# Patient Record
Sex: Male | Born: 1950 | Race: White | Hispanic: No | Smoking: Former smoker
Health system: Southern US, Community
[De-identification: ages and names within clinical notes are randomized; demographics above are authoritative.]

## PROBLEM LIST (undated history)

## (undated) DIAGNOSIS — K589 Irritable bowel syndrome without diarrhea: Secondary | ICD-10-CM

## (undated) DIAGNOSIS — E785 Hyperlipidemia, unspecified: Secondary | ICD-10-CM

## (undated) DIAGNOSIS — C4491 Basal cell carcinoma of skin, unspecified: Secondary | ICD-10-CM

## (undated) DIAGNOSIS — K219 Gastro-esophageal reflux disease without esophagitis: Secondary | ICD-10-CM

## (undated) DIAGNOSIS — K2970 Gastritis, unspecified, without bleeding: Secondary | ICD-10-CM

## (undated) DIAGNOSIS — K449 Diaphragmatic hernia without obstruction or gangrene: Secondary | ICD-10-CM

## (undated) DIAGNOSIS — K861 Other chronic pancreatitis: Secondary | ICD-10-CM

## (undated) DIAGNOSIS — I10 Essential (primary) hypertension: Secondary | ICD-10-CM

## (undated) DIAGNOSIS — K8681 Exocrine pancreatic insufficiency: Secondary | ICD-10-CM

## (undated) DIAGNOSIS — A6920 Lyme disease, unspecified: Secondary | ICD-10-CM

## (undated) DIAGNOSIS — B356 Tinea cruris: Secondary | ICD-10-CM

## (undated) HISTORY — DX: Exocrine pancreatic insufficiency: K86.81

## (undated) HISTORY — DX: Essential (primary) hypertension: I10

## (undated) HISTORY — DX: Gastritis, unspecified, without bleeding: K29.70

## (undated) HISTORY — DX: Diaphragmatic hernia without obstruction or gangrene: K44.9

## (undated) HISTORY — PX: UPPER GASTROINTESTINAL ENDOSCOPY: SHX188

## (undated) HISTORY — DX: Basal cell carcinoma of skin, unspecified: C44.91

## (undated) HISTORY — PX: EXCISIONAL HEMORRHOIDECTOMY: SHX1541

## (undated) HISTORY — PX: TONSILLECTOMY AND ADENOIDECTOMY: SHX28

## (undated) HISTORY — DX: Other chronic pancreatitis: K86.1

## (undated) HISTORY — DX: Lyme disease, unspecified: A69.20

## (undated) HISTORY — PX: COLONOSCOPY: SHX174

## (undated) HISTORY — DX: Tinea cruris: B35.6

## (undated) HISTORY — DX: Hyperlipidemia, unspecified: E78.5

## (undated) HISTORY — DX: Gastro-esophageal reflux disease without esophagitis: K21.9

---

## 2015-05-18 DIAGNOSIS — A6920 Lyme disease, unspecified: Secondary | ICD-10-CM

## 2015-05-18 HISTORY — DX: Lyme disease, unspecified: A69.20

## 2015-06-06 ENCOUNTER — Telehealth: Payer: Self-pay | Admitting: Gastroenterology

## 2015-06-06 NOTE — Telephone Encounter (Signed)
Pt called asking to speak with GF. I told him that GF was on the other line and was there something that I could help him with. Patient said that he wanted GF to call him back and gave me his contact number [phone].  Patient has an OV on 7/13 at 3 with [facility] as a new patient with [facility] for rectal discharge, but I didn't see referral from PCP or notes in epic. So, I didn't know if I needed to be requesting any before his OV or not. Please advise and call patient back.

## 2015-06-07 NOTE — Telephone Encounter (Signed)
Talked with him and he is aware of his appointment with [facility]

## 2015-06-14 ENCOUNTER — Ambulatory Visit: Payer: Self-pay | Admitting: Nurse Practitioner

## 2015-06-29 ENCOUNTER — Encounter: Payer: Self-pay | Admitting: Gastroenterology

## 2015-06-29 ENCOUNTER — Ambulatory Visit (INDEPENDENT_AMBULATORY_CARE_PROVIDER_SITE_OTHER): Payer: Medicare HMO | Admitting: Gastroenterology

## 2015-06-29 VITALS — BP 146/88 | HR 105 | Temp 97.4°F | Ht 66.0 in | Wt 172.4 lb

## 2015-06-29 DIAGNOSIS — R151 Fecal smearing: Secondary | ICD-10-CM | POA: Insufficient documentation

## 2015-06-29 DIAGNOSIS — F981 Encopresis not due to a substance or known physiological condition: Secondary | ICD-10-CM

## 2015-06-29 DIAGNOSIS — K644 Residual hemorrhoidal skin tags: Secondary | ICD-10-CM | POA: Insufficient documentation

## 2015-06-29 NOTE — Assessment & Plan Note (Signed)
DUE TO GRADE II INTERNAL HEMORRHOIDS.  DISCUSSED BENEFITS, RISKS, AND MANAGEMENT OF HEMORRHOIDS([facility] BANDING, FLEX SIG BANDING, OR SURGERY). PT INTERESTED IN [place] BANDING AND WOULD LIKE TO DISCUSS WITH PCP. PREFERS A NON-INVASIVE APPROACH. CONTINUE FIBER DRINK WATER FOLLOW UP IN 4 MOS.

## 2015-06-29 NOTE — Patient Instructions (Signed)
CALL ME AND LET ME KNOW WHAT YOU DECIDE AFTER SPEAKING WITH DR. Consuello Bossier.  AGAIN OPTIONS FOR TREATMENT INCLUDES [facility] BANDING, FLEX SIGMOIDOSCOPY WITH BANDING, OR SURGERY(HEMORHROIDECTOMY).  DRINK WATER TO KEEP YOUR URINE LIGHT YELLOW.  FOLLOW A HIGH FIBER DIET. AVOID ITEMS THAT CAUSE BLOATING & GAS.  FOLLOW UP IN 4 MOS OR SOONER ONCE YOU MAKE A DECISION ABOUT YOUR HEMORRHOIDS.

## 2015-06-29 NOTE — Progress Notes (Signed)
Subjective:    Patient ID: Ronnie Patrick, male    DOB: 07/08/1951, 64 y.o.   MRN: [id number]  No primary care provider on file.  HPI HAS FISHY ODOR IN CROTCH IF SKIPS  A SHOWER. FISHY ODOR FROM ANUS CAN BE EMBARRASSING: 2-3 TIMES A WEEK SINCE FEB 2016. ONLY THING THAT GOT RID OF IT WAS METRONIDAZOLE. HAD 2 COURSE AND AFTER 2ND COURSE: ITCHING AFTER 3 DAYS. HAS BEEN TESTED FOR STDs. ANO-RECEPTIVE MALE. NO RECTAL BLEEDING, PRESSURE, PAIN, ITCHING, BURNING. RARE SOILING. NO PAIN WITH DEFECATION. NO TRAVEL & NO ADDITIONAL ABX OTHER THAN DOXYCYCLINE OR FLAGYL. APPETITE OK. LOST 20 LBS ON PURPOSE. FIBER THERAPY DUE TO INCOMPLETE EVACUATION. GOES EVERY DAY. HAD SYPHILIS AND GOT TREATED AND TITERS GOING DOWN. WAITING ON PT ASSISTANCE WITH TRUVADA. VIRAL LOAD OK. NOT ON TRUVADA CURRENTLY-DR. DESAI. Was taking it for HIV PREVENTION.  NEVER HAD N ANAL RASH. NL FORMED STOOL & SOFTER WITH FIBER. NO NEW MEDS OR OTC SUPPLEMENTS. LUBRICANT: WET PLATINUM(SILICONE BASED-AUG 8242 & NEVER HAD PROBLEMS BEFORE). DOUCHES WITH PLAIN WATER WITH RECTAL SYRINGE. HEARTBURN OUT OF CONTROL WITH HUMMUS BUT NOW BETTER IF HE DOESN'T   PT DENIES FEVER, CHILLS, HEMATEMESIS, nausea, vomiting, melena, diarrhea, CHEST PAIN, SHORTNESS OF BREATH, CHANGE IN BOWEL IN HABITS, constipation, abdominal pain, problems swallowing, OR problems with sedation.   Past Medical History  Diagnosis Date  . Lyme disease JUN 2016  . GERD (gastroesophageal reflux disease)   . HTN (hypertension)   . Hyperlipidemia   . Jock itch     LAMISIL/TERBINAFINE REQUIRED   Past Surgical History  Procedure Laterality Date  . Colonoscopy  DEC 2015 [facility]    IH  . Upper gastrointestinal endoscopy  DEC 2015 [facility]    GERD, HH  . Tonsillectomy and adenoidectomy      AS A CHILD   Allergies  Allergen Reactions  . Flagyl [Metronidazole] Itching  . Penicillins     don't know     Current Outpatient Prescriptions  Medication Sig Dispense Refill  .  acetaminophen (TYLENOL) 500 MG tablet Take 500 mg by mouth every 6 (six) hours as needed.    . docusate sodium (COLACE) 100 MG capsule Take 100 mg by mouth 2 (two) times daily. 4 tablets at bed time    . doxycycline (MONODOX) 100 MG capsule Take 100 mg by mouth 2 (two) times daily.     . hydrochlorothiazide (HYDRODIURIL) 25 MG tablet Take 25 mg by mouth daily.     Marland Kitchen lovastatin (MEVACOR) 10 MG tablet Take 20 mg by mouth at bedtime.    . meloxicam (MOBIC) 15 MG tablet Take 15 mg by mouth daily.     Marland Kitchen omeprazole (PRILOSEC) 20 MG capsule Take 20 mg by mouth daily.     . potassium chloride SA (K-DUR,KLOR-CON) 20 MEQ tablet Take 10 mEq by mouth once.     . sildenafil (VIAGRA) 25 MG tablet Take 25 mg by mouth daily as needed for erectile dysfunction.    . terbinafine (LAMISIL) 250 MG tablet Take 125 mg by mouth 2 (two) times daily.            Family History  Problem Relation Age of Onset  . Colon cancer Neg Hx   . Colon polyps Neg Hx     History   Social History  . Marital Status: Unknown    Spouse Name: N/A  . Number of Children: N/A  . Years of Education: N/A   Social History Main Topics  .  Smoking status: Never Smoker   . Smokeless tobacco: Not on file  . Alcohol Use: 0.0 oz/week    0 Standard drinks or equivalent per week     Comment: glass of wine months ago  . Drug Use: No  . Sexual Activity: Not on file   Other Topics Concern  . None   Social History Narrative   ORIGINALLY FROM [place]. USED TO LOVE TO VACATION IN [place]: [place], [place].    JOB: RESEARCH TECHNICIAN, DEGREE IN COMPUTER SCIENCE, SOLD INSURANCE.      2 SONS: AGER 43, 37 AND THEY LIVE IN [place] WITH EX-WIFE.      RARE ETOH OR SWEETS. HAS A TWIN BROTHER WITH DIABETES.   Review of Systems PER HPI OTHERWISE ALL SYSTEMS ARE NEGATIVE.     Objective:   Physical Exam  Constitutional: He is oriented to person, place, and time. He appears well-developed and well-nourished. No distress.  HENT:  Head:  Normocephalic and atraumatic.  Mouth/Throat: Oropharynx is clear and moist. No oropharyngeal exudate.  Eyes: Pupils are equal, round, and reactive to light. No scleral icterus.  Neck: Normal range of motion. Neck supple.  Cardiovascular: Normal rate, regular rhythm and normal heart sounds.   Pulmonary/Chest: Effort normal and breath sounds normal. No respiratory distress.  Abdominal: Soft. Bowel sounds are normal. He exhibits no distension. There is no tenderness.  Genitourinary: Rectal exam shows external hemorrhoid (MODERATE) and internal hemorrhoid (GRADE III). Rectal exam shows no fissure, no mass and no tenderness.     Musculoskeletal: He exhibits no edema.  Lymphadenopathy:    He has no cervical adenopathy.  Neurological: He is alert and oriented to person, place, and time.  NO FOCAL DEFICITS   Psychiatric: He has a normal mood and affect.  Vitals reviewed.    PROCEDURE: ANOSCOPY SCOPE PLACED. REDUNDANT TISSUE IN R ANTERIOR ANDR POSTERIOR BUNDLE. NORMAL L LATERAL HEMORRHOID BUNDLE. NO MASS. FORMED STOOL IN VAULT.      Assessment & Plan:

## 2015-07-04 ENCOUNTER — Telehealth: Payer: Self-pay

## 2015-07-04 NOTE — Progress Notes (Signed)
No pcp per patient 

## 2015-07-04 NOTE — Telephone Encounter (Signed)
Pt is calling to talk with [facility] about the hemorrhoid banding. He would like for you to call him at [phone].

## 2015-07-06 ENCOUNTER — Other Ambulatory Visit: Payer: Self-pay

## 2015-07-06 DIAGNOSIS — R198 Other specified symptoms and signs involving the digestive system and abdomen: Secondary | ICD-10-CM

## 2015-07-06 NOTE — Telephone Encounter (Signed)
Pt is set up for Flex on 07/15/15 @ 1:00. Instructions are in the mail and he is aware

## 2015-07-06 NOTE — Telephone Encounter (Signed)
Called patient TO DISCUSS CONCERNS. Has lots of questions. 1. Had TCS DEC 2015-WHY FLEX SIG? 2. HOW MANY BANDS AT A TIME? 3. CAN HE DRIVE HIMSELF HOME? 4. GRADE OF HEMORRHOIDS? 5. PREP FOR CR[facility] V. FSIG? 6. STOPPING ACETA OR MELOXICAM? 7. LEAKAGE CAUSING JOCK ITCH?  WANTS TO SCHEDULE FLEX /SIG WITH HEMORRHOID BANDING JUL 29 AT 4643 PM. PT WANTS NO SEDATION. WILL ADMINISTER ENEMA IN PREOP.

## 2015-07-15 ENCOUNTER — Encounter (HOSPITAL_COMMUNITY)
Admission: RE | Disposition: A | Discharge status: Home Health | Payer: Self-pay | Source: Ambulatory Visit | Attending: Gastroenterology

## 2015-07-15 ENCOUNTER — Encounter (HOSPITAL_COMMUNITY): Payer: Self-pay | Admitting: *Deleted

## 2015-07-15 ENCOUNTER — Ambulatory Visit (HOSPITAL_COMMUNITY)
Admission: RE | Admit: 2015-07-15 | Discharge: 2015-07-15 | Disposition: A | Discharge status: Home Health | Payer: Medicare HMO | Source: Ambulatory Visit | Attending: Gastroenterology | Admitting: Gastroenterology

## 2015-07-15 DIAGNOSIS — E785 Hyperlipidemia, unspecified: Secondary | ICD-10-CM | POA: Insufficient documentation

## 2015-07-15 DIAGNOSIS — K648 Other hemorrhoids: Secondary | ICD-10-CM | POA: Diagnosis not present

## 2015-07-15 DIAGNOSIS — R151 Fecal smearing: Secondary | ICD-10-CM | POA: Diagnosis present

## 2015-07-15 DIAGNOSIS — K644 Residual hemorrhoidal skin tags: Secondary | ICD-10-CM | POA: Diagnosis not present

## 2015-07-15 DIAGNOSIS — D126 Benign neoplasm of colon, unspecified: Secondary | ICD-10-CM | POA: Insufficient documentation

## 2015-07-15 DIAGNOSIS — I10 Essential (primary) hypertension: Secondary | ICD-10-CM | POA: Diagnosis not present

## 2015-07-15 DIAGNOSIS — R198 Other specified symptoms and signs involving the digestive system and abdomen: Secondary | ICD-10-CM | POA: Insufficient documentation

## 2015-07-15 DIAGNOSIS — Z79899 Other long term (current) drug therapy: Secondary | ICD-10-CM | POA: Insufficient documentation

## 2015-07-15 DIAGNOSIS — K219 Gastro-esophageal reflux disease without esophagitis: Secondary | ICD-10-CM | POA: Insufficient documentation

## 2015-07-15 HISTORY — PX: FLEXIBLE SIGMOIDOSCOPY: SHX5431

## 2015-07-15 HISTORY — PX: HEMORRHOID BANDING: SHX5850

## 2015-07-15 SURGERY — SIGMOIDOSCOPY, FLEXIBLE
Anesthesia: Moderate Sedation

## 2015-07-15 MED ORDER — STERILE WATER FOR IRRIGATION IR SOLN
Status: DC | PRN
  Administered 2015-07-15: 13:00:00

## 2015-07-15 MED ORDER — SODIUM CHLORIDE 0.9 % IV SOLN
INTRAVENOUS | Status: DC
  Administered 2015-07-15: 12:00:00 via INTRAVENOUS

## 2015-07-15 NOTE — Op Note (Addendum)
[facility] [address] [place], [address]   FLEX SIGMOIDOSCOPY PROCEDURE REPORT  PATIENT: Ronnie Patrick, Ronnie Patrick  MR#: [id number] BIRTHDATE: 05-Mar-1951 , 55  yrs. old GENDER: male ENDOSCOPIST: Danie Binder, MD REFERRED BY: PROCEDURE DATE:  07-Aug-2015 PROCEDURE:   Sigmoidoscopy with biopsy INDICATIONS:MUCOUS AFTER A BM. MEDICATIONS: NONE  DESCRIPTION OF PROCEDURE:    Physical exam was performed.  Informed consent was obtained from the patient after explaining the benefits, risks, and alternatives to procedure.  The patient was connected to monitor and placed in left lateral position. Continuous oxygen was provided by nasal cannula and IV medicine administered through an indwelling cannula.  After administration of sedation and rectal exam, the patients rectum was intubated and the [id number] ([id number])  colonoscope was advanced under direct visualization to the cecum.  The scope was removed slowly by carefully examining the color, texture, anatomy, and integrity mucosa on the way out.  The patient was recovered in endoscopy and discharged home in satisfactory condition. Estimated blood loss is zero unless otherwise noted in this procedure report.       COLON FINDINGS: NORMAL SIGMOID COLON and Large external hemorrhoids were found.  PREP QUALITY: The overall prep quality was adequate.  COMPLICATIONS: None  ENDOSCOPIC IMPRESSION: 1.   NORMAL SIGMOID COLON 2.   MUCOUS DISCHARGE DUE TO WICKING BY Large external hemorrhoids  RECOMMENDATIONS: REFER FOR HEMORHROIDECTOMY DRINK WATER EAT FIBER AWAIT BIOPSY    _______________________________ Lorrin MaisDanie Binder, MD 07-Aug-2015 1:46 PM Revised: 07-Aug-2015 1:46 PM  CPT CODES: ICD CODES:  The ICD and CPT codes recommended by this software are interpretations from the data that the clinical staff has captured with the software.  The verification of the translation of this report to the ICD and CPT codes  and modifiers is the sole responsibility of the health care institution and practicing physician where this report was generated.  [employer] will not be held responsible for the validity of the ICD and CPT codes included on this report.  AMA assumes no liability for data contained or not contained herein. CPT is a Designer, television/film set of the [employer].

## 2015-07-15 NOTE — Discharge Instructions (Signed)
You had 2 polyps removed. You have large EXTERNAL hemorrhoids.    DRINK WATER TO KEEP YOUR URINE LIGHT YELLOW.  FOLLOW A HIGH FIBER DIET. AVOID ITEMS THAT CAUSE BLOATING & GAS. SEE INFO BELOW.  YOUR BIOPSY RESULTS WILL BE AVAILABLE IN MY CHART AFTER AUG 1 AND MY OFFICE WILL CONTACT YOU IN 10-14 DAYS WITH YOUR RESULTS.   FOLLOW UP IN 4 MOS.      ENDOSCOPY Care After Read the instructions outlined below and refer to this sheet in the next week. These discharge instructions provide you with general information on caring for yourself after you leave the hospital. While your treatment has been planned according to the most current medical practices available, unavoidable complications occasionally occur. If you have any problems or questions after discharge, call DR. Rosela Supak, [phone].  ACTIVITY  You may resume your regular activity, but move at a slower pace for the next 24 hours.   Take frequent rest periods for the next 24 hours.   Walking will help get rid of the air and reduce the bloated feeling in your belly (abdomen).   No driving for 24 hours (because of the medicine (anesthesia) used during the test).   You may shower.   Do not sign any important legal documents or operate any machinery for 24 hours (because of the anesthesia used during the test).    NUTRITION  Drink plenty of fluids.   You may resume your normal diet as instructed by your doctor.   Begin with a light meal and progress to your normal diet. Heavy or fried foods are harder to digest and may make you feel sick to your stomach (nauseated).   Avoid alcoholic beverages for 24 hours or as instructed.    MEDICATIONS  You may resume your normal medications.   WHAT YOU CAN EXPECT TODAY  Some feelings of bloating in the abdomen.   Passage of more gas than usual.   Spotting of blood in your stool or on the toilet paper  .  IF YOU HADBIOPSIES TAKEN  DURING THE SIGMOIDOSCOPY/UPPER  ENDOSCOPY:  Eat a soft diet IF YOU HAVE NAUSEA, BLOATING, ABDOMINAL PAIN, OR VOMITING.    FINDING OUT THE RESULTS OF YOUR TEST Not all test results are available during your visit. DR. Oneida Alar WILL CALL YOU WITHIN 14 DAYS OF YOUR PROCEDUE WITH YOUR RESULTS. Do not assume everything is normal if you have not heard from DR. Yari Szeliga, CALL HER OFFICE AT [phone].  SEEK IMMEDIATE MEDICAL ATTENTION AND CALL THE OFFICE: [phone] IF:  You have more than a spotting of blood in your stool.   Your belly is swollen (abdominal distention).   You are nauseated or vomiting.   You have a temperature over 101F.   You have abdominal pain or discomfort that is severe or gets worse throughout the day.    High-Fiber Diet A high-fiber diet changes your normal diet to include more whole grains, legumes, fruits, and vegetables. Changes in the diet involve replacing refined carbohydrates with unrefined foods. The calorie level of the diet is essentially unchanged. The Dietary Reference Intake (recommended amount) for adult males is 38 grams per day. For adult females, it is 25 grams per day. Pregnant and lactating women should consume 28 grams of fiber per day. Fiber is the intact part of a plant that is not broken down during digestion. Functional fiber is fiber that has been isolated from the plant to provide a beneficial effect in the body. PURPOSE  Increase  stool bulk.   Ease and regulate bowel movements.   Lower cholesterol.   REDUCE RISK OF COLON CANCER  INDICATIONS THAT YOU NEED MORE FIBER  Constipation and hemorrhoids.   Uncomplicated diverticulosis (intestine condition) and irritable bowel syndrome.   Weight management.   As a protective measure against hardening of the arteries (atherosclerosis), diabetes, and cancer.   GUIDELINES FOR INCREASING FIBER IN THE DIET  Start adding fiber to the diet slowly. A gradual increase of about 5 more grams (2 slices of whole-wheat bread, 2  servings of most fruits or vegetables, or 1 bowl of high-fiber cereal) per day is best. Too rapid an increase in fiber may result in constipation, flatulence, and bloating.   Drink enough water and fluids to keep your urine clear or pale yellow. Water, juice, or caffeine-free drinks are recommended. Not drinking enough fluid may cause constipation.   Eat a variety of high-fiber foods rather than one type of fiber.   Try to increase your intake of fiber through using high-fiber foods rather than fiber pills or supplements that contain small amounts of fiber.   The goal is to change the types of food eaten. Do not supplement your present diet with high-fiber foods, but replace foods in your present diet.   INCLUDE A VARIETY OF FIBER SOURCES  Replace refined and processed grains with whole grains, canned fruits with fresh fruits, and incorporate other fiber sources. White rice, white breads, and most bakery goods contain little or no fiber.   Brown whole-grain rice, buckwheat oats, and many fruits and vegetables are all good sources of fiber. These include: broccoli, Brussels sprouts, cabbage, cauliflower, beets, sweet potatoes, white potatoes (skin on), carrots, tomatoes, eggplant, squash, berries, fresh fruits, and dried fruits.   Cereals appear to be the richest source of fiber. Cereal fiber is found in whole grains and bran. Bran is the fiber-rich outer coat of cereal grain, which is largely removed in refining. In whole-grain cereals, the bran remains. In breakfast cereals, the largest amount of fiber is found in those with "bran" in their names. The fiber content is sometimes indicated on the label.   You may need to include additional fruits and vegetables each day.   In baking, for 1 cup white flour, you may use the following substitutions:   1 cup whole-wheat flour minus 2 tablespoons.   1/2 cup white flour plus 1/2 cup whole-wheat flour.

## 2015-07-15 NOTE — H&P (View-Only) (Signed)
Subjective:    Patient ID: Ronnie Patrick, male    DOB: 1951-07-09, 64 y.o.   MRN: [id number]  No primary care provider on file.  HPI HAS FISHY ODOR IN CROTCH IF SKIPS  A SHOWER. FISHY ODOR FROM ANUS CAN BE EMBARRASSING: 2-3 TIMES A WEEK SINCE FEB 2016. ONLY THING THAT GOT RID OF IT WAS METRONIDAZOLE. HAD 2 COURSE AND AFTER 2ND COURSE: ITCHING AFTER 3 DAYS. HAS BEEN TESTED FOR STDs. ANO-RECEPTIVE MALE. NO RECTAL BLEEDING, PRESSURE, PAIN, ITCHING, BURNING. RARE SOILING. NO PAIN WITH DEFECATION. NO TRAVEL & NO ADDITIONAL ABX OTHER THAN DOXYCYCLINE OR FLAGYL. APPETITE OK. LOST 20 LBS ON PURPOSE. FIBER THERAPY DUE TO INCOMPLETE EVACUATION. GOES EVERY DAY. HAD SYPHILIS AND GOT TREATED AND TITERS GOING DOWN. WAITING ON PT ASSISTANCE WITH TRUVADA. VIRAL LOAD OK. NOT ON TRUVADA CURRENTLY-DR. DESAI. Was taking it for HIV PREVENTION.  NEVER HAD N ANAL RASH. NL FORMED STOOL & SOFTER WITH FIBER. NO NEW MEDS OR OTC SUPPLEMENTS. LUBRICANT: WET PLATINUM(SILICONE BASED-AUG 8242 & NEVER HAD PROBLEMS BEFORE). DOUCHES WITH PLAIN WATER WITH RECTAL SYRINGE. HEARTBURN OUT OF CONTROL WITH HUMMUS BUT NOW BETTER IF HE DOESN'T   PT DENIES FEVER, CHILLS, HEMATEMESIS, nausea, vomiting, melena, diarrhea, CHEST PAIN, SHORTNESS OF BREATH, CHANGE IN BOWEL IN HABITS, constipation, abdominal pain, problems swallowing, OR problems with sedation.   Past Medical History  Diagnosis Date  . Lyme disease JUN 2016  . GERD (gastroesophageal reflux disease)   . HTN (hypertension)   . Hyperlipidemia   . Jock itch     LAMISIL/TERBINAFINE REQUIRED   Past Surgical History  Procedure Laterality Date  . Colonoscopy  DEC 2015 [facility]    IH  . Upper gastrointestinal endoscopy  DEC 2015 [facility]    GERD, HH  . Tonsillectomy and adenoidectomy      AS A CHILD   Allergies  Allergen Reactions  . Flagyl [Metronidazole] Itching  . Penicillins     don't know     Current Outpatient Prescriptions  Medication Sig Dispense Refill  .  acetaminophen (TYLENOL) 500 MG tablet Take 500 mg by mouth every 6 (six) hours as needed.    . docusate sodium (COLACE) 100 MG capsule Take 100 mg by mouth 2 (two) times daily. 4 tablets at bed time    . doxycycline (MONODOX) 100 MG capsule Take 100 mg by mouth 2 (two) times daily.     . hydrochlorothiazide (HYDRODIURIL) 25 MG tablet Take 25 mg by mouth daily.     Marland Kitchen lovastatin (MEVACOR) 10 MG tablet Take 20 mg by mouth at bedtime.    . meloxicam (MOBIC) 15 MG tablet Take 15 mg by mouth daily.     Marland Kitchen omeprazole (PRILOSEC) 20 MG capsule Take 20 mg by mouth daily.     . potassium chloride SA (K-DUR,KLOR-CON) 20 MEQ tablet Take 10 mEq by mouth once.     . sildenafil (VIAGRA) 25 MG tablet Take 25 mg by mouth daily as needed for erectile dysfunction.    . terbinafine (LAMISIL) 250 MG tablet Take 125 mg by mouth 2 (two) times daily.            Family History  Problem Relation Age of Onset  . Colon cancer Neg Hx   . Colon polyps Neg Hx     History   Social History  . Marital Status: Unknown    Spouse Name: N/A  . Number of Children: N/A  . Years of Education: N/A   Social History Main Topics  .  Smoking status: Never Smoker   . Smokeless tobacco: Not on file  . Alcohol Use: 0.0 oz/week    0 Standard drinks or equivalent per week     Comment: glass of wine months ago  . Drug Use: No  . Sexual Activity: Not on file   Other Topics Concern  . None   Social History Narrative   ORIGINALLY FROM [place]. USED TO LOVE TO VACATION IN [place]: [place], [place].    JOB: RESEARCH TECHNICIAN, DEGREE IN COMPUTER SCIENCE, SOLD INSURANCE.      2 SONS: AGER 43, 37 AND THEY LIVE IN [place] WITH EX-WIFE.      RARE ETOH OR SWEETS. HAS A TWIN BROTHER WITH DIABETES.   Review of Systems PER HPI OTHERWISE ALL SYSTEMS ARE NEGATIVE.     Objective:   Physical Exam  Constitutional: He is oriented to person, place, and time. He appears well-developed and well-nourished. No distress.  HENT:  Head:  Normocephalic and atraumatic.  Mouth/Throat: Oropharynx is clear and moist. No oropharyngeal exudate.  Eyes: Pupils are equal, round, and reactive to light. No scleral icterus.  Neck: Normal range of motion. Neck supple.  Cardiovascular: Normal rate, regular rhythm and normal heart sounds.   Pulmonary/Chest: Effort normal and breath sounds normal. No respiratory distress.  Abdominal: Soft. Bowel sounds are normal. He exhibits no distension. There is no tenderness.  Genitourinary: Rectal exam shows external hemorrhoid (MODERATE) and internal hemorrhoid (GRADE III). Rectal exam shows no fissure, no mass and no tenderness.     Musculoskeletal: He exhibits no edema.  Lymphadenopathy:    He has no cervical adenopathy.  Neurological: He is alert and oriented to person, place, and time.  NO FOCAL DEFICITS   Psychiatric: He has a normal mood and affect.  Vitals reviewed.    PROCEDURE: ANOSCOPY SCOPE PLACED. REDUNDANT TISSUE IN R ANTERIOR ANDR POSTERIOR BUNDLE. NORMAL L LATERAL HEMORRHOID BUNDLE. NO MASS. FORMED STOOL IN VAULT.      Assessment & Plan:

## 2015-07-15 NOTE — Interval H&P Note (Signed)
History and Physical Interval Note:  07/15/2015 12:51 PM  Ronnie Patrick  has presented today for surgery, with the diagnosis of rectal discharge  The various methods of treatment have been discussed with the patient and family. After consideration of risks, benefits and other options for treatment, the patient has consented to  Procedure(s) with comments: FLEXIBLE SIGMOIDOSCOPY (N/A) - 100 - moved to 12:00 - office to notify [place] (N/A) as a surgical intervention .  The patient's history has been reviewed, patient examined, no change in status, stable for surgery.  I have reviewed the patient's chart and labs.  Questions were answered to the patient's satisfaction.     [employer]

## 2015-07-17 ENCOUNTER — Encounter: Payer: Self-pay | Admitting: Gastroenterology

## 2015-07-19 ENCOUNTER — Telehealth: Payer: Self-pay

## 2015-07-19 NOTE — Telephone Encounter (Signed)
Pt is calling to talk to [facility]. He stated that he sent an e-mail through my chart and has not heard anything. I informed him that we have some people out of town this week and he understood just wanted to make sure the message came through. Please call or e-mail back when you can

## 2015-07-20 ENCOUNTER — Encounter (HOSPITAL_COMMUNITY): Payer: Self-pay | Admitting: Gastroenterology

## 2015-07-21 NOTE — Telephone Encounter (Signed)
CALLED PT ANSWERED QUESTIONS-7 DAYS TO RESUME USUAL ACTIVITY. HE WILL CALL IF HE NEEDS A SURGERY REFERRAL. DISCUSSED POLYP RESULTS. NEXT TCS IN DEC 2025. OPV E30 NOV 2016 HEMORRHOIDS.

## 2015-07-21 NOTE — Telephone Encounter (Signed)
Noted  

## 2015-07-21 NOTE — Telephone Encounter (Signed)
REMINDERS IN EPIC °

## 2015-08-04 ENCOUNTER — Telehealth: Payer: Self-pay

## 2015-08-04 NOTE — Telephone Encounter (Signed)
Pt is wanting to know if he needs to have a TCS since he had polyps with the Flex sig. To make sure there no more up higher in the colon. Please advise

## 2015-08-04 NOTE — Telephone Encounter (Signed)
PLEASE CALL PT. BECAUSE HE HAD A HYPERPLASTIC POLYP REMOVED. AND NOT A SIMPLE ADENOMA. HE DOES NOT NEED ANOTHER TCS.

## 2015-08-05 NOTE — Telephone Encounter (Signed)
Pt is aware.  

## 2015-09-28 ENCOUNTER — Encounter: Payer: Self-pay | Admitting: Gastroenterology

## 2015-11-16 ENCOUNTER — Encounter: Payer: Self-pay | Admitting: Gastroenterology

## 2015-11-16 ENCOUNTER — Ambulatory Visit (INDEPENDENT_AMBULATORY_CARE_PROVIDER_SITE_OTHER): Payer: Medicare HMO | Admitting: Gastroenterology

## 2015-11-16 VITALS — BP 140/78 | HR 90 | Temp 97.4°F | Ht 66.0 in | Wt 176.6 lb

## 2015-11-16 DIAGNOSIS — R151 Fecal smearing: Secondary | ICD-10-CM

## 2015-11-16 DIAGNOSIS — F981 Encopresis not due to a substance or known physiological condition: Secondary | ICD-10-CM

## 2015-11-16 NOTE — Progress Notes (Signed)
CC'ED TO PCP 

## 2015-11-16 NOTE — Progress Notes (Signed)
ON RECALL  °

## 2015-11-16 NOTE — Assessment & Plan Note (Addendum)
ASSOCIATED WITH A FISHY SMELL WHEN HE WIPES & MOST LIKELY DUE TO EXTERNAL HEMORRHOIDS. SYMPTOMS NOT IDEALLY CONTROLLED.  DISCUSSED PROCEDURE, BENEFITS,  AND MANAGEMENT OF HEMORRHOIDS. PT WOULD LIKE TO TRY CONSERVATIVE THERAPY. DECLINES SURGICAL REFERRAL AT THIS TIME. USE WITCH HAZEL WIPES INSTEAD OF TOILET PAPER. DRINK WATER TO KEEP YOUR URINE LIGHT YELLOW. ADD AMITIZA ONCE A DAY TO HAVE A MORE COMPLETE EVACUATION. USE 1/2 TSP DIAL SOAP IN YOUR ENEMA BULB. USE EVERY THIRD ENEMA.  FOLLOW UP IN 3 MOS.   GREATER THAN 50% WAS SPENT IN COUNSELING & COORDINATION OF CARE WITH THE PATIENT: DISCUSSED DIFFERENTIAL DIAGNOSIS, PROCEDURE, BENEFITS, AND MANAGEMENT OF HEMORRHOIDS. TOTAL ENCOUNTER TIME: 25 MINS.

## 2015-11-16 NOTE — Progress Notes (Signed)
Subjective:    Patient ID: Ronnie Patrick, male    DOB: 01-18-1951, 64 y.o.   MRN: [id number]  Pcp Not In System  HPI QUESTIONS ABOUT DISCHARGE. IT WENT AWAY AFTER SIGMOIDOSCOPY. MAY HAVE TROUBLE MOVING BOWELS. IF EATS GREEN LEAFY VEGETABLES. STOPPED EATING SPINACH. INCREASED FIBER AND CONSTIPATION BETTER. TRIED AMITIZA ONCE. HAD TO STAY HOME BECAUSE IT CAUSED LOTS OF BMs. TRIED LINZESS SAMPLES 290 MCG. DIDN'T CLEAN HIM OUT AS WELL. DESIRES AMORE COMPLETE EVACUATION.  PT DENIES FEVER, CHILLS, HEMATOCHEZIA, diarrhea, CHANGE IN BOWEL IN HABITS, constipation, abdominal pain, OR heartburn or indigestion.  Past Medical History  Diagnosis Date  . Lyme disease JUN 2016  . GERD (gastroesophageal reflux disease)   . HTN (hypertension)   . Hyperlipidemia   . Jock itch     LAMISIL/TERBINAFINE REQUIRED   Past Surgical History  Procedure Laterality Date  . Colonoscopy  DEC 2015 [facility]    IH  . Upper gastrointestinal endoscopy  DEC 2015 [facility]    GERD, HH  . Tonsillectomy and adenoidectomy      AS A CHILD  . Excisional hemorrhoidectomy      AGE 6-PAINFUL  . Flexible sigmoidoscopy N/A 07/15/2015    Procedure: FLEXIBLE SIGMOIDOSCOPY;  Surgeon: Danie Binder, MD;  [facility];  Service: Endoscopy;  Laterality: N/A;  100 - moved to 12:00 - office to notify  . Hemorrhoid banding N/A 07/15/2015    Procedure: HEMORRHOID BANDING;  Surgeon: Danie Binder, MD;  [facility];  Service: Endoscopy;  Laterality: N/A;   Allergies  Allergen Reactions  . Flagyl [Metronidazole] Itching  . Penicillins     don't know     Current Outpatient Prescriptions  Medication Sig Dispense Refill  . acetaminophen (TYLENOL) 500 MG tablet Take 500 mg by mouth 2 (two) times daily.     Marland Kitchen atorvastatin (LIPITOR) 40 MG tablet Take 40 mg by mouth daily at 6 PM.     . calcium elemental as carbonate (TUMS ULTRA 1000) 400 MG tablet Chew 3,000 mg by mouth at bedtime as needed for heartburn. RARE   .  docusate sodium (COLACE) 100 MG capsule Take 100 mg by mouth at bedtime.    . fluticasone (CUTIVATE) 0.05 % cream Apply 1 application topically daily.     . fluticasone (FLONASE) 50 MCG/ACT nasal spray Place 1 spray into both nostrils daily.    . hydrochlorothiazide (HYDRODIURIL) 25 MG tablet Take 25 mg by mouth daily.     Marland Kitchen loratadine (CLARITIN) 10 MG tablet Take 10 mg by mouth daily.    . meloxicam (MOBIC) 15 MG tablet Take 15 mg by mouth daily.     . Multiple Vitamin (MULTIVITAMIN WITH MINERALS) TABS tablet Take 1 tablet by mouth daily.    Marland Kitchen omeprazole (PRILOSEC) 20 MG capsule Take 20 mg by mouth daily.     [name] Glycol-Propyl Glycol (SYSTANE OP) Apply 1-2 drops to eye daily as needed (dry eyes).    . potassium chloride SA (K-DUR,KLOR-CON) 20 MEQ tablet Take 20 mEq by mouth daily.     Marland Kitchen senna-docusate (SENOKOT-S) 8.6-50 MG per tablet Take 4 tablets by mouth at bedtime.    . sildenafil (VIAGRA) 25 MG tablet Take 25 mg by mouth daily as needed for erectile dysfunction.    . VENTOLIN HFA 108 (90 BASE) MCG/ACT inhaler Inhale 2 puffs into the lungs every 4 (four) hours as needed for wheezing or shortness of breath.     Marland Kitchen  Review of Systems PER HPI OTHERWISE ALL SYSTEMS ARE NEGATIVE.    Objective:   Physical Exam  Constitutional: He is oriented to person, place, and time. He appears well-developed and well-nourished. No distress.  HENT:  Head: Normocephalic and atraumatic.  Mouth/Throat: Oropharynx is clear and moist. No oropharyngeal exudate.  Eyes: Pupils are equal, round, and reactive to light. No scleral icterus.  Neck: Normal range of motion. Neck supple.  Cardiovascular: Normal rate, regular rhythm and normal heart sounds.   Pulmonary/Chest: Effort normal and breath sounds normal. No respiratory distress.  Abdominal: Soft. Bowel sounds are normal. He exhibits no distension. There is no tenderness.  Genitourinary: Rectal exam shows external hemorrhoid (LARGE).      Musculoskeletal: He exhibits no edema.  Lymphadenopathy:    He has no cervical adenopathy.  Neurological: He is alert and oriented to person, place, and time.  NO FOCAL DEFICITS   Psychiatric:  SLIGHTLY ANXIOUS MOOD, NL AFFECT  Vitals reviewed.     Assessment & Plan:

## 2015-11-16 NOTE — Patient Instructions (Signed)
USE WITCH HAZEL WIPES INSTEAD OF TOILET PAPER.  DRINK WATER TO KEEP YOUR URINE LIGHT YELLOW.  ADD AMITIZA ONCE A DAY.  USE 1/2 TSP DIAL SOAP IN YOUR ENEMA BULB. USE EVERY THIRD ENEMA.   FOLLOW UP IN 3 MOS.

## 2016-01-09 ENCOUNTER — Encounter: Payer: Self-pay | Admitting: Gastroenterology

## 2016-04-05 ENCOUNTER — Encounter: Payer: Self-pay | Admitting: Gastroenterology

## 2016-04-05 ENCOUNTER — Ambulatory Visit (INDEPENDENT_AMBULATORY_CARE_PROVIDER_SITE_OTHER): Payer: Medicare HMO | Admitting: Gastroenterology

## 2016-04-05 VITALS — BP 137/82 | HR 93 | Temp 98.5°F | Ht 66.0 in | Wt 177.6 lb

## 2016-04-05 DIAGNOSIS — R198 Other specified symptoms and signs involving the digestive system and abdomen: Secondary | ICD-10-CM | POA: Diagnosis not present

## 2016-04-05 DIAGNOSIS — F981 Encopresis not due to a substance or known physiological condition: Secondary | ICD-10-CM

## 2016-04-05 DIAGNOSIS — R151 Fecal smearing: Secondary | ICD-10-CM

## 2016-04-05 NOTE — Progress Notes (Signed)
ON RECALL  °

## 2016-04-05 NOTE — Assessment & Plan Note (Signed)
REMAINS THE SAME.   CONTINUE SOAPS SUDS EVERY THIRD ONE.  USE COLACE DAILY. CONTINUE AMITIZA AS NEEDED TO HAVE A BM. SEE INFECTIOUS DISEASE TO ASSIST WITH WARTS.  FOLLOW UP IN 6 MOS.

## 2016-04-05 NOTE — Progress Notes (Signed)
Subjective:    Patient ID: Ronnie Patrick, male    DOB: 1951-02-06, 65 y.o.   MRN: [id number]  Pcp Not In System  HPI HAS HAD MANY INFECTIONS SINCE LAST VISIT. DIAGNOSED WITH YEAST GROIN INFECTION, SINUS INFECTION, HSV GENITALS, AND HPV GENITAL WARTS. PARTNER WON'T GET TESTED. HAVING UNPROTECTED SEX. NOW IN PSYCHOTHERAPY. DURING ACUTE FLARE OF GENITAL INFECTIONS, HAD BLEEDING FROM PENIS AND ANAL AREA. SEEN AT [facility](PA) AND NOW ON TRUVADA. WITCH HAZEL WIPES CAUSED HIM TO HURT AND BURN. SOLVED ISSUES BY MAKING HIS OWN WITCH HAZEL. MAKES BUTT FEEL BETTER. KEEPS STOOLS LOOSE AS POSSIBLE WITH SENNA, STOOL SOFTENER, FIBER. USING BULB WITH SOAP SUDS ENEMA EVERY 3RD ONE. NO RECEPTIVE ANAL SEX VERY RARE. BMS; DAILY. GOT AMITIZA FIELD BUT IT'S $47 PER MO ON MEDICARE. ONLY USING AMITIZA WHEN NEEDED.   HEART BURN RARE BUT CAN BE SEVERE.  PT DENIES FEVER, CHILLS,  nausea, vomiting, melena, diarrhea, CHEST PAIN,SHORTNESS OF BREATH, CHANGE IN BOWEL IN HABITS, constipation, abdominal pain, OR problems swallowing.   Past Medical History  Diagnosis Date  . Lyme disease JUN 2016  . GERD (gastroesophageal reflux disease)   . HTN (hypertension)   . Hyperlipidemia   . Jock itch     LAMISIL/TERBINAFINE REQUIRED    Past Surgical History  Procedure Laterality Date  . Colonoscopy  DEC 2015 [facility]    IH  . Upper gastrointestinal endoscopy  DEC 2015 [facility]    GERD, HH  . Tonsillectomy and adenoidectomy      AS A CHILD  . Excisional hemorrhoidectomy      AGE 86-PAINFUL  . Flexible sigmoidoscopy N/A 07/15/2015    Procedure: FLEXIBLE SIGMOIDOSCOPY;  Surgeon: Danie Binder, MD;  [facility];  Service: Endoscopy;  Laterality: N/A;  100 - moved to 12:00 - office to notify  . Hemorrhoid banding N/A 07/15/2015    Procedure: HEMORRHOID BANDING;  Surgeon: Danie Binder, MD;  [facility];  Service: Endoscopy;  Laterality: N/A;   Allergies  Allergen Reactions  . Flagyl  [Metronidazole] Itching  . Penicillins     don't know    Current Outpatient Prescriptions  Medication Sig Dispense Refill  . acetaminophen (TYLENOL) 500 MG tablet Take 500 mg by mouth 2 (two) times daily.     Marland Kitchen acyclovir (ZOVIRAX) 400 MG tablet Take 400 mg by mouth 2 (two) times daily.    Marland Kitchen atorvastatin (LIPITOR) 40 MG tablet Take 40 mg by mouth daily at 6 PM.     . docusate sodium (COLACE) 100 MG capsule Take 100 mg by mouth at bedtime.    Marland Kitchen emtricitabine-tenofovir (TRUVADA) 200-300 MG tablet Take 1 tablet by mouth daily.    . fluticasone (FLONASE) 50 MCG/ACT nasal spray Place 1 spray into both nostrils daily.    . hydrochlorothiazide (HYDRODIURIL) 25 MG tablet Take 25 mg by mouth daily.     Marland Kitchen loratadine (CLARITIN) 10 MG tablet Take 10 mg by mouth daily.    Marland Kitchen lubiprostone (AMITIZA) 24 MCG capsule Take 24 mcg by mouth daily. As needed    . meloxicam (MOBIC) 15 MG tablet Take 15 mg by mouth daily.     . Multiple Vitamin TABS tablet Take 1 tablet by mouth daily.    Marland Kitchen omeprazole (PRILOSEC) 20 MG capsule Take 20 mg by mouth daily.     [name] Glycol-Propyl Glycol (SYSTANE OP) Apply 1-2 drops to eye daily as needed (dry eyes).    . potassium chloride SA (K-DUR,KLOR-CON)  20 MEQ tablet Take 20 mEq by mouth daily.     Marland Kitchen senna-docusate (SENOKOT-S) 8.6-50 MG per tablet Take 4 tablets by mouth at bedtime.    . sildenafil (VIAGRA) 25 MG tablet Take 25 mg by mouth daily as needed for erectile dysfunction.    . VENTOLIN HFA 108 (90 BASE) MCG/ACT inhaler Inhale 2 puffs Q4H PRN wheezing or shortness of breath.     . TUMS ULTRA 1000 400 MG tablet Chew 3,000 mg by mouth at bedtime PRN for heartburn.     . fluticasone (CUTIVATE) 0.05 % cream Apply 1 application topically daily. Reported on 04/05/2016    . lovastatin (MEVACOR) 10 MG tablet Take 20 mg by mouth at bedtime. Reported on 04/05/2016     Review of Systems PER HPI OTHERWISE ALL SYSTEMS ARE NEGATIVE.    Objective:   Physical Exam    Constitutional: He is oriented to person, place, and time. He appears well-developed and well-nourished. No distress.  HENT:  Head: Normocephalic and atraumatic.  Mouth/Throat: Oropharynx is clear and moist. No oropharyngeal exudate.  Eyes: Pupils are equal, round, and reactive to light. No scleral icterus.  Neck: Normal range of motion. Neck supple.  Cardiovascular: Normal rate, regular rhythm and normal heart sounds.   Pulmonary/Chest: Effort normal and breath sounds normal. No respiratory distress.  Abdominal: Soft. Bowel sounds are normal. He exhibits no distension. There is no tenderness.  Musculoskeletal: He exhibits no edema.  Lymphadenopathy:    He has no cervical adenopathy.  Neurological: He is alert and oriented to person, place, and time.  Psychiatric: He has a normal mood and affect.  Vitals reviewed.     Assessment & Plan:

## 2016-04-05 NOTE — Assessment & Plan Note (Signed)
DUE TO EXTERNAL HEMORRHOIDS.  CONTINUE SOAPS SUDS EVERY THIRD ONE.  USE COLACE DAILY. CONTINUE AMITIZA. SEE INFECTIOUS DISEASE TO ASSIST WITH WARTS.  FOLLOW UP IN 6 MOS.

## 2016-04-05 NOTE — Progress Notes (Signed)
CC'ED TO PCP 

## 2016-04-05 NOTE — Patient Instructions (Signed)
CONTINUE SOAPS SUDS EVERY THIRD ONE.   USE COLACE DAILY.  CONTINUE AMITIZA.  SEE INFECTIOUS DISEASE TO ASSIST WITH WARTS.   FOLLOW UP IN 6 MOS.

## 2016-08-16 ENCOUNTER — Encounter: Payer: Self-pay | Admitting: Gastroenterology

## 2016-11-06 ENCOUNTER — Telehealth: Payer: Self-pay

## 2016-11-06 MED ORDER — LIDOCAINE VISCOUS 2 % MT SOLN: OROMUCOSAL | 1 refills | Status: DC

## 2016-11-06 NOTE — Telephone Encounter (Signed)
Pt called back and he is going to come in tomorrow to see AB

## 2016-11-06 NOTE — Telephone Encounter (Signed)
PLEASE CALL PT. If he has pill esophagitis WHICH CAUSES PAIN WITH SWALLOWING, I will send a Rx for viscous lidocaine to his pharmacy. IF HE IS HAVING SUBSTERNAL CHEST PAIN HE SHOULD SEE A CARDIOLOGIST OR GO TO THE ED.

## 2016-11-06 NOTE — Telephone Encounter (Signed)
Pt called to get an appointment with [facility]. He is having pain behind his breast bone. His PCP think he has an esophageal ulcer. She stop his meloxicam on 10/24/16. He is having GERD still and is taking Tums with no help. Please advise. His call back number is  [phone]

## 2016-11-07 ENCOUNTER — Encounter: Payer: Self-pay | Admitting: Gastroenterology

## 2016-11-07 ENCOUNTER — Ambulatory Visit (INDEPENDENT_AMBULATORY_CARE_PROVIDER_SITE_OTHER): Payer: Medicare HMO | Admitting: Gastroenterology

## 2016-11-07 DIAGNOSIS — K219 Gastro-esophageal reflux disease without esophagitis: Secondary | ICD-10-CM | POA: Diagnosis not present

## 2016-11-07 NOTE — Assessment & Plan Note (Signed)
65 year old with refractory symptoms, bloating, occasional nausea, and choking intermittently but without true esophageal dysphagia. Reports a globus-type sensation. Recently stopped Meloxicam. Trial Dexilant, start viscous lidocaine as recommended by Dr. Oneida Alar, and discussed possibility of pursing EGD in near future if no significant improvement with dietary and behavior modification and PPI change. He states understanding regarding this. Continue to avoid NSAIDs. As of note, he does not appear to have any concerning signs that would point towards a cardiac etiology; however, I discussed with him signs/symptoms that would necessitate urgent medical evaluation. Patient to call next week with update.

## 2016-11-07 NOTE — Progress Notes (Addendum)
REVIEWED.  Referring Provider: Lewanda Rife, DO  Primary GI: Dr. Oneida Alar   Chief Complaint  Patient presents with  . Gastroesophageal Reflux    chest pain, went to PCP (?esophageal ulcer)  . Hiatal Hernia    HPI:   Ronnie Patrick is a 65 y.o. male presenting today with a history of GERD, last seen in April 2017. Followed by Dr. Oneida Alar as well for hemorrhoids.    Went to PCP 2 weeks ago yesterday. Has history of chronic GERD. Started having burning pain behind breastbone, occasional nausea, indigestion, increased reflux, bloating, less appetite, unintentional weight loss. Occasional dry cough, sour taste in mouth. Choking sensation intermittently. Denies true esophageal dysphagia. Burping after eating/drinking. Hiccups noted. Abdominal pain in epigastric region, full feeling in epigastric region. Gurgling in stomach unrelated to bowel habits. Feeling of mucus stuck in throat and constant need to swallow. New onset hoarseness. Sneezing makes pain worse. Raw tomatoes, crystal light, wine makes worse. Drinks almost a gallon of water a day. Prilosec has been increased to 40 mg daily, which seemed to help. Taken off Meloxicam on 10/24/16, which he had taken for 2 years. Was in a drug study in [place] and was told he would not know what drug it was until after the study was over. Was in a blinded study and had been given either Ibuprofen, Celebrex, or Naproxen. No radiating chest pain or shortness of breath.    Takes Amitiza BID for constipation. Hasn't started the viscous lidocaine yet as he just picked it up. States he has done better with Nexium in the past.   Past Medical History:  Diagnosis Date  . GERD (gastroesophageal reflux disease)   . HTN (hypertension)   . Hyperlipidemia   . Jock itch    LAMISIL/TERBINAFINE REQUIRED  . Lyme disease JUN 2016    Past Surgical History:  Procedure Laterality Date  . COLONOSCOPY  DEC 2015 [facility]   IH  . EXCISIONAL HEMORRHOIDECTOMY     AGE  67-PAINFUL  . FLEXIBLE SIGMOIDOSCOPY N/A 07/15/2015   Procedure: FLEXIBLE SIGMOIDOSCOPY;  Surgeon: Danie Binder, MD;  [facility];  Service: Endoscopy;  Laterality: N/A;  100 - moved to 12:00 - office to notify  . HEMORRHOID BANDING N/A 07/15/2015   Procedure: HEMORRHOID BANDING;  Surgeon: Danie Binder, MD;  [facility];  Service: Endoscopy;  Laterality: N/A;  . TONSILLECTOMY AND ADENOIDECTOMY     AS A CHILD  . UPPER GASTROINTESTINAL ENDOSCOPY  DEC 2015 [facility]   GERD, HH    Current Outpatient Prescriptions  Medication Sig Dispense Refill  . acetaminophen (TYLENOL) 500 MG tablet Take 500 mg by mouth 2 (two) times daily.     Marland Kitchen acyclovir (ZOVIRAX) 400 MG tablet Take 400 mg by mouth 2 (two) times daily.    Marland Kitchen amLODipine (NORVASC) 10 MG tablet Take 1 tablet by mouth daily.    Marland Kitchen atorvastatin (LIPITOR) 40 MG tablet Take 40 mg by mouth daily at 6 PM.     . calcium elemental as carbonate (TUMS ULTRA 1000) 400 MG tablet Chew 3,000 mg by mouth at bedtime as needed for heartburn. Reported on 04/05/2016    . docusate sodium (COLACE) 100 MG capsule Take 100 mg by mouth at bedtime.    Marland Kitchen emtricitabine-tenofovir (TRUVADA) 200-300 MG tablet Take 1 tablet by mouth daily.    . fluticasone (CUTIVATE) 0.05 % cream Apply 1 application topically daily. Reported on 04/05/2016    . fluticasone (FLONASE) 50 MCG/ACT nasal spray  Place 1 spray into both nostrils daily.    Marland Kitchen loratadine (CLARITIN) 10 MG tablet Take 10 mg by mouth daily.    Marland Kitchen lubiprostone (AMITIZA) 24 MCG capsule Take 24 mcg by mouth 2 (two) times daily. As needed     . Multiple Vitamin (MULTIVITAMIN WITH MINERALS) TABS tablet Take 1 tablet by mouth daily.    Marland Kitchen omeprazole (PRILOSEC) 20 MG capsule Take 40 mg by mouth at bedtime.     [name] Glycol-Propyl Glycol (SYSTANE OP) Apply 1-2 drops to eye daily as needed (dry eyes).    Marland Kitchen senna-docusate (SENOKOT-S) 8.6-50 MG per tablet Take 4 tablets by mouth at bedtime.    . sildenafil  (VIAGRA) 25 MG tablet Take 25 mg by mouth daily as needed for erectile dysfunction.    . VENTOLIN HFA 108 (90 BASE) MCG/ACT inhaler Inhale 2 puffs into the lungs every 4 (four) hours as needed for wheezing or shortness of breath.     . hydrochlorothiazide (HYDRODIURIL) 25 MG tablet Take 25 mg by mouth daily.     Marland Kitchen lamoTRIgine (LAMICTAL) 100 MG tablet Take 100 mg by mouth daily.    Marland Kitchen lidocaine (XYLOCAINE) 2 % solution 2 TSP  PO 30 MINS QAC AND HS PRN FOR CHEST PAIN. MAY REPEAT DOSE EVERY 4 HOURS. NO MORE THAN 8 DOSES A DAY. (Patient not taking: Reported on 11/07/2016) 300 mL 1  . lovastatin (MEVACOR) 10 MG tablet Take 20 mg by mouth at bedtime. Reported on 04/05/2016    . meloxicam (MOBIC) 15 MG tablet Take 15 mg by mouth daily.     . potassium chloride SA (K-DUR,KLOR-CON) 20 MEQ tablet Take 20 mEq by mouth daily.      No current facility-administered medications for this visit.     Allergies as of 11/07/2016 - Review Complete 11/07/2016  Allergen Reaction Noted  . Flagyl [metronidazole] Itching 06/29/2015  . Penicillins  06/29/2015    Family History  Problem Relation Age of Onset  . Colon cancer Neg Hx   . Colon polyps Neg Hx     Social History   Social History  . Marital status: Divorced    Spouse name: N/A  . Number of children: N/A  . Years of education: N/A   Social History Main Topics  . Smoking status: Former Smoker    Packs/day: 1.00    Years: 34.00    Quit date: 07/14/2001  . Smokeless tobacco: None     Comment: Quit in 2002  . Alcohol use 0.0 oz/week     Comment: glass of wine months ago  . Drug use: No  . Sexual activity: Not Asked   Other Topics Concern  . None   Social History Narrative   ORIGINALLY FROM [place]. USED TO LOVE TO VACATION IN [place]: [place], [place].    JOB: RESEARCH TECHNICIAN, DEGREE IN COMPUTER SCIENCE, SOLD INSURANCE.      2 SONS: AGER 43, 37 AND THEY LIVE IN [place] WITH EX-WIFE.      RARE ETOH OR SWEETS. HAS A TWIN BROTHER WITH DIABETES.      Review of Systems: As mentioned in HPI   Physical Exam: BP 130/88   Pulse 93   Temp 98 F (36.7 C) (Oral)   Ht 5\' 6"  (1.676 m)   Wt 174 lb 3.2 oz (79 kg)   BMI 28.12 kg/m  General:   Alert and oriented. No distress noted. Pleasant and cooperative.  Head:  Normocephalic and atraumatic. Eyes:  Conjuctiva clear without scleral  icterus. Mouth:  Oral mucosa pink and moist. Good dentition. No lesions. Heart:  S1, S2 present without murmurs, rubs, or gallops. Regular rate and rhythm. Abdomen:  +BS, soft, non-tender and non-distended. No rebound or guarding. No HSM or masses noted. Msk:  Symmetrical without gross deformities. Normal posture. Extremities:  Without edema. Neurologic:  Alert and  oriented x4;  grossly normal neurologically. Psych:  Alert and cooperative. Normal mood and affect.

## 2016-11-07 NOTE — Patient Instructions (Addendum)
Start taking Dexilant once each day instead of Prilosec. Take the lidocaine as outlined on the prescription. Call us next week to give an update.  Happy Thanksgiving!    Food Choices for Gastroesophageal Reflux Disease, Adult    When you have gastroesophageal reflux disease (GERD), the foods you eat and your eating habits are very important. Choosing the right foods can help ease your discomfort. What guidelines do I need to follow?  Choose fruits, vegetables, whole grains, and low-fat dairy products.  Choose low-fat meat, fish, and poultry.  Limit fats such as oils, salad dressings, butter, nuts, and avocado.  Keep a food diary. This helps you identify foods that cause symptoms.  Avoid foods that cause symptoms. These may be different for everyone.  Eat small meals often instead of 3 large meals a day.  Eat your meals slowly, in a place where you are relaxed.  Limit fried foods.  Cook foods using methods other than frying.  Avoid drinking alcohol.  Avoid drinking large amounts of liquids with your meals.  Avoid bending over or lying down until 2-3 hours after eating. What foods are not recommended? These are some foods and drinks that may make your symptoms worse: Vegetables  Tomatoes. Tomato juice. Tomato and spaghetti sauce. Chili peppers. Onion and garlic. Horseradish. Fruits  Oranges, grapefruit, and lemon (fruit and juice). Meats  High-fat meats, fish, and poultry. This includes hot dogs, ribs, ham, sausage, salami, and bacon. Dairy  Whole milk and chocolate milk. Sour cream. Cream. Butter. Ice cream. Cream cheese. Drinks  Coffee and tea. Bubbly (carbonated) drinks or energy drinks. Condiments  Hot sauce. Barbecue sauce. Sweets/Desserts  Chocolate and cocoa. Donuts. Peppermint and spearmint. Fats and Oils  High-fat foods. This includes [place] fries and potato chips. Other  Vinegar. Strong spices. This includes black pepper, white pepper, red pepper,  cayenne, curry powder, cloves, ginger, and chili powder. The items listed above may not be a complete list of foods and drinks to avoid. Contact your dietitian for more information.  This information is not intended to replace advice given to you by your health care provider. Make sure you discuss any questions you have with your health care provider. Document Released: 06/03/2012 Document Revised: 05/10/2016 Document Reviewed: 10/07/2013 Elsevier Interactive Patient Education  2017 [employer].

## 2016-11-07 NOTE — Telephone Encounter (Signed)
Pt had appointment today.

## 2016-11-12 NOTE — Progress Notes (Signed)
CC'D TO PCP °

## 2016-11-13 ENCOUNTER — Telehealth: Payer: Self-pay | Admitting: Gastroenterology

## 2016-11-13 MED ORDER — DEXLANSOPRAZOLE 60 MG PO CPDR: 60.0000 mg | DELAYED_RELEASE_CAPSULE | Freq: Every day | ORAL | 3 refills | Status: DC

## 2016-11-13 NOTE — Telephone Encounter (Signed)
Pt is aware. Routing to [place] to schedule in 3 months.

## 2016-11-13 NOTE — Telephone Encounter (Signed)
I sent Dexilant in. I am SO glad he is doing well!  He should follow-up in 3 months unless something changes.

## 2016-11-13 NOTE — Telephone Encounter (Signed)
I called pt and he said that he is about 1000 % better!!! He is taking the Dexilant and he took the Lidocaine for several days and has not had to take any since. Hardly any chest pain now. He needs prescription for the Dexilant sent to his pharmacy. Also, would like to know what he should follow up.

## 2016-11-13 NOTE — Telephone Encounter (Signed)
Pt called asking to speak with AB. I told him that AB was with patients and offered to take a message. He would like to tell her how he is doing since his last OV. He can be reached at [phone] and leave a message in case he doesn't answer

## 2016-11-13 NOTE — Addendum Note (Signed)
Addended by: Annitta Needs on: 11/13/2016 04:36 PM   Modules accepted: Orders

## 2016-11-14 NOTE — Telephone Encounter (Signed)
PUT ON RECALL FOR [facility]

## 2016-11-15 ENCOUNTER — Telehealth: Payer: Self-pay

## 2016-11-15 MED ORDER — PANTOPRAZOLE SODIUM 40 MG PO TBEC: 40.0000 mg | DELAYED_RELEASE_TABLET | Freq: Every day | ORAL | 3 refills | Status: DC

## 2016-11-15 NOTE — Telephone Encounter (Signed)
I have sent in Protonix. This should be covered.

## 2016-11-15 NOTE — Telephone Encounter (Signed)
I called the pharmacy and that is not the issue. It is not on formulary and they are requesting him to try Omeprazole.

## 2016-11-15 NOTE — Telephone Encounter (Signed)
Pt is aware.  

## 2016-11-15 NOTE — Telephone Encounter (Signed)
Does it need a PA?

## 2016-11-15 NOTE — Telephone Encounter (Signed)
Received a drug change request from [facility] in [place], [place].  Dexilant is not covered by the pt's insurance, please send in an alternative.

## 2016-11-15 NOTE — Telephone Encounter (Signed)
He was on Prilosec originally. Are there any other alternatives he could try? He has failed Prilosec.

## 2016-11-20 ENCOUNTER — Other Ambulatory Visit: Payer: Self-pay

## 2016-11-21 ENCOUNTER — Ambulatory Visit: Payer: Medicare HMO | Admitting: Gastroenterology

## 2016-11-22 MED ORDER — DEXLANSOPRAZOLE 60 MG PO CPDR: 60.0000 mg | DELAYED_RELEASE_CAPSULE | Freq: Every day | ORAL | 3 refills | Status: DC

## 2016-12-12 ENCOUNTER — Encounter: Payer: Self-pay | Admitting: Gastroenterology

## 2017-02-13 ENCOUNTER — Encounter: Payer: Self-pay | Admitting: Gastroenterology

## 2017-02-13 ENCOUNTER — Ambulatory Visit (INDEPENDENT_AMBULATORY_CARE_PROVIDER_SITE_OTHER): Payer: Medicare HMO | Admitting: Gastroenterology

## 2017-02-13 ENCOUNTER — Other Ambulatory Visit: Payer: Self-pay

## 2017-02-13 VITALS — BP 119/79 | HR 102 | Temp 97.9°F | Ht 66.0 in | Wt 166.2 lb

## 2017-02-13 DIAGNOSIS — K219 Gastro-esophageal reflux disease without esophagitis: Secondary | ICD-10-CM

## 2017-02-13 DIAGNOSIS — R1013 Epigastric pain: Secondary | ICD-10-CM

## 2017-02-13 NOTE — Assessment & Plan Note (Addendum)
66 year old male with persistent dyspepsia, globus sensation, and weight loss from Nov 2017. Taking Prilosec BID. Last seen Nov 2017 and had trialed Dexilant with great improvement, yet presents today stating symptoms quickly returned and now associated with weight loss. As per plan, need to proceed with EGD.   Proceed with upper endoscopy in the near future with Dr. Oneida Alar. The risks, benefits, and alternatives have been discussed in detail with patient. They have stated understanding and desire to proceed.  Phenergan 25 mg IV on call  Continue Prilosec BID Increase Amitiza to BID as once daily dosing is not adequately treating constipation

## 2017-02-13 NOTE — Progress Notes (Addendum)
REVIEWED-NO ADDITIONAL RECOMMENDATIONS.  Referring Provider: No ref. provider found Primary Care Physician:  Pcp Not In System Primary GI: Dr. Oneida Alar   Chief Complaint  Patient presents with  . ulcer    f/u, hurts mid chest  . Abdominal Pain    mid upper abd, also has hiatal hernia  . Gastroesophageal Reflux  . Weight Loss    HPI:   Ronnie Patrick is a 66 y.o. male presenting today with a history of GERD, bloating, nausea, globus-sensation. Last seen in Nov 2017 and recommended trial of Dexilant and pursue EGD if no improvement. Avoid NSAIDs. Dexilant was not covered by insurance and put on Protonix.   Itching behind his scalp, tops of foot. Thought it was related to Protonix. Hasn't made any difference changing to Prilosec. Taking Prilosec BID. At night gets a choking sensation occasionally. Lots of burping, hiccups. Drinking water and eating causes burping. Unintentional weight loss. Down 8 lbs from Nov 2017. Had constipation with Lamictal and Norvasc and stopped these several weeks ago. Used to wake up in the morning and had a productive bowel movement. Now with hard stools in the morning and then gets softer during the day. Appetite is decreased. No rectal bleeding. Amitiza once day now, dropped it down from BID to once daily.       Past Medical History:  Diagnosis Date  . GERD (gastroesophageal reflux disease)   . HTN (hypertension)   . Hyperlipidemia   . Jock itch    LAMISIL/TERBINAFINE REQUIRED  . Lyme disease JUN 2016    Past Surgical History:  Procedure Laterality Date  . COLONOSCOPY  DEC 2015 [facility]   [facility]  . EXCISIONAL HEMORRHOIDECTOMY     AGE 76-PAINFUL  . FLEXIBLE SIGMOIDOSCOPY N/A 07/15/2015   Procedure: FLEXIBLE SIGMOIDOSCOPY;  Surgeon: Danie Binder, MD;  [facility];  Service: Endoscopy;  Laterality: N/A;  100 - moved to 12:00 - office to notify  . HEMORRHOID BANDING N/A 07/15/2015   Procedure: HEMORRHOID BANDING;  Surgeon: Danie Binder, MD;   [facility]: [facility];  Service: Endoscopy;  Laterality: N/A;  . TONSILLECTOMY AND ADENOIDECTOMY     AS A CHILD  . UPPER GASTROINTESTINAL ENDOSCOPY  DEC 2015 [facility]   GERD, [facility]    Current Outpatient Prescriptions  Medication Sig Dispense Refill  . acetaminophen (TYLENOL) 500 MG tablet Take 500 mg by mouth 2 (two) times daily.     Marland Kitchen acyclovir (ZOVIRAX) 400 MG tablet Take 400 mg by mouth 2 (two) times daily.    Marland Kitchen atorvastatin (LIPITOR) 40 MG tablet Take 40 mg by mouth daily at 6 PM.     . calcium elemental as carbonate (TUMS ULTRA 1000) 400 MG tablet Chew 3,000 mg by mouth at bedtime as needed for heartburn. Reported on 04/05/2016    . docusate sodium (COLACE) 100 MG capsule Take 100 mg by mouth at bedtime.    Marland Kitchen emtricitabine-tenofovir (TRUVADA) 200-300 MG tablet Take 1 tablet by mouth daily.    . fluticasone (CUTIVATE) 0.05 % cream Apply 1 application topically daily. Reported on 04/05/2016    . fluticasone (FLONASE) 50 MCG/ACT nasal spray Place 1 spray into both nostrils daily.    . hydrochlorothiazide (HYDRODIURIL) 25 MG tablet Take 1 tablet by mouth daily.    Marland Kitchen lidocaine (XYLOCAINE) 2 % solution 2 TSP  PO 30 MINS QAC AND HS PRN FOR CHEST PAIN. MAY REPEAT DOSE EVERY 4 HOURS. NO MORE THAN 8 DOSES A DAY. (Patient taking differently: as  needed. 2 TSP  PO 30 MINS QAC AND HS PRN FOR CHEST PAIN. MAY REPEAT DOSE EVERY 4 HOURS. NO MORE THAN 8 DOSES A DAY.) 300 mL 1  . loratadine (CLARITIN) 10 MG tablet Take 10 mg by mouth daily.    Marland Kitchen lubiprostone (AMITIZA) 24 MCG capsule Take 24 mcg by mouth daily. As needed     . Multiple Vitamin (MULTIVITAMIN WITH MINERALS) TABS tablet Take 1 tablet by mouth daily.    Marland Kitchen omeprazole (PRILOSEC) 20 MG capsule Take 40 mg by mouth at bedtime.     [name] Glycol-Propyl Glycol (SYSTANE OP) Apply 1-2 drops to eye daily as needed (dry eyes).    . Potassium Chloride ER 20 MEQ TBCR Take 1 tablet by mouth daily.    Marland Kitchen senna-docusate (SENOKOT-S) 8.6-50 MG per tablet Take 4  tablets by mouth at bedtime.    . sildenafil (VIAGRA) 25 MG tablet Take 25 mg by mouth daily as needed for erectile dysfunction.    . VENTOLIN HFA 108 (90 BASE) MCG/ACT inhaler Inhale 2 puffs into the lungs every 4 (four) hours as needed for wheezing or shortness of breath.     Marland Kitchen amLODipine (NORVASC) 10 MG tablet Take 1 tablet by mouth daily.    Marland Kitchen dexlansoprazole (DEXILANT) 60 MG capsule Take 1 capsule (60 mg total) by mouth daily. (Patient not taking: Reported on 02/13/2017) 90 capsule 3  . lamoTRIgine (LAMICTAL) 100 MG tablet Take 100 mg by mouth daily.    . pantoprazole (PROTONIX) 40 MG tablet Take 1 tablet (40 mg total) by mouth daily. (Patient not taking: Reported on 02/13/2017) 90 tablet 3   No current facility-administered medications for this visit.     Allergies as of 02/13/2017 - Review Complete 02/13/2017  Allergen Reaction Noted  . Flagyl [metronidazole] Itching 06/29/2015  . Penicillins  06/29/2015    Family History  Problem Relation Age of Onset  . Colon cancer Neg Hx   . Colon polyps Neg Hx     Social History   Social History  . Marital status: Divorced    Spouse name: N/A  . Number of children: N/A  . Years of education: N/A   Social History Main Topics  . Smoking status: Former Smoker    Packs/day: 1.00    Years: 34.00    Quit date: 07/14/2001  . Smokeless tobacco: Never Used     Comment: Quit in 2002  . Alcohol use 0.0 oz/week     Comment: glass of wine months ago  . Drug use: No  . Sexual activity: Not Asked   Other Topics Concern  . None   Social History Narrative   ORIGINALLY FROM [place]. USED TO LOVE TO VACATION IN [place]: [place], [place].    JOB: RESEARCH TECHNICIAN, DEGREE IN COMPUTER SCIENCE, SOLD INSURANCE.      2 SONS: AGER 43, 37 AND THEY LIVE IN [place] WITH EX-WIFE.      RARE ETOH OR SWEETS. HAS A TWIN BROTHER WITH DIABETES.    Review of Systems: As mentioned in HPI   Physical Exam: BP 119/79   Pulse (!) 102   Temp 97.9 F (36.6 C)  (Oral)   Ht 5\' 6"  (1.676 m)   Wt 166 lb 3.2 oz (75.4 kg)   BMI 26.83 kg/m  General:   Alert and oriented. No distress noted. Pleasant and cooperative.  Head:  Normocephalic and atraumatic. Eyes:  Conjuctiva clear without scleral icterus. Mouth:  Oral mucosa pink and moist.  Heart:  S1, S2 present without murmurs, rubs, or gallops. Regular rate and rhythm. Abdomen:  +BS, soft, non-tender and non-distended. No rebound or guarding. No HSM or masses noted. Msk:  Symmetrical without gross deformities. Normal posture. Extremities:  Without edema. Neurologic:  Alert and  oriented x4;  grossly normal neurologically. Psych:  Alert and cooperative. Normal mood and affect.

## 2017-02-13 NOTE — Patient Instructions (Signed)
Continue Prilosec for now.  Increase Amitiza up to twice a day.   We have scheduled you for an upper endoscopy with Dr. Oneida Alar in the near future to get to the bottom of this!  I will see what your primary care doctor ordered regarding labs. We may need to do further labs.

## 2017-02-18 ENCOUNTER — Telehealth: Payer: Self-pay | Admitting: Gastroenterology

## 2017-02-18 NOTE — Telephone Encounter (Signed)
Contacted patient. Does not want hemorrhoidectomy. Just wanted to inform me about the discharge and wondered if it was related to his abdominal pain. I told him this is unlikely.

## 2017-02-18 NOTE — Progress Notes (Signed)
NO PCP

## 2017-02-18 NOTE — Telephone Encounter (Signed)
I called pt and he would like to know if Roseanne Kaufman, NP was ever able to get in touch with his PCP and if he needs labs for liver tests. Michela Pitcher he has itching and he had discussed with Vicente Males).   Also, he wants Vicente Males to know did have H Pylori testing with his PCP and it was negative.   He also said that he has an anal discharge with a fishy smell and Dr. Oneida Alar had him doing some enemas.   However, 2 days ago when he used the toilet, he had a big glob of mucous that was dark yellow to light brown.   He would like to hear back from [place] when she is available.

## 2017-02-18 NOTE — Telephone Encounter (Signed)
Pt called asking to speak with AB. I told him AB was with patients and I could take a message. He asked to be transferred to nurse's VM. [phone]

## 2017-02-18 NOTE — Telephone Encounter (Signed)
Labs with Tbili 0.5, AST 40, ALT 46, Alk Phos 98. H.pylori serology negative.   Looks like he is referring soap suds enemas. I believe this can be gotten over the counter. Would monitor any further rectal discharge. He has had this before, with mucous after BMs and had flex sig July 2016. Large external hemorrhoids at that time. Still would recommend hemorrhoidectomy as previously offered for referral by Dr. Fields.  

## 2017-02-19 NOTE — Telephone Encounter (Signed)
Noted  

## 2017-02-20 ENCOUNTER — Encounter (HOSPITAL_COMMUNITY)
Admission: RE | Disposition: A | Discharge status: Home / Self Care | Payer: Self-pay | Source: Ambulatory Visit | Attending: Gastroenterology

## 2017-02-20 ENCOUNTER — Ambulatory Visit (HOSPITAL_COMMUNITY)
Admission: RE | Admit: 2017-02-20 | Discharge: 2017-02-20 | Disposition: A | Discharge status: Home / Self Care | Payer: Medicare HMO | Source: Ambulatory Visit | Attending: Gastroenterology | Admitting: Gastroenterology

## 2017-02-20 ENCOUNTER — Encounter (HOSPITAL_COMMUNITY): Payer: Self-pay | Admitting: *Deleted

## 2017-02-20 DIAGNOSIS — E785 Hyperlipidemia, unspecified: Secondary | ICD-10-CM | POA: Insufficient documentation

## 2017-02-20 DIAGNOSIS — I10 Essential (primary) hypertension: Secondary | ICD-10-CM | POA: Diagnosis not present

## 2017-02-20 DIAGNOSIS — K449 Diaphragmatic hernia without obstruction or gangrene: Secondary | ICD-10-CM | POA: Insufficient documentation

## 2017-02-20 DIAGNOSIS — K219 Gastro-esophageal reflux disease without esophagitis: Secondary | ICD-10-CM | POA: Insufficient documentation

## 2017-02-20 DIAGNOSIS — R079 Chest pain, unspecified: Secondary | ICD-10-CM | POA: Diagnosis not present

## 2017-02-20 DIAGNOSIS — R1013 Epigastric pain: Secondary | ICD-10-CM | POA: Diagnosis not present

## 2017-02-20 DIAGNOSIS — Z87891 Personal history of nicotine dependence: Secondary | ICD-10-CM | POA: Insufficient documentation

## 2017-02-20 DIAGNOSIS — Z7951 Long term (current) use of inhaled steroids: Secondary | ICD-10-CM | POA: Diagnosis not present

## 2017-02-20 DIAGNOSIS — Z79899 Other long term (current) drug therapy: Secondary | ICD-10-CM | POA: Diagnosis not present

## 2017-02-20 DIAGNOSIS — K317 Polyp of stomach and duodenum: Secondary | ICD-10-CM | POA: Diagnosis not present

## 2017-02-20 DIAGNOSIS — R1310 Dysphagia, unspecified: Secondary | ICD-10-CM

## 2017-02-20 DIAGNOSIS — K2970 Gastritis, unspecified, without bleeding: Secondary | ICD-10-CM

## 2017-02-20 HISTORY — PX: ESOPHAGOGASTRODUODENOSCOPY: SHX5428

## 2017-02-20 HISTORY — PX: BIOPSY: SHX5522

## 2017-02-20 SURGERY — EGD (ESOPHAGOGASTRODUODENOSCOPY)
Anesthesia: Moderate Sedation

## 2017-02-20 MED ORDER — PROMETHAZINE HCL 25 MG/ML IJ SOLN
25.0000 mg | Freq: Once | INTRAMUSCULAR | Status: AC
  Administered 2017-02-20: 25 mg via INTRAVENOUS

## 2017-02-20 MED ORDER — SODIUM CHLORIDE 0.9 % IV SOLN
INTRAVENOUS | Status: DC
Start: 2017-02-20 — End: 2017-02-20
  Administered 2017-02-20: 13:00:00 via INTRAVENOUS

## 2017-02-20 MED ORDER — MEPERIDINE HCL 100 MG/ML IJ SOLN
INTRAMUSCULAR | Status: AC
  Filled 2017-02-20: qty 2

## 2017-02-20 MED ORDER — MEPERIDINE HCL 100 MG/ML IJ SOLN
INTRAMUSCULAR | Status: DC | PRN
  Administered 2017-02-20 (×2): 25 mg via INTRAVENOUS

## 2017-02-20 MED ORDER — MIDAZOLAM HCL 5 MG/5ML IJ SOLN
INTRAMUSCULAR | Status: AC
  Filled 2017-02-20: qty 10

## 2017-02-20 MED ORDER — LIDOCAINE VISCOUS 2 % MT SOLN
OROMUCOSAL | Status: AC
  Filled 2017-02-20: qty 15

## 2017-02-20 MED ORDER — STERILE WATER FOR IRRIGATION IR SOLN
Status: DC | PRN
  Administered 2017-02-20: 2.5 mL

## 2017-02-20 MED ORDER — SODIUM CHLORIDE 0.9% FLUSH
INTRAVENOUS | Status: AC
  Filled 2017-02-20: qty 10

## 2017-02-20 MED ORDER — LIDOCAINE VISCOUS 2 % MT SOLN
OROMUCOSAL | Status: DC | PRN
  Administered 2017-02-20: 1 via OROMUCOSAL

## 2017-02-20 MED ORDER — MIDAZOLAM HCL 5 MG/5ML IJ SOLN
INTRAMUSCULAR | Status: DC | PRN
Start: 2017-02-20 — End: 2017-02-20
  Administered 2017-02-20 (×2): 2 mg via INTRAVENOUS

## 2017-02-20 MED ORDER — PROMETHAZINE HCL 25 MG/ML IJ SOLN
INTRAMUSCULAR | Status: DC
Start: 2017-02-20 — End: 2017-02-20
  Filled 2017-02-20: qty 1

## 2017-02-20 NOTE — Discharge Instructions (Signed)
You have a small hiatal hernia, gastritis, and benign GASTRIC POLYPS, & DUODENITIS. I biopsied your ESOPHAGUS AND stomach.   DRINK WATER TO KEEP YOUR URINE LIGHT YELLOW.  FOLLOW A LOW FAT DIET. MEATS SHOULD BE BAKED, BROILED, OR BOILED. AVOID FRIED FOODS. SEE INFO BELOW.  CONTINUE AMITIZA. USE MILK OF MAGNESIA PILLS OR LIQUID THREE TIMES A DAY IF NEEDED TO REDUCE CONSTIPATION.  YOUR BIOPSY RESULTS WILL BE AVAILABLE IN MY CHART AFTER MAR 12 AND MY OFFICE WILL CONTACT YOU IN 10-14 DAYS WITH YOUR RESULTS. IF NO REASON FOR YOUR SYMPTOM RE IDENTIFIED ON BIOPSY, I WILL REFER YOUR TO [facility] FOR AN ADDITIONAL EVALUATION.  FOLLOW UP IN 4 MOS.    UPPER ENDOSCOPY AFTER CARE Read the instructions outlined below and refer to this sheet in the next week. These discharge instructions provide you with general information on caring for yourself after you leave the hospital. While your treatment has been planned according to the most current medical practices available, unavoidable complications occasionally occur. If you have any problems or questions after discharge, call DR. Anelle Parlow, [phone].  ACTIVITY  You may resume your regular activity, but move at a slower pace for the next 24 hours.   Take frequent rest periods for the next 24 hours.   Walking will help get rid of the air and reduce the bloated feeling in your belly (abdomen).   No driving for 24 hours (because of the medicine (anesthesia) used during the test).   You may shower.   Do not sign any important legal documents or operate any machinery for 24 hours (because of the anesthesia used during the test).    NUTRITION  Drink plenty of fluids.   You may resume your normal diet as instructed by your doctor.   Begin with a light meal and progress to your normal diet. Heavy or fried foods are harder to digest and may make you feel sick to your stomach (nauseated).   Avoid alcoholic beverages for 24 hours or as instructed.     MEDICATIONS  You may resume your normal medications.   WHAT YOU CAN EXPECT TODAY  Some feelings of bloating in the abdomen.   Passage of more gas than usual.    IF YOU HAD A BIOPSY TAKEN DURING THE UPPER ENDOSCOPY:  Eat a soft diet IF YOU HAVE NAUSEA, BLOATING, ABDOMINAL PAIN, OR VOMITING.    FINDING OUT THE RESULTS OF YOUR TEST Not all test results are available during your visit. DR. Oneida Alar WILL CALL YOU WITHIN 14 DAYS OF YOUR PROCEDUE WITH YOUR RESULTS. Do not assume everything is normal if you have not heard from DR. Elianie Hubers IN 14 DAYS, CALL HER OFFICE AT [phone].  SEEK IMMEDIATE MEDICAL ATTENTION AND CALL THE OFFICE: [phone] IF:  You have more than a spotting of blood in your stool.   Your belly is swollen (abdominal distention).   You are nauseated or vomiting.   You have a temperature over 101F.   You have abdominal pain or discomfort that is severe or gets worse throughout the day.   Gastritis  Gastritis is an inflammation (the body's way of reacting to injury and/or infection) of the stomach. It is often caused by bacterial (germ) infections. It can also be caused BY ASPIRIN, BC/GOODY POWDER'S, (IBUPROFEN) MOTRIN, OR ALEVE (NAPROXEN), chemicals (including alcohol), SPICY FOODS, and medications. This illness may be associated with generalized malaise (feeling tired, not well), UPPER ABDOMINAL STOMACH cramps, and fever. One common bacterial cause of gastritis  is an organism known as H. Pylori. This can be treated with antibiotics.     REFLUX   TREATMENT There are a number of medicines used to treat reflux including: Antacids.  ZANTAC Proton-pump inhibitors: Protonix  HOME CARE INSTRUCTIONS Eat 2-3 hours before going to bed.  Do not eat just a few very large meals. Instead, eat 4 TO 6 smaller meals throughout the day.  Try to identify foods and beverages that make your symptoms worse, and avoid these.  Avoid tight clothing.  Do not exercise  right after eating.

## 2017-02-20 NOTE — H&P (Addendum)
Primary Care Physician:  Pcp Not In System Primary Gastroenterologist:  Dr. Oneida Alar  Pre-Procedure History & Physical: HPI:  Ronnie Patrick is a 66 y.o. male here for -CHEST/EPIGASTRIC PAIN.  Past Medical History:  Diagnosis Date  . GERD (gastroesophageal reflux disease)   . HTN (hypertension)   . Hyperlipidemia   . Jock itch    LAMISIL/TERBINAFINE REQUIRED  . Lyme disease JUN 2016    Past Surgical History:  Procedure Laterality Date  . COLONOSCOPY  DEC 2015 [facility]   [facility]  . EXCISIONAL HEMORRHOIDECTOMY     AGE 5-PAINFUL  . FLEXIBLE SIGMOIDOSCOPY N/A 07/15/2015   Procedure: FLEXIBLE SIGMOIDOSCOPY;  Surgeon: Danie Binder, MD;  [facility];  Service: Endoscopy;  Laterality: N/A;  100 - moved to 12:00 - office to notify  . HEMORRHOID BANDING N/A 07/15/2015   Procedure: HEMORRHOID BANDING;  Surgeon: Danie Binder, MD;  [facility];  Service: Endoscopy;  Laterality: N/A;  . TONSILLECTOMY AND ADENOIDECTOMY     AS A CHILD  . UPPER GASTROINTESTINAL ENDOSCOPY  DEC 2015 [facility]   GERD, [place]    Prior to Admission medications   Medication Sig Start Date End Date Taking? Authorizing Provider  acetaminophen (TYLENOL) 500 MG tablet Take 500 mg by mouth 2 (two) times daily. May take an additional 500mg  two more times as needed for pain   Yes Historical Provider, MD  acyclovir (ZOVIRAX) 400 MG tablet Take 400 mg by mouth 2 (two) times daily.   Yes Historical Provider, MD  atorvastatin (LIPITOR) 40 MG tablet Take 40 mg by mouth at bedtime.  10/14/15  Yes Historical Provider, MD  calcium elemental as carbonate (TUMS ULTRA 1000) 400 MG tablet Chew 3,000 mg by mouth as needed for heartburn. Reported on 04/05/2016   Yes Historical Provider, MD  docusate sodium (COLACE) 100 MG capsule Take 100 mg by mouth at bedtime.   Yes Historical Provider, MD  emtricitabine-tenofovir (TRUVADA) 200-300 MG tablet Take 1 tablet by mouth daily with lunch.    Yes Historical Provider, MD   fluticasone (CUTIVATE) 0.05 % cream Apply 1 application topically daily. Reported on 04/05/2016 09/01/15  Yes Historical Provider, MD  fluticasone (FLONASE) 50 MCG/ACT nasal spray Place 1 spray into both nostrils daily.   Yes Historical Provider, MD  hydrochlorothiazide (HYDRODIURIL) 25 MG tablet Take 25 mg by mouth at bedtime.  01/16/17  Yes Historical Provider, MD  ketoconazole (NIZORAL) 2 % cream Apply 1 application topically 2 (two) times daily.   Yes Historical Provider, MD  lidocaine (XYLOCAINE) 2 % solution 2 TSP  PO 30 MINS QAC AND HS PRN FOR CHEST PAIN. MAY REPEAT DOSE EVERY 4 HOURS. NO MORE THAN 8 DOSES A DAY. Patient taking differently: 2 TSP  BY MOUTH EVERY 4 HOURS AS NEEDED FOR CHEST PAIN. MAY REPEAT DOSE EVERY 4 HOURS. NO MORE THAN 8 DOSES A DAY. 11/06/16  Yes Danie Binder, MD  loratadine (CLARITIN) 10 MG tablet Take 10 mg by mouth at bedtime.    Yes Historical Provider, MD  lubiprostone (AMITIZA) 24 MCG capsule Take 24 mcg by mouth 2 (two) times daily. As needed    Yes Historical Provider, MD  Multiple Vitamin (MULTIVITAMIN WITH MINERALS) TABS tablet Take 1 tablet by mouth daily at 12 noon.    Yes Historical Provider, MD  omeprazole (PRILOSEC) 40 MG capsule Take 40 mg by mouth at bedtime.   Yes Historical Provider, MD  Polyethyl Glycol-Propyl Glycol (SYSTANE OP) Apply 1-2 drops to eye daily  as needed (dry eyes).   Yes Historical Provider, MD  Potassium Chloride ER 20 MEQ TBCR Take 20 mEq by mouth daily at 12 noon.  01/25/17  Yes Historical Provider, MD  psyllium (METAMUCIL) 58.6 % powder Take 1 packet by mouth 3 (three) times daily.   Yes Historical Provider, MD  senna-docusate (SENOKOT-S) 8.6-50 MG per tablet Take 4 tablets by mouth at bedtime.   Yes Historical Provider, MD  sildenafil (REVATIO) 20 MG tablet Take 100 mg by mouth daily as needed (erectile dysfunction).   Yes Historical Provider, MD  triamcinolone (KENALOG) 0.025 % cream Apply 1 application topically 2 (two) times daily.    Yes Historical Provider, MD  sodium chloride (OCEAN) 0.65 % SOLN nasal spray Place 1 spray into both nostrils as needed for congestion.    Historical Provider, MD  terbinafine (LAMISIL) 1 % cream Apply 1 application topically 2 (two) times daily as needed.    Historical Provider, MD  VENTOLIN HFA 108 (90 BASE) MCG/ACT inhaler Inhale 2 puffs into the lungs every 4 (four) hours as needed for wheezing or shortness of breath.  04/28/15   Historical Provider, MD    Allergies as of 02/13/2017 - Review Complete 02/13/2017  Allergen Reaction Noted  . Flagyl [metronidazole] Itching 06/29/2015  . Penicillins  06/29/2015    Family History  Problem Relation Age of Onset  . Colon cancer Neg Hx   . Colon polyps Neg Hx     Social History   Social History  . Marital status: Divorced    Spouse name: N/A  . Number of children: N/A  . Years of education: N/A   Occupational History  . Not on file.   Social History Main Topics  . Smoking status: Former Smoker    Packs/day: 1.00    Years: 34.00    Quit date: 07/14/2001  . Smokeless tobacco: Never Used     Comment: Quit in 2002  . Alcohol use 0.0 oz/week     Comment: glass of wine months ago  . Drug use: No  . Sexual activity: Not on file   Other Topics Concern  . Not on file   Social History Narrative   ORIGINALLY FROM [place]. USED TO LOVE TO VACATION IN [place]: [place], [place].    JOB: RESEARCH TECHNICIAN, DEGREE IN COMPUTER SCIENCE, SOLD INSURANCE.      2 SONS: AGER 43, 37 AND THEY LIVE IN [place] WITH EX-WIFE.      RARE ETOH OR SWEETS. HAS A TWIN BROTHER WITH DIABETES.    Review of Systems: See HPI, otherwise negative ROS   Physical Exam: BP 131/87   Pulse 71   Temp 97.9 F (36.6 C) (Oral)   Resp (!) 8   Ht 5\' 6"  (1.676 m)   Wt 166 lb (75.3 kg)   SpO2 100%   BMI 26.79 kg/m  General:   Alert,  pleasant and cooperative in NAD Head:  Normocephalic and atraumatic. Neck:  Supple; Lungs:  Clear throughout to auscultation.     Heart:  Regular rate and rhythm. Abdomen:  Soft, nontender and nondistended. Normal bowel sounds, without guarding, and without rebound.   Neurologic:  Alert and  oriented x4;  grossly normal neurologically.  Impression/Plan:     DYSPEPSIA  PLAN:  EGD/POSSIBLE BALLOON DILATION TODAY. DISCUSSED PROCEDURE, BENEFITS, & RISKS: < 1% chance of medication reaction, bleeding, or perforation

## 2017-02-20 NOTE — Op Note (Signed)
[facility] Patient Name: Ronnie Patrick Procedure Date: 02/20/2017 1:10 PM MRN: [id number] Date of Birth: 12/27/1950 Attending MD: Barney Drain , MD CSN: [id number] Age: 66 Admit Type: Outpatient Procedure:                Upper GI endoscopy with cold forceps biopsy Indications:              Epigastric abdominal pain, Dyspepsia, Chest pain                            (non cardiac) Providers:                Barney Drain, MD, Lurline Del, RN, Tammy Vaught, RN Referring MD:             DR. Vernie Shanks , [place] Medicines:                Promethazine 25 mg IV, Meperidine 50 mg IV,                            Midazolam 4 mg IV Complications:            No immediate complications. Estimated Blood Loss:     Estimated blood loss was minimal. Procedure:                Pre-Anesthesia Assessment:                           - Prior to the procedure, a History and Physical                            was performed, and patient medications and                            allergies were reviewed. The patient's tolerance of                            previous anesthesia was also reviewed. The risks                            and benefits of the procedure and the sedation                            options and risks were discussed with the patient.                            All questions were answered, and informed consent                            was obtained. Prior Anticoagulants: The patient has                            taken no previous anticoagulant or antiplatelet                            agents. ASA Grade Assessment: II - A patient with  mild systemic disease. After reviewing the risks                            and benefits, the patient was deemed in                            satisfactory condition to undergo the procedure.                           After obtaining informed consent, the endoscope was                            passed under direct vision.  Throughout the                            procedure, the patient's blood pressure, pulse, and                            oxygen saturations were monitored continuously. The                            EG-[id number] ([id number]) scope was introduced through the                            mouth, and advanced to the second part of duodenum.                            The upper GI endoscopy was accomplished without                            difficulty. The patient tolerated the procedure                            well. Scope In: 1:42:09 PM Scope Out: 1:51:30 PM Total Procedure Duration: 0 hours 9 minutes 21 seconds  Findings:      The examined esophagus was normal. Biopsies were obtained from the       proximal(18 cm) and distal esophagus(30 cm) with cold forceps for       histology of suspected eosinophilic esophagitis.      A small hiatal hernia was present.      Patchy mild inflammation characterized by congestion (edema) and       erythema was found in the gastric antrum. Biopsies were taken with a       cold forceps for Helicobacter pylori testing.      The examined duodenum was normal. Impression:               - Small hiatal hernia.                           - Gastritis. Biopsied.                           - CHEST PAIN/EPIGASTRIC PAIN MOST LIKELY DUE TO  GASTRITIS/REFLUX. Moderate Sedation:      Moderate (conscious) sedation was administered by the endoscopy nurse       and supervised by the endoscopist. The following parameters were       monitored: oxygen saturation, heart rate, blood pressure, and response       to care. Total physician intraservice time was 18 minutes. Recommendation:           - High fiber diet.                           - Continue present medications.                           - Await pathology results. CONSIDRE REFRRAL TO [facility] FOR MANOMETRY/IMPEDANCE                           - Return to my office in 4  months.                           - Patient has a contact number available for                            emergencies. The signs and symptoms of potential                            delayed complications were discussed with the                            patient. Return to normal activities tomorrow.                            Written discharge instructions were provided to the                            patient. Procedure Code(s):        --- Professional ---                           [phone], Esophagogastroduodenoscopy, flexible,                            transoral; with biopsy, single or multiple                           99152, Moderate sedation services provided by the                            same physician or other qualified health care                            professional performing the diagnostic or  therapeutic service that the sedation supports,                            requiring the presence of an independent trained                            observer to assist in the monitoring of the                            patient's level of consciousness and physiological                            status; initial 15 minutes of intraservice time,                            patient age 32 years or older Diagnosis Code(s):        --- Professional ---                           K44.9, Diaphragmatic hernia without obstruction or                            gangrene                           K29.70, Gastritis, unspecified, without bleeding                           R10.13, Epigastric pain                           R07.89, Other chest pain CPT copyright 2016 American Medical Association. All rights reserved. The codes documented in this report are preliminary and upon coder review may  be revised to meet current compliance requirements. Barney Drain, MD Barney Drain, MD 02/20/2017 3:02:12 PM This report has been signed electronically. Number of Addenda: 0

## 2017-02-25 ENCOUNTER — Encounter (HOSPITAL_COMMUNITY): Payer: Self-pay | Admitting: Gastroenterology

## 2017-02-28 ENCOUNTER — Ambulatory Visit (INDEPENDENT_AMBULATORY_CARE_PROVIDER_SITE_OTHER): Payer: 59 | Admitting: Allergy & Immunology

## 2017-02-28 ENCOUNTER — Encounter: Payer: Self-pay | Admitting: Allergy & Immunology

## 2017-02-28 VITALS — BP 116/82 | HR 72 | Temp 98.7°F | Resp 20 | Ht 63.5 in | Wt 165.8 lb

## 2017-02-28 DIAGNOSIS — J3081 Allergic rhinitis due to animal (cat) (dog) hair and dander: Secondary | ICD-10-CM

## 2017-02-28 DIAGNOSIS — L299 Pruritus, unspecified: Secondary | ICD-10-CM

## 2017-02-28 DIAGNOSIS — K219 Gastro-esophageal reflux disease without esophagitis: Secondary | ICD-10-CM | POA: Diagnosis not present

## 2017-02-28 LAB — CBC WITH DIFFERENTIAL/PLATELET
BASOS PCT: 1 %
Basophils Absolute: 66 cells/uL (ref 0–200)
Eosinophils Absolute: 132 cells/uL (ref 15–500)
Eosinophils Relative: 2 %
HEMATOCRIT: 45.3 % (ref 38.5–50.0)
Hemoglobin: 15.2 g/dL (ref 13.2–17.1)
Lymphocytes Relative: 27 %
Lymphs Abs: 1782 cells/uL (ref 850–3900)
MCH: 29.1 pg (ref 27.0–33.0)
MCHC: 33.6 g/dL (ref 32.0–36.0)
MCV: 86.6 fL (ref 80.0–100.0)
MONO ABS: 528 {cells}/uL (ref 200–950)
MONOS PCT: 8 %
MPV: 9.3 fL (ref 7.5–12.5)
NEUTROS PCT: 62 %
Neutro Abs: 4092 cells/uL (ref 1500–7800)
PLATELETS: 221 10*3/uL (ref 140–400)
RBC: 5.23 MIL/uL (ref 4.20–5.80)
RDW: 14.9 % (ref 11.0–15.0)
WBC: 6.6 10*3/uL (ref 3.8–10.8)

## 2017-02-28 NOTE — Progress Notes (Signed)
NEW PATIENT  Date of Service/Encounter:  02/28/17  Referring provider: Lucy Antigua, DO   Assessment:   Itching  Gastroesophageal reflux disease  Chronic allergic rhinitis (grasses, ragweed, trees, mold, cat, cockroach, dust mite)  Plan/Recommendations:   1. Itching with chronic rhinitis - Testing today showed: positive to grasses, ragweed, trees, mold, cat, cockroach, and dust mite - Testing to the entire food panel was negative.  - Avoidance measures provided.  - Continue with your current extensive skin regimen.  - Stop Claritin and start cetirizine 10 mg or fexofenadine 180 mg 1-2 times daily. - Increase fluticasone to 2 sprays per nostril once daily. - We will get labs to make sure you have no serious causes of hives: CBC with differential, tryptase, and a chronic urticaria panel - We will also get a blood test to look for evidence of a cauliflower allergy since this is the only food that has been increased in the last few months.  - If medications are not working, we could consider allergy shots, although we would like to avoid this given your history of IT-induced anaphylaxis.  - I doubt this is the case, but this could possibly be a manifestation of contact dermatitis, which could be further evaluated with patch testing.   2. Gastroesophageal reflux disease - Continue with your reflux medications.  3. Return in about 3 months (around 05/31/2017).   Subjective:   Ronnie Patrick is a 66 y.o. male presenting today for evaluation of  Chief Complaint  Patient presents with  . Pruritis    back of scalp, under chin, ear lobes, top of feet, neck onset 3-4 months    Ronnie Patrick has a history of the following: Patient Active Problem List   Diagnosis Date Noted  . Dyspepsia   . GERD (gastroesophageal reflux disease) 11/07/2016  . Rectal discharge   . Soiling 06/29/2015    History obtained from: chart review and patient.  Ronnie Patrick was referred by Pcp Not In  System.     Ronnie Patrick is a 66 y.o. male presenting for an evaluation of pruritis. This has been ongoing for 3-4 months. He has not been able to pinpoint the trigger. He has looked at medications and foods and there has been nothing that he has come up with. He has increased his intake of cauliflower which is the only increased food he has been taking in for the last few months. He is itching over his entire body. He does have two cats but has had upwards of five cats at once.   Ronnie Patrick is using [employer] and has started using Cerve. He is using loratadine daily but stopped three days ago. He using All Free and Clear. He has used [employer]. He is using gloves with lotion every night. He is using a humidifier in his room to keep his room moist. He has been drinking more water and using Stevia flavoring. He has stopped using it but it did not fix the problem. He has since restarted it. He is followed by Dr. Jarvis Patrick at a Dermatology office in [place].   [name] does have a history of chronic rhinitis. He takes loratadine every day throughout the day. He also uses Flonase as needed. He has been allergy tested in the past and in fact has been on allergy shots. However, he experienced an anaphylaxis event in August 2001 which halted his allergy shots. He does have Ventolin which he uses around 3-4 times per year at the most. He  has never been hospitalized for his breathing and has never needed prednisone for his breathing. He does have a history of GERD. He did have an endoscopy that showed only duodenitis and a gastric polyp. Ronnie Patrick is his GI doctor.    Otherwise, there is no history of other atopic diseases, including drug allergies, stinging insect allergies, or urticaria. There is no significant infectious history. Vaccinations are up to date.    Past Medical History: Patient Active Problem List   Diagnosis Date Noted  . Dyspepsia   . GERD (gastroesophageal reflux disease) 11/07/2016  .  Rectal discharge   . Soiling 06/29/2015    Medication List:  Allergies as of 02/28/2017      Reactions   Bacitracin Hives   Flagyl [metronidazole] Itching   Neosporin [neomycin-bacitracin Zn-polymyx] Hives   Penicillins Itching   Has patient had a PCN reaction causing immediate rash, facial/tongue/throat swelling, SOB or lightheadedness with hypotension: No Has patient had a PCN reaction causing severe rash involving mucus membranes or skin necrosis: No Has patient had a PCN reaction that required hospitalization No Has patient had a PCN reaction occurring within the last 10 years: No If all of the above answers are "NO", then may proceed with Cephalosporin use.      Medication List       Accurate as of 02/28/17 11:28 PM. Always use your most recent med list.          acyclovir 400 MG tablet Commonly known as:  ZOVIRAX Take 400 mg by mouth 2 (two) times daily.   amLODipine 10 MG tablet Commonly known as:  NORVASC TK 1 T PO  QD   docusate sodium 100 MG capsule Commonly known as:  COLACE Take 100 mg by mouth at bedtime.   emtricitabine-tenofovir 200-300 MG tablet Commonly known as:  TRUVADA Take 1 tablet by mouth daily with lunch.   fluticasone 0.05 % cream Commonly known as:  CUTIVATE APPLY TO AFFECTED AREA BID   fluticasone 50 MCG/ACT nasal spray Commonly known as:  FLONASE Place 1 spray into both nostrils daily.   hydrochlorothiazide 25 MG tablet Commonly known as:  HYDRODIURIL Take 25 mg by mouth at bedtime.   ketoconazole 2 % cream Commonly known as:  NIZORAL Apply 1 application topically 2 (two) times daily.   lamoTRIgine 25 MG tablet Commonly known as:  LAMICTAL TK 3 TS PO QD   lidocaine 2 % solution Commonly known as:  XYLOCAINE 2 TSP  PO 30 MINS QAC AND HS PRN FOR CHEST PAIN. MAY REPEAT DOSE EVERY 4 HOURS. NO MORE THAN 8 DOSES A DAY.   loratadine 10 MG tablet Commonly known as:  CLARITIN Take 10 mg by mouth at bedtime.   lubiprostone 24 MCG  capsule Commonly known as:  AMITIZA Take 24 mcg by mouth 2 (two) times daily. As needed   multivitamin with minerals Tabs tablet Take 1 tablet by mouth daily at 12 noon.   omeprazole 40 MG capsule Commonly known as:  PRILOSEC Take 40 mg by mouth at bedtime.   Potassium Chloride ER 20 MEQ Tbcr Take 20 mEq by mouth daily at 12 noon.   psyllium 58.6 % powder Commonly known as:  METAMUCIL Take 1 packet by mouth 3 (three) times daily.   senna-docusate 8.6-50 MG tablet Commonly known as:  Senokot-S Take 4 tablets by mouth at bedtime.   sildenafil 20 MG tablet Commonly known as:  REVATIO Take 100 mg by mouth daily as needed (erectile dysfunction).  sodium chloride 0.65 % Soln nasal spray Commonly known as:  OCEAN Place 1 spray into both nostrils as needed for congestion.   SYSTANE OP Apply 1-2 drops to eye daily as needed (dry eyes).   terbinafine 1 % cream Commonly known as:  LAMISIL Apply 1 application topically 2 (two) times daily as needed.   triamcinolone 0.025 % cream Commonly known as:  KENALOG Apply 1 application topically 2 (two) times daily.   TUMS ULTRA 1000 400 MG chewable tablet Generic drug:  calcium elemental as carbonate Chew 3,000 mg by mouth as needed for heartburn. Reported on 04/05/2016   VENTOLIN HFA 108 (90 Base) MCG/ACT inhaler Generic drug:  albuterol Inhale 2 puffs into the lungs every 4 (four) hours as needed for wheezing or shortness of breath.       Birth History: non-contributory.   Developmental History: non-contributory.   Past Surgical History: Past Surgical History:  Procedure Laterality Date  . BIOPSY  02/20/2017   Procedure: BIOPSY;  Surgeon: Danie Binder, MD;  Location: [facility];  Service: Endoscopy;;  gastric and esophageal  . COLONOSCOPY  DEC 2015 [facility]   [facility]  . ESOPHAGOGASTRODUODENOSCOPY N/A 02/20/2017   Procedure: ESOPHAGOGASTRODUODENOSCOPY (EGD);  Surgeon: Danie Binder, MD;  Location: [facility];  Service:  Endoscopy;  Laterality: N/A;  215  . EXCISIONAL HEMORRHOIDECTOMY     AGE 53-PAINFUL  . FLEXIBLE SIGMOIDOSCOPY N/A 07/15/2015   Procedure: FLEXIBLE SIGMOIDOSCOPY;  Surgeon: Danie Binder, MD;  Location: [facility];  Service: Endoscopy;  Laterality: N/A;  100 - moved to 12:00 - office to notify  . HEMORRHOID BANDING N/A 07/15/2015   Procedure: HEMORRHOID BANDING;  Surgeon: Danie Binder, MD;  Location: [facility];  Service: Endoscopy;  Laterality: N/A;  . TONSILLECTOMY AND ADENOIDECTOMY     AS A CHILD  . UPPER GASTROINTESTINAL ENDOSCOPY  DEC 2015 [facility]   GERD, HH     Family History: Family History  Problem Relation Age of Onset  . Allergic rhinitis Mother   . Eczema Mother   . Urticaria Mother   . Asthma Father   . Colon cancer Neg Hx   . Colon polyps Neg Hx      Social History: Franko lives at home with his partner in [place], [place]. They live in a house that is nearly 67 years old. There is carpeting throughout the home as well as two cats. He has electric heating with a heat pump as well as central AC. There are no dust mite covers. There is no tobacco exposure. He has had several jobs in the past, including with [employer], [employer], and as an independent Medical illustrator. He did smoke from 1967 through 2002 (34 years total - around one pack per day).    Review of Systems: a 14-point review of systems is pertinent for what is mentioned in HPI.  Otherwise, all other systems were negative. Constitutional: negative other than that listed in the HPI Eyes: negative other than that listed in the HPI Ears, nose, mouth, throat, and face: negative other than that listed in the HPI Respiratory: negative other than that listed in the HPI Cardiovascular: negative other than that listed in the HPI Gastrointestinal: negative other than that listed in the HPI Genitourinary: negative other than that listed in the HPI Integument: negative other than that listed in the  HPI Hematologic: negative other than that listed in the HPI Musculoskeletal: negative other than that listed in the HPI Neurological: negative  other than that listed in the HPI Allergy/Immunologic: negative other than that listed in the HPI    Objective:   Blood pressure 116/82, pulse 72, temperature 98.7 F (37.1 C), temperature source Oral, resp. rate 20, height 5' 3.5" (1.613 m), weight 165 lb 12.8 oz (75.2 kg). Body mass index is 28.91 kg/m.   Physical Exam:  General: Alert, interactive, in no acute distress. Pleasant, personable, and talkative.  Eyes: No conjunctival injection present on the right, No conjunctival injection present on the left, PERRL bilaterally, No discharge on the right, No discharge on the left, No Horner-Trantas dots present and allergic shiners present bilaterally Ears: Right TM pearly gray with normal light reflex, Left TM pearly gray with normal light reflex, Right TM intact without perforation and Left TM intact without perforation.  Nose/Throat: External nose within normal limits, nasal crease present and septum midline, turbinates markedly edematous and pale with clear discharge, post-pharynx erythematous with cobblestoning in the posterior oropharynx. Tonsils 3+ without exudates Neck: Supple without thyromegaly.  Adenopathy: no enlarged lymph nodes appreciated in the anterior cervical, occipital, axillary, epitrochlear, inguinal, or popliteal regions Lungs: Clear to auscultation without wheezing, rhonchi or rales. No increased work of breathing. CV: Normal S1/S2, no murmurs. Capillary refill <2 seconds.  Abdomen: Nondistended, nontender. No guarding or rebound tenderness. Bowel sounds present in all fields and hyperactive  Skin: Dry, erythematous, excoriated patches on the posterior neck as well as some redness on the occipital portion of the scalp. Extremities:  No clubbing, cyanosis or edema. Neuro:   Grossly intact. No focal deficits appreciated.  Responsive to questions.  Diagnostic studies:    Allergy Studies:   Indoor/Outdoor Percutaneous Adult Environmental Panel: negative to the entire panel with adequate controls.  Indoor/Outdoor Selected Intradermal Environmental Panel: positive to [place] grass, Johnson grass, ragweed mix, tree mix, mold mix #1, mold mix #2, cat, cockroach and mite mix. Otherwise negative with adequate controls.  Entire Foods Panel: negative to the 66 foods tested with adequate controls      Salvatore Marvel, MD [facility]

## 2017-02-28 NOTE — Patient Instructions (Addendum)
1. Itching + chronic rhinitis - Testing today showed: Positive to grasses, ragweed, trees, mold, cat, cockroach, and dust mite - Avoidance measures provided.  - Continue with your current extensive skin regimen.  - Stop Claritin and start cetirizine 10 mg or fexofenadine 180 mg 1-2 times daily. - Increase fluticasone to 2 sprays per nostril once daily. - We will get labs to make sure you have no serious causes of hives: CBC with differential, tryptase, and a chronic urticaria panel - If medications are not working, we could consider allergy shots, although we would like to avoid this given your history of the reaction.  2. Gastroesophageal reflux disease - Continue with your reflux medications.  3. Return in about 3 months (around 05/31/2017).  Please inform us of any Emergency Department visits, hospitalizations, or changes in symptoms. Call us before going to the ED for breathing or allergy symptoms since we might be able to fit you in for a sick visit. Feel free to contact us anytime with any questions, problems, or concerns.  It was a pleasure to meet you today! Happy spring!   Websites that have reliable patient information: 1. American Academy of Asthma, Allergy, and Immunology: www.aaaai.org 2. Food Allergy Research and Education (FARE): foodallergy.org 3. Mothers of Asthmatics: http://www.asthmacommunitynetwork.org 4. American College of Allergy, Asthma, and Immunology: www.acaai.org

## 2017-03-01 LAB — TRYPTASE: Tryptase: 3 ug/L (ref <11)

## 2017-03-01 LAB — ALLERGEN, CAULIFLOWER, RF291

## 2017-03-01 NOTE — Progress Notes (Signed)
Hey Dr. Ernst Bowler, [place] Falling does not have a limit, so you're good. Juliann Pulse

## 2017-03-05 ENCOUNTER — Telehealth: Payer: Self-pay | Admitting: Gastroenterology

## 2017-03-05 ENCOUNTER — Other Ambulatory Visit: Payer: Self-pay

## 2017-03-05 DIAGNOSIS — R1013 Epigastric pain: Secondary | ICD-10-CM

## 2017-03-05 DIAGNOSIS — R079 Chest pain, unspecified: Secondary | ICD-10-CM

## 2017-03-05 NOTE — Progress Notes (Signed)
appt made

## 2017-03-05 NOTE — Progress Notes (Signed)
Pt is aware. He would like for [place] to cancel the cardiology referral we made. He had to have PCP do his referral and they have referred him to cardiology. He is aware to get the chest x ray.  Pt also said that Dr. Oneida Alar had mentioned referring him to [facility] and he would like that referral.

## 2017-03-05 NOTE — Telephone Encounter (Signed)
Per procedure discharge instruction by [facility] patient to be referred to [facility]

## 2017-03-05 NOTE — Telephone Encounter (Signed)
Referral info faxed to [facility]. See result note.

## 2017-03-06 ENCOUNTER — Telehealth: Payer: Self-pay

## 2017-03-06 ENCOUNTER — Ambulatory Visit: Payer: Medicare HMO | Admitting: Gastroenterology

## 2017-03-06 LAB — CP CHRONIC URTICARIA INDEX PANEL
Histamine Release: <16 % (ref <16)
TSH: 2.09 mIU/L (ref 0.40–4.50)
Thyroglobulin Ab: <1 IU/mL (ref <2)

## 2017-03-06 NOTE — Telephone Encounter (Signed)
Jennie from PCP called wanting to know if they needed to do a PA for the referral to [facility]. I told them that we had told care of it and that [facility] would take care for the PA for the test. I explained everything.

## 2017-03-06 NOTE — Telephone Encounter (Signed)
PT called and said he is checking with his [employer] to see if OK for Dr. Oneida Alar to order the chest x ray, or if his PCP should order it. He will let me know what he finds out.

## 2017-03-06 NOTE — Telephone Encounter (Signed)
Pt is trying to clarify how he is to take the Milk of magnesia. He said Dr. Oneida Alar told him to take it 3 times a day, but did not say how much at the time. He said he looked up the dosage, and it said NOT OVER 4 TBSP in a day. He has been taking 2 TBSP at noon and at bedtime. Would like to know does he need a third dose daily?

## 2017-03-07 ENCOUNTER — Encounter: Payer: Self-pay | Admitting: Allergy & Immunology

## 2017-03-11 ENCOUNTER — Telehealth: Payer: Self-pay | Admitting: Gastroenterology

## 2017-03-11 NOTE — Telephone Encounter (Signed)
Pt called to say he is scheduled for his manometry for 04/09/2017 at 2 pm

## 2017-03-11 NOTE — Telephone Encounter (Signed)
Called [facility] to f/u on referral for procedure. Pt can call [facility] to schedule. Called and informed pt. He will call [facility] to schedule procedure and let our office know.  Pt called our office. He is scheduled at [facility] 04/09/17 at 2:00pm.

## 2017-03-11 NOTE — Telephone Encounter (Signed)
Noted  

## 2017-03-11 NOTE — Telephone Encounter (Signed)
PLEASE CALL PT. HE CAN TAKE 30 ML UP TO 3 TIMES A DAY. THE BOTTLE HAS A DOSE RESTRICTION FOR FOLKS NOT UNDER THE CARE OF A PHYSICIAN.

## 2017-03-12 NOTE — Telephone Encounter (Signed)
Pt is aware.  

## 2017-03-14 ENCOUNTER — Encounter: Payer: Self-pay | Admitting: Allergy & Immunology

## 2017-03-26 ENCOUNTER — Encounter: Payer: Self-pay | Admitting: Allergy & Immunology

## 2017-03-27 ENCOUNTER — Telehealth: Payer: Self-pay

## 2017-03-27 NOTE — Telephone Encounter (Signed)
Left message for patient to call back and schedule the patch testing.

## 2017-03-27 NOTE — Telephone Encounter (Signed)
Patient will be coming next month for patch testing in [facility]

## 2017-04-22 ENCOUNTER — Ambulatory Visit (INDEPENDENT_AMBULATORY_CARE_PROVIDER_SITE_OTHER): Payer: Medicare HMO | Admitting: Allergy & Immunology

## 2017-04-22 ENCOUNTER — Encounter: Payer: Self-pay | Admitting: Allergy & Immunology

## 2017-04-22 DIAGNOSIS — L239 Allergic contact dermatitis, unspecified cause: Secondary | ICD-10-CM

## 2017-04-22 DIAGNOSIS — H6593 Unspecified nonsuppurative otitis media, bilateral: Secondary | ICD-10-CM | POA: Diagnosis not present

## 2017-04-22 DIAGNOSIS — J3081 Allergic rhinitis due to animal (cat) (dog) hair and dander: Secondary | ICD-10-CM

## 2017-04-22 NOTE — Progress Notes (Signed)
FOLLOW UP  Date of Service/Encounter:  04/22/17   Assessment:   Allergic contact dermatitis  Itching  Chronic allergic rhinitis (grasses, ragweed, trees, mold, cat, cockroach, dust mite)   Plan/Recommendations:   1. Allergic contact dermatitis - True Test patches placed along with dish soap, deodorant, and the lotion. - The dishwashing detergent had multiple individual chemicals in it, which did take some time to find. - Fortunately, some of the individual agents within the dishing detergent are included within the True Test.  - We deferred patch testing the cleaning agents given the risk of chemical burns.  - [name] uses gloves when changing the kitty litter and this would be difficult to put into the well, therefore we deferred attempting patch testing on this.  - Deferred testing of the lubricants since the rash occurs over the entire body while the lubricant is used only on a particular part of the body.  - No showers until after the patches are removed on Wednesday. - Come back on Wednesday and Friday for patch reads. - You will be seeing one of my partners - Dr. Nelva Bush - on those days. - I will look in  to see whether we have some Xyzal samples. - If we do have samples, I will bring them back for your visit on Wednesday.   2. Chronic allergic rhinitis (grasses, ragweed, trees, mold, cat, cockroach, dust mite) - Sample of Dymista provided: two sprays per nostril 1 time daily. - We briefly discussed allergen immunotherapy, however at this time the itching is the most concerning symptom for [name].  3. Bilateral OME with a history of ear fullness - We will refer to Dr. Benjamine Mola for evaluation and treatment. - I recommended that he continue with the nasal sprays as a means of controlling enlarged turbinates and encouraging adequate functioning of the Eustachian tubes  4. Return in about 2 days (04/24/17) and 4 days (04/26/17) at 10:15 on each day for patch  readings.   Subjective:   Ronnie Patrick is a 66 y.o. male presenting today for follow up of  Chief Complaint  Patient presents with  . Patch Testing    Ronnie Patrick has a history of the following: Patient Active Problem List   Diagnosis Date Noted  . Dyspepsia   . GERD (gastroesophageal reflux disease) 11/07/2016  . Rectal discharge   . Soiling 06/29/2015    History obtained from: chart review and patient.  Ronnie Patrick was referred by Ronnie Patrick     Ronnie Patrick is a 66 y.o. male presenting for a follow up visit. The patient was last seen on his initial evaluation in March 2018. At that time, his chief complaint was itching. He also had some allergic rhinitis symptoms. We did do testing that showed positives to grasses, ragweed, trees, mold, cat, cockroach, and dust mite. He was also concerned with foods causing some of his reactions, therefore we did the entire food panel which was completely negative. We got some labs to rule out serious causes of hives and itching including a CBC, tryptase, and a chronic urticaria panel all of which were normal. He was also concerned with cauliflower contributing to his itching, but this was negative as well. We felt that his itching might be related to a contact dermatitis, therefore he presents today for patch placement.  Since last visit, he continues to have itching on his scalp, under his chin, around his face, as well as his feet. He is currently taking  two Allegra daily, which does take the edge off somewhat. He gets these over the counter and pays around $17 per month for the two tablet dosing. Zyrtec makes him very sleepy and decreases his sexual performance. He has never tried Xyzal but is interested in giving it a shot. His itching is not completely resolved. His PCP said that he might have fungal infection so she started a Lamisil antifungal ointment, which at this time has not provided much relief. He was previously followed by Dr.  Rozann Lesches (who works with Dr. [place] Crane in [place]). According to Ronnie Patrick, she has not seemed interested in him as a patient. He is going to see a dermatologist at [facility], referral pending. He is interested in getting a skin biopsy to see what might be going on at a deeper level.   Ronnie Patrick has brought an entire box of other household chemicals with him today, including cleaning agents, dishwashing soap, and deodorant as well as lubricants. He also brought in a sample of the kitty litter that he uses with his cats. He called the company and apparently it is manufactured in [place] from bits of clay as well as baking soda. He is unsure which chemical might be triggering his rash but is interested in getting these tested. He does use gloves when cleaning the kitty litter but only occasionally uses gloves with using the cleaning products. He brought in lubrications that he uses, however he never has a reaction to any of them.                     Ronnie Patrick had an appointment with [facility] on April 24 for esophageal manometry. This totally normal. Two days after this, he went to see his therapist and his PrEP visit with Infectious Disease. He feels that his GI issues are psychosomatic. He is having some issues with a neighbor in his apartment as well, who recently moved in and drive his moped close to the building, a sound which reverberates off of the hallway and provides noxious fumes for his neighbors. This has actually led many of his GI issues to turn to diarrhea.  [employer] Deodorant:   Suave Lotion (used at least nightly):   Dishwashing Soap:   I was finally able to find the ingredients of Sun dishwashing detergent: fragrance, METHYLISOTHIAZOLINONE, METHYLCHLOROISOTHIAZOLINONE, FD&C YELLOW 5, SODIUM DODECYLBENZENESULFONATE,  SODIUM LAURETH SULFATE, SODIUM LAURYL SULFATE, TETRASODIUM EDTA, BENZISOTHIAZOLINONE, SODIUM BENZOTRIAZOLYL BUTYLPHENOL SULFONATE, BUTETH-3, TRIBUTYL  CITRATE, FD&C RED 4, TRIETHYLENE GLYCOL, MONOBUTYL ETHER, LAURAMINE OXIDE, WATER, MAGNESIUM SULFATE, SODIUM HYDROXIDE   Otherwise, there have been no changes to his past medical history, surgical history, family history, or social history.    Review of Systems: a 14-point review of systems is pertinent for what is mentioned in HPI.  Otherwise, all other systems were negative. Constitutional: negative other than that listed in the HPI Eyes: negative other than that listed in the HPI Ears, nose, mouth, throat, and face: negative other than that listed in the HPI Respiratory: negative other than that listed in the HPI Cardiovascular: negative other than that listed in the HPI Gastrointestinal: negative other than that listed in the HPI Genitourinary: negative other than that listed in the HPI Integument: negative other than that listed in the HPI Hematologic: negative other than that listed in the HPI Musculoskeletal: negative other than that listed in the HPI Neurological: negative other than that listed in the HPI Allergy/Immunologic: negative other than that listed in the  HPI    Objective:   There were no vitals taken for this visit. There is no height or weight on file to calculate BMI.   Physical Exam:  General: Alert, interactive, in no acute distress. Somewhat anxious. Very talkative.  Eyes: No conjunctival injection present on the right, No conjunctival injection present on the left, PERRL bilaterally, No discharge on the right, No discharge on the left and No Horner-Trantas dots present Ears: Right OME, Left OME, Right TM intact without perforation and Left TM intact without perforation.  Nose/Throat: External nose within normal limits, nasal crease present and septum midline, turbinates edematous with clear discharge, post-pharynx erythematous without cobblestoning in the posterior oropharynx. Tonsils 2+ without exudates Neck: Supple without thyromegaly. Lungs: Clear to  auscultation without wheezing, rhonchi or rales. No increased work of breathing. CV: Normal S1/S2, no murmurs. Capillary refill <2 seconds.  Skin: Dry, erythematous, excoriated patches on the posterior neck as well as some redness on the occipital portion of the scalp. Neuro:   Grossly intact. No focal deficits appreciated. Responsive to questions.   Diagnostic studies: none  True Test patches were placed (36 total). We also prepared blank wells containing a sample of his lotion, a sample of the dishwashing detergent, as well as scrapings from his deodorant.       Salvatore Marvel, MD [facility]

## 2017-04-22 NOTE — Addendum Note (Signed)
Addended by: Valentina Shaggy on: 04/22/2017 09:40 PM   Modules accepted: Orders

## 2017-04-22 NOTE — Patient Instructions (Addendum)
1. Allergic contact dermatitis - True Test patches placed along with dish soap, deodorant, and the lotion.  - No showers until after the patches are removed on Wednesday. - Come back on Wednesday and Friday for patch reads. - You will be seeing one of my partners - Dr. Nelva Bush - on those days. - I will look in [place] to see whether we have some Xyzal samples. - If we do have samples, I will bring them back for your visit on Wednesday.  - Sample of Dymista provided: two sprays per nostril 1 time daily.  2. Return in about 2 days (04/24/17) and 4 days (04/26/17) at 10:15 on each day for patch readings.  Please inform us of any Emergency Department visits, hospitalizations, or changes in symptoms. Call us before going to the ED for breathing or allergy symptoms since we might be able to fit you in for a sick visit. Feel free to contact us anytime with any questions, problems, or concerns.  It was a pleasure to see you again today! Happy spring!   Websites that have reliable patient information: 1. American Academy of Asthma, Allergy, and Immunology: www.aaaai.org 2. Food Allergy Research and Education (FARE): foodallergy.org 3. Mothers of Asthmatics: http://www.asthmacommunitynetwork.org 4. American College of Allergy, Asthma, and Immunology: www.acaai.org

## 2017-04-23 ENCOUNTER — Encounter: Payer: Self-pay | Admitting: Allergy & Immunology

## 2017-04-24 ENCOUNTER — Telehealth: Payer: Self-pay

## 2017-04-24 ENCOUNTER — Encounter: Payer: Self-pay | Admitting: Allergy and Immunology

## 2017-04-24 ENCOUNTER — Ambulatory Visit: Payer: Medicare HMO | Admitting: Allergy and Immunology

## 2017-04-24 DIAGNOSIS — L239 Allergic contact dermatitis, unspecified cause: Secondary | ICD-10-CM

## 2017-04-24 NOTE — Telephone Encounter (Signed)
-----   Message from Valentina Shaggy, MD sent at 04/22/2017  9:40 PM EDT ----- Can you send Jeneen Rinks to see Dr. Benjamine Mola please? Thank you!

## 2017-04-24 NOTE — Telephone Encounter (Signed)
Spoke with patients PCP, they will have to give an authorization for the patient to go to an ENT due to his insurance requiring a authorization. They will be calling the patient to see where he wants to go due to him requesting to go to a different ENT at his last visit with the PCP.  Thanks

## 2017-04-24 NOTE — Progress Notes (Signed)
Ronnie Patrick returns to this clinic for his initial read on his patch test. He had erythema and induration involving the entire square area of dishwashing detergent application. There was no vesicule formation. He will follow-up later this week for his second read with Dr. Ernst Bowler.

## 2017-04-24 NOTE — Progress Notes (Signed)
Spoke with patients PCP, they will have to give an authorization for the patient to go to an ENT due to his insurance requiring a authorization. They will be calling the patient to see where he wants to go due to him requesting to go to a different ENT at his last visit with the PCP.  Thanks

## 2017-04-26 ENCOUNTER — Ambulatory Visit: Payer: Medicare HMO | Admitting: Allergy

## 2017-04-26 ENCOUNTER — Telehealth: Payer: Self-pay | Admitting: Allergy & Immunology

## 2017-04-26 DIAGNOSIS — L235 Allergic contact dermatitis due to other chemical products: Secondary | ICD-10-CM

## 2017-04-26 MED ORDER — AZELASTINE-FLUTICASONE 137-50 MCG/ACT NA SUSP: 2.0000 | Freq: Two times a day (BID) | NASAL | 5 refills | Status: DC

## 2017-04-26 NOTE — Telephone Encounter (Signed)
I received the following email from Mr. Ries. I will forward to the nursing pool to take care of this.   *Caution - External Email*  Dr. Ernst Bowler,  Thanks for [facility]. It was NOT approved since it requires preauthorization or precertification which I did not know. In order to have it approved, please be so kind as to call [employer]'s preauth/precert line at [phone] then press Option 2. You will need some or all of the following information about me: Ronnie Patrick, Member IDGeorga Kaufmann, [id number], Phone: [phone], Address: [address] [place], [place] [address], SSN: [id number]. Please let me know when you've had a chance to handle this. I really appreciate it.  Have a great weekend!  Much obliged,  - Ronnie Patrick [phone] text or call  Sent from my iPhone

## 2017-04-26 NOTE — Progress Notes (Signed)
    Follow-up Note  RE: Ronnie Patrick MRN: [id number] DOB: November 05, 1951 Date of Office Visit: 04/26/2017  Primary care provider: Carter Kitten, DO Referring provider: Christoper Fabian returns to the office today for the 2nd patch test interpretation, given suspected history of contact dermatitis.    Diagnostics:  96 hour reading: erythema and induration involving the entire square area of dishwashing detergent application  Plan:  Allergic contact dermatitis Recommend he avoid contact with the dishwashing detergent.   Advised he could apply hydrocortisone cream which he has at home to the affected area He will be contacting Dr. Ernst Bowler regarding further testing.     Prudy Feeler, MD [facility]

## 2017-04-29 NOTE — Telephone Encounter (Signed)
Prior Auth has been submitted via covermymeds

## 2017-04-29 NOTE — Telephone Encounter (Signed)
Approved. Patient informed

## 2017-05-09 ENCOUNTER — Ambulatory Visit (INDEPENDENT_AMBULATORY_CARE_PROVIDER_SITE_OTHER): Payer: Medicare HMO | Admitting: Nurse Practitioner

## 2017-05-09 ENCOUNTER — Encounter: Payer: Self-pay | Admitting: Nurse Practitioner

## 2017-05-09 DIAGNOSIS — K5900 Constipation, unspecified: Secondary | ICD-10-CM

## 2017-05-09 DIAGNOSIS — R198 Other specified symptoms and signs involving the digestive system and abdomen: Secondary | ICD-10-CM | POA: Insufficient documentation

## 2017-05-09 MED ORDER — PLECANATIDE 3 MG PO TABS: 1.0000 | ORAL_TABLET | Freq: Every day | ORAL | 0 refills | Status: DC

## 2017-05-09 NOTE — Assessment & Plan Note (Signed)
Noted significant constipation over the past month. He is tried multiple over-the-counter remedies as well as increased fiber and water intake. At this point I will have him hold Amitiza and start Trulance. I will provide samples to last 2 weeks and request a progress report in 1-2 weeks. His last colonoscopy was just 3 years ago with a flexible sigmoidoscopy 2 years ago. No significant findings per the patient. I will request his last colonoscopy report from [place], [place]. If he continues with constipation despite multiple medications, given acute changes in bowel habits, we may need to proceed with colonoscopy. Return for follow-up in 6 weeks.

## 2017-05-09 NOTE — Progress Notes (Signed)
Referring Provider: Carter Kitten, DO Primary Care Physician:  Carter Kitten, DO Primary GI:  Dr. Oneida Alar  Chief Complaint  Patient presents with  . Constipation    HPI:   Ronnie Patrick is a 66 y.o. male who presents for constipation. The patient was last seen in our office 02/13/2017 for GERD. At that time noted constipation with Lamictal and Norvasc. Noted to used to wake up in the morning and have a productive bowel movement now with hard stools in the morning which get softer during the day. Decreased appetite and 8 pound weight loss in the previous 3 months. He was on Amitiza once a day at that time. Recommended increased Amitiza to twice a day. Incidentally he was having GERD with dysphagia and underwent upper endoscopy which found likely gastritis but he was referred to [facility] for impedance study.  Last lower: Evaluation was a flexible sigmoidoscopy on 07/15/2015 for mucus after a bowel movement which found normal sigmoid colon, mucus discharge due to waking by large external hemorrhoids. Recommended hemorrhoidectomy. Surgical pathology of biopsies found 2 polyps to be hyperplastic.  Today he states he's doing ok overall. Having worsening constipation, began worsening after April 24. Has tried MoM tid. Previously went daily with no problem "in the morning like clockwork" and nice/soft with sensation of complete emptying. Now he is having stools essentially daily but going very little, lots of straining. Has associated lower abdominal pain described as dull and pain will improve if he has a moderate-sized stool. Is on Amitiza twice daily, has also taken MiraLAX (no effect), fiber supplement tid, increased water, senna, docusate, increased fiber foods. Had to try soap suds enema x 2 a few days ago with some minor stool production. Last tried Linzess a few years ago. Last colonoscopy December 2015 at [facility] with no polyps, recommend 10 year repeat exam. Mild occasional nausea, no  vomiting. No fever, chills. Denies chest pain, dyspnea, dizziness, lightheadedness, syncope, near syncope. Denies any other upper or lower GI symptoms.   Past Medical History:  Diagnosis Date  . GERD (gastroesophageal reflux disease)   . HTN (hypertension)   . Hyperlipidemia   . Jock itch    LAMISIL/TERBINAFINE REQUIRED  . Lyme disease JUN 2016    Past Surgical History:  Procedure Laterality Date  . BIOPSY  02/20/2017   Procedure: BIOPSY;  Surgeon: Danie Binder, MD;  [facility];  Service: Endoscopy;;  gastric and esophageal  . COLONOSCOPY  DEC 2015 [facility]  . ESOPHAGOGASTRODUODENOSCOPY N/A 02/20/2017   Procedure: ESOPHAGOGASTRODUODENOSCOPY (EGD);  Surgeon: Danie Binder, MD;  Location: [facility];  Service: Endoscopy;  Laterality: N/A;  215  . EXCISIONAL HEMORRHOIDECTOMY     AGE 89-PAINF[age]  . FLEXIBLE SIGMOIDOSCOPY N/A 07/15/2015   Procedure: FLEXIBLE SIGMOIDOSCOPY;  Surgeon: Danie Binder, MD;  Location: [facility];  Service: Endoscopy;  Laterality: N/A;  100 - moved to 12:00 - office to notify  . HEMORRHOID BANDING N/A 07/15/2015   Procedure: HEMORRHOID BANDING;  Surgeon: Danie Binder, MD;  [facility];  Service: Endoscopy;  Laterality: N/A;  . TONSILLECTOMY AND ADENOIDECTOMY     AS A CHILD  . UPPER GASTROINTESTINAL ENDOSCOPY  DEC 2015 [facility]   GERD, HH    Current Outpatient Prescriptions  Medication Sig Dispense Refill  . acetaminophen (TYLENOL) 500 MG tablet Take 500 mg by mouth 2 (two) times daily.    Marland Kitchen acyclovir (ZOVIRAX) 400 MG tablet Take  400 mg by mouth 2 (two) times daily.  2  . atorvastatin (LIPITOR) 40 MG tablet Take 40 mg by mouth daily.  4  . Azelastine-Fluticasone 137-50 MCG/ACT SUSP Place 2 sprays into the nose 2 (two) times daily. 23 g 5  . calcium elemental as carbonate (TUMS ULTRA 1000) 400 MG tablet Chew 3,000 mg by mouth as needed for heartburn. Reported on 04/05/2016    . docusate sodium (COLACE) 100 MG capsule Take 100 mg  by mouth at bedtime.    Marland Kitchen emtricitabine-tenofovir (TRUVADA) 200-300 MG tablet Take 1 tablet by mouth daily with lunch.     . fexofenadine (ALLEGRA) 180 MG tablet Take 180 mg by mouth 2 (two) times daily.     . fluticasone (CUTIVATE) 0.05 % cream APPLY TO AFFECTED AREA BID  3  . hydrochlorothiazide (HYDRODIURIL) 25 MG tablet Take 25 mg by mouth at bedtime.     Marland Kitchen ketoconazole (NIZORAL) 2 % cream Apply 1 application topically 2 (two) times daily.    Marland Kitchen KLOR-CON M20 20 MEQ tablet Take 40 mEq by mouth daily.  1  . lidocaine (XYLOCAINE) 2 % solution 2 TSP  PO 30 MINS QAC AND HS PRN FOR CHEST PAIN. MAY REPEAT DOSE EVERY 4 HOURS. NO MORE THAN 8 DOSES A DAY. (Patient taking differently: 2 TSP  BY MOUTH EVERY 4 HOURS AS NEEDED FOR CHEST PAIN. MAY REPEAT DOSE EVERY 4 HOURS. NO MORE THAN 8 DOSES A DAY.) 300 mL 1  . lubiprostone (AMITIZA) 24 MCG capsule Take 24 mcg by mouth 2 (two) times daily. As needed     . Multiple Vitamin (MULTIVITAMIN WITH MINERALS) TABS tablet Take 1 tablet by mouth daily at 12 noon.     . pantoprazole (PROTONIX) 40 MG tablet Take 40 mg by mouth daily.     [name] Glycol-Propyl Glycol (SYSTANE OP) Apply 1-2 drops to eye daily as needed (dry eyes).    . psyllium (METAMUCIL) 58.6 % powder Take 1 packet by mouth 3 (three) times daily.    . PSYLLIUM PO Take by mouth.    . senna-docusate (SENOKOT-S) 8.6-50 MG per tablet Take 4 tablets by mouth at bedtime.    . sildenafil (REVATIO) 20 MG tablet Take 100 mg by mouth daily as needed (erectile dysfunction).    . sodium chloride (OCEAN) 0.65 % SOLN nasal spray Place 1 spray into both nostrils as needed for congestion.    . terbinafine (LAMISIL) 1 % cream Apply 1 application topically 2 (two) times daily as needed.    . terbinafine (LAMISIL) 250 MG tablet Take 1 tablet by mouth daily.    . VENTOLIN HFA 108 (90 BASE) MCG/ACT inhaler Inhale 2 puffs into the lungs every 4 (four) hours as needed for wheezing or shortness of breath.     .  Plecanatide (TRULANCE) 3 MG TABS Take 1 tablet by mouth daily. 14 tablet 0   No current facility-administered medications for this visit.     Allergies as of 05/09/2017 - Review Complete 05/09/2017  Allergen Reaction Noted  . Bacitracin Hives 02/28/2017  . Flagyl [metronidazole] Itching 06/29/2015  . Neosporin [neomycin-bacitracin zn-polymyx] Hives 02/28/2017  . Penicillins Itching 06/29/2015    Family History  Problem Relation Age of Onset  . Allergic rhinitis Mother   . Eczema Mother   . Urticaria Mother   . Asthma Father   . Colon cancer Neg Hx   . Colon polyps Neg Hx     Social History   Social History  .  Marital status: Divorced    Spouse name: N/A  . Number of children: N/A  . Years of education: N/A   Social History Main Topics  . Smoking status: Former Smoker    Packs/day: 1.00    Years: 34.00    Quit date: 07/14/2001  . Smokeless tobacco: Never Used     Comment: Quit in 2002  . Alcohol use No  . Drug use: No  . Sexual activity: Not Asked   Other Topics Concern  . None   Social History Narrative   ORIGINALLY FROM [place]. USED TO LOVE TO VACATION IN [place]: [place], [place].    JOB: RESEARCH TECHNICIAN, DEGREE IN COMPUTER SCIENCE, SOLD INSURANCE.      2 SONS: AGER 43, 37 AND THEY LIVE IN [place] WITH EX-WIFE.      RARE ETOH OR SWEETS. HAS A TWIN BROTHER WITH DIABETES.    Review of Systems: Complete ROS negative except as per HPI.   Physical Exam: BP 117/64   Pulse 85   Temp 97.1 F (36.2 C) (Oral)   Ht 5\' 6"  (1.676 m)   Wt 160 lb 3.2 oz (72.7 kg)   BMI 25.86 kg/m  General:   Alert and oriented. Pleasant and cooperative. Well-nourished and well-developed.  Eyes:  Without icterus, sclera clear and conjunctiva pink.  Ears:  Normal auditory acuity. Cardiovascular:  S1, S2 present without murmurs appreciated. Extremities without clubbing or edema. Respiratory:  Clear to auscultation bilaterally. No wheezes, rales, or rhonchi. No distress.    Gastrointestinal:  +BS, soft, and non-distended. Mild TTP lower abdomen. No HSM noted. No guarding or rebound. No masses appreciated.  Rectal:  Deferred  Musculoskalatal:  Symmetrical without gross deformities. Neurologic:  Alert and oriented x4;  grossly normal neurologically. Psych:  Alert and cooperative. Normal mood and affect. Heme/Lymph/Immune: No excessive bruising noted.    05/09/2017 10:34 AM   Disclaimer: This note was dictated with voice recognition software. Similar sounding words can inadvertently be transcribed and may not be corrected upon review.

## 2017-05-09 NOTE — Progress Notes (Signed)
CC'ED TO PCP 

## 2017-05-09 NOTE — Patient Instructions (Signed)
1. Continue fiber and water supplementation. 2. Continue over-the-counter Colace stool softener. 3. Stop taking Amitiza. 4. Start taking Trulance 3 mg. He can take this once a day, with or without food. I will provide you with samples to last 2 weeks. 5. Call in 1-2 weeks and let me know if the medication is helping. If not we will likely have you pick up Linzess to try again. 6. Return for follow-up in 6 weeks.

## 2017-05-15 ENCOUNTER — Encounter: Payer: Self-pay | Admitting: Gastroenterology

## 2017-05-15 ENCOUNTER — Telehealth: Payer: Self-pay | Admitting: Allergy & Immunology

## 2017-05-15 NOTE — Telephone Encounter (Signed)
Please call patient about ENT referral. Patient has had some issues with [facility] and would like to discuss a few things with you.

## 2017-05-16 ENCOUNTER — Encounter: Payer: Self-pay | Admitting: Allergy & Immunology

## 2017-05-16 ENCOUNTER — Telehealth: Payer: Self-pay | Admitting: Gastroenterology

## 2017-05-16 DIAGNOSIS — K5900 Constipation, unspecified: Secondary | ICD-10-CM

## 2017-05-16 MED ORDER — PLECANATIDE 3 MG PO TABS: 1.0000 | ORAL_TABLET | Freq: Every day | ORAL | 5 refills | Status: DC

## 2017-05-16 NOTE — Telephone Encounter (Signed)
Forwarding to refill box.  

## 2017-05-16 NOTE — Telephone Encounter (Signed)
LMOM to call.

## 2017-05-16 NOTE — Addendum Note (Signed)
Addended by: Gordy Levan, Galdino Hinchman A on: 05/16/2017 03:53 PM   Modules accepted: Orders

## 2017-05-16 NOTE — Telephone Encounter (Signed)
Pt called to say that the trulance samples worked and [facility] needed [facility] [address], [place], [place]

## 2017-05-16 NOTE — Telephone Encounter (Signed)
Please tell the patient is prescription was sent to the pharmacy. If he has Pharmacist, community we can send him a co-pay card. If he has Medicare or Medicaid co-pay card will not work.

## 2017-05-17 NOTE — Telephone Encounter (Signed)
PT is aware.

## 2017-05-17 NOTE — Telephone Encounter (Signed)
I emailed Mr. Failla to check in. Awaiting return email. Will try to contact tomorrow.   Salvatore Marvel, MD [facility] [place]

## 2017-05-20 MED ORDER — PLECANATIDE 3 MG PO TABS: 1.0000 | ORAL_TABLET | Freq: Every day | ORAL | 5 refills | Status: DC

## 2017-05-20 NOTE — Telephone Encounter (Signed)
Pt is aware.  

## 2017-05-20 NOTE — Telephone Encounter (Signed)
Please notify the patient that the prescription was sent to his pharmacy and corrected from sample to normal as requested.

## 2017-05-20 NOTE — Addendum Note (Signed)
Addended by: Gordy Levan, Marina Boerner A on: 05/20/2017 12:46 PM   Modules accepted: Orders

## 2017-05-20 NOTE — Telephone Encounter (Signed)
Ronnie Patrick, the prescription for Trulance was for sample. Please send to pharmacy! Thanks!

## 2017-05-21 ENCOUNTER — Other Ambulatory Visit: Payer: Self-pay

## 2017-05-21 DIAGNOSIS — K5900 Constipation, unspecified: Secondary | ICD-10-CM

## 2017-05-21 MED ORDER — PLECANATIDE 3 MG PO TABS: 1.0000 | ORAL_TABLET | Freq: Every day | ORAL | 3 refills | Status: DC

## 2017-05-21 NOTE — Telephone Encounter (Signed)
PA done and faxed to his insurance co. Waiting for response.

## 2017-05-21 NOTE — Telephone Encounter (Signed)
Pt called and said the Trulance will require a PA. We have not received a request, so I asked him to call pharmacy and have them fax info. He said he has tried and failed Amitiza, Linzess, Miralax. He has only 2 tablets left and is aware I am leaving him one box of the Trulance 3 mg ( 7 tablets). He said he will wait to pick up only if he does not have approval in the next couple of days. He will call and let me know if he does not need to pick up.

## 2017-05-22 NOTE — Telephone Encounter (Signed)
Noted  

## 2017-05-23 NOTE — Telephone Encounter (Signed)
Pt left Vm yesterday that he did not need the samples, he would be able to get his medication on time. Returned the one box of Trulance back to the shelf.

## 2017-05-28 NOTE — Telephone Encounter (Signed)
Issue addressed previously over email. See entire conversation below:   I like the [employer]:  [email or web address]  My favorites from there: Izora Gala, Melida Quitter, or Clide Dales  I honestly don't know anyone in the [place] in [facility] for [employer]  From: [email or web address]> Sent: Saturday, May 18, 2017 9:06:59 AM To: Salvatore Marvel Subject: RE: SECURE   Who would you recommend in [place] or [place]?  I haven't had much luck with specialists here in [place] and [place] lately. Please advise. Thanks!  --- Originally sent by [email or web address] on May 18, 2017 7:40 AM --- Hi there -   Dr. Leta Baptist and I share many patients. He has an office in [place]. All of his patients seem to like him. There is also a practice in [place] who co-manage a couple of patients with me [facility] - a few providers there but I have heard good things about [facility]rey).   There are also some practices in [place] as well, but I was not sure that you wanted to come down here.    Thanks, Salvatore Marvel  -----Original Message----- From: [email or web address]>  Sent: Friday, May 17, 2017 11:52 PM To: Salvatore [email or web address]> Subject: RE: SECURE  Yes, I got both messages. My original plan to go to an ENT doctor per your findings of fluid in my ears did not work out. Please recommend one or more ENT doctors and I'll check to see if they're in my network. Please specify what city/town they are located in. Thanks!   Clair Gulling  --- Originally sent by [email or web address] on May 17, 2017 11:25 PM --- Sorry - I got a security notification. These silly email systems... Just wanted to make sure that you got this message.   Fara Olden   -----Original Message----- From: Salvatore Marvel Sent: Friday, May 17, 2017 11:23 PM To: '[name]> Subject: RE: [External [place] we have been extremely busy the past couple of days. What can I do for you? I can call tomorrow around 9am if that works.   Fara Olden

## 2017-05-29 DIAGNOSIS — R1013 Epigastric pain: Secondary | ICD-10-CM | POA: Insufficient documentation

## 2017-06-04 ENCOUNTER — Ambulatory Visit: Payer: 59 | Admitting: Allergy & Immunology

## 2017-06-24 ENCOUNTER — Ambulatory Visit: Payer: Medicare HMO | Admitting: Gastroenterology

## 2017-07-04 ENCOUNTER — Encounter: Payer: Self-pay | Admitting: Gastroenterology

## 2017-07-04 ENCOUNTER — Ambulatory Visit (INDEPENDENT_AMBULATORY_CARE_PROVIDER_SITE_OTHER): Payer: Medicare HMO | Admitting: Gastroenterology

## 2017-07-04 DIAGNOSIS — K5901 Slow transit constipation: Secondary | ICD-10-CM

## 2017-07-04 DIAGNOSIS — K219 Gastro-esophageal reflux disease without esophagitis: Secondary | ICD-10-CM | POA: Diagnosis not present

## 2017-07-04 DIAGNOSIS — R1013 Epigastric pain: Secondary | ICD-10-CM | POA: Diagnosis not present

## 2017-07-04 NOTE — Patient Instructions (Addendum)
TO PREVENT CONSTIPATION:      1. CONTINUE WATER INTAKE.      2. EAT FIBER BUT REDUCE AMOUNT IF YOU ARE HAVING LOSE STOOLS.      3. CONTINUE TRULANCE. HOLD IF YOU HAVE LOOSE STOOLS.     4. CONTINUE WITH MENTAL HEALTH     5. USE SENOKOT AT BEDTIME.  CONTINUE PROTONIX. TAKE 30 MINUTES PRIOR TO BREAKFAST.  FOLLOW UP IN 3 MOS.

## 2017-07-04 NOTE — Assessment & Plan Note (Signed)
MOST OF THE TIME NOT LOOSE ENOUGH LATELY. HARD STOOL FIRST THING AND MAY BE ALL THE WAY TO DIARRHEA. AMITIZA QUIT WORKING.  TO PREVENT CONSTIPATION:      1. CONTINUE WATER INTAKE.      2. EAT FIBER BUT REDUCE AMOUNT IF YOU ARE HAVING LOSE STOOLS.      3. CONTINUE TRULANCE. HOLD IF YOU HAVE LOOSE STOOLS.     4.  USE SENNOKOT AT BEDTIME  [address]. FOLLOW UP IN 3 MOS.

## 2017-07-04 NOTE — Assessment & Plan Note (Signed)
SYMPTOMS FAIRLY WELL CONTROLLED.  CONTINUE TO MONITOR SYMPTOMS. 

## 2017-07-04 NOTE — Assessment & Plan Note (Signed)
SYMPTOMS FAIRLY WELL CONTROLLED AND EXACERBATED BY BENADRYL.  CONTINUE TO MONITOR SYMPTOMS. USE LIDOCAINE PRN. CONTINUE PROTONIX. FOLLOW UP IN 3 MOS.

## 2017-07-04 NOTE — Progress Notes (Signed)
Subjective:    Patient ID: Ronnie Patrick, male    DOB: Dec 16, 1951, 66 y.o.   MRN: [id number] Ronnie Patrick I, DO  HPI 2 days after esophageal manometry are 99.9% are gone. CAN IT CAUSE CONSTIPATION. NOW ON TRULANCE AND BETTER THAN AMITIZA AND MAY GET DIARRHEA IF HAS A AM APPT. IF EATS APPLES IT CAN WORK. TRYING TO HAVE A BM EVERY DAY BUT IF HE DOESN'T FEELS BLOATED AND CRAMPED. MAY FEEL LIKE HE DOESN'T COMPLETELY EMPTY HIS BLADDER. FEEL FRUSTRATED. IF SKIPS A DOSE THEN HE PAYS FOR IT THE NEXT DAY. NO RHYME IF ADJUSTS THE FIBER. ITCHING ISSUES AND WONDERS IF HE HAS A GLUTEN ALLERGY. NIECE HAD CELIAC AND DIED IN HER 30s. MOM TID AND NO EFFECT. MIRALAX NO EFFECT. FIBER TID. 32 OZ WATER DAILY. ON SENNA AND DOCUSATE SODIUM. FRUITS ADDED. PREDNISONE FOR RASH AND HAD INSOMNIA. NOW ITCHING WORSE. BENADRYL CAUSED HEARTBURN TO BE WORSE. 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 Problems with sedation MAR 2018: HAD ENDOSCOPY AND FELT GROGGY AND COULD HARDLY GET DRESSED. IN MAY 160 LBS AND NOW 157 LBS. APR LAST YEAR: 177 LBS. ANXIETY IS NOT CONTROLLED. DIAGNOSED WITH BIPOLAR DISORDER 1 YRS AGO. SEEING A MH SPECIALIST. HAVING TROUBLE WITH NEIGHBOR. THREATENED HIS CAT. FOR 24 HRS AFTERWARDS HAD DIARRHEA. THINKS SOME OF HIS SYMPTOMS MAY BE " PSYCHOSOMATIC". MAY HAVE ABDOMINAL CRAMPS AND SOME BETTER AFTER BM.  PT DENIES FEVER, CHILLS, HEMATOCHEZIA, HEMATEMESIS, nausea, vomiting, melena, CHEST PAIN, SHORTNESS OF BREATH,  CHANGE IN BOWEL IN HABITS, OR problems swallowing.  Past Medical History:  Diagnosis Date  . GERD (gastroesophageal reflux disease)   . HTN (hypertension)   . Hyperlipidemia   . Jock itch    LAMISIL/TERBINAFINE REQUIRED  . Lyme disease JUN 2016   Past Surgical History:  Procedure Laterality Date  . BIOPSY  02/20/2017   Procedure: BIOPSY;  Surgeon: Danie Binder, MD;  [facility];  Service: Endoscopy;;  gastric and esophageal  . COLONOSCOPY  DEC 2015 [facility]   [facility]  .  ESOPHAGOGASTRODUODENOSCOPY N/A 02/20/2017   Procedure: ESOPHAGOGASTRODUODENOSCOPY (EGD);  Surgeon: Danie Binder, MD;  [facility];  Service: Endoscopy;  Laterality: N/A;  215  . EXCISIONAL HEMORRHOIDECTOMY     AGE 57-PAINFUL  . FLEXIBLE SIGMOIDOSCOPY N/A 07/15/2015   Procedure: FLEXIBLE SIGMOIDOSCOPY;  Surgeon: Danie Binder, MD;  [facility];  Service: Endoscopy;  Laterality: N/A;  100 - moved to 12:00 - office to notify  . HEMORRHOID BANDING N/A 07/15/2015   Procedure: HEMORRHOID BANDING;  Surgeon: Danie Binder, MD;  [facility];  Service: Endoscopy;  Laterality: N/A;  . TONSILLECTOMY AND ADENOIDECTOMY     AS A CHILD  . UPPER GASTROINTESTINAL ENDOSCOPY  DEC 2015 [facility]   GERD, HH   Allergies  Allergen Reactions  . Bacitracin Hives  . Flagyl [Metronidazole] Itching  . Neosporin [Neomycin-Bacitracin Zn-Polymyx] Hives  . Penicillins Itching       Current Outpatient Prescriptions  Medication Sig Dispense Refill  . acetaminophen (TYLENOL) 500 MG tablet Take 500 mg by mouth 2 (two) times daily.    Marland Kitchen acyclovir (ZOVIRAX) 400 MG tablet Take 400 mg by mouth 2 (two) times daily.    Marland Kitchen atorvastatin (LIPITOR) 40 MG tablet Take 40 mg by mouth daily.    . Azelastine-Fluticasone 137-50 MCG/ACT SUSP Place 2 sprays into the nose 2 (two) times daily.    . calcium elemental as carbonate (TUMS ULTRA 1000) 400 MG tablet  Chew 3,000 mg by mouth as needed for heartburn. Reported on 04/05/2016    . docusate sodium (COLACE) 100 MG capsule Take 100 mg by mouth at bedtime.    Marland Kitchen emtricitabine-tenofovir (TRUVADA) 200-300 MG tablet Take 1 tablet by mouth daily with lunch.     . fexofenadine (ALLEGRA) 180 MG tablet Take 180 mg by mouth 2 (two) times daily.     . fluticasone (CUTIVATE) 0.05 % cream APPLY TO AFFECTED AREA BID  3  . hydrochlorothiazide (HYDRODIURIL) 25 MG tablet Take 25 mg by mouth at bedtime.     Marland Kitchen ketoconazole (NIZORAL) 2 % cream Apply 1 application topically 2  (two) times daily.    Marland Kitchen KLOR-CON M20 20 MEQ tablet Take 40 mEq by mouth daily.  1  . lidocaine (XYLOCAINE) 2 % solution 2 TSP  PO 30 MINS QAC AND HS PRN FOR CHEST PAIN. MAY REPEAT DOSE EVERY 4 HOURS. NO MORE THAN 8 DOSES A DAY.     . Multiple Vitamin (MULTIVITAMIN WITH MINERALS) TABS tablet Take 1 tablet by mouth daily at 12 noon.     . pantoprazole (PROTONIX) 40 MG tablet Take 40 mg by mouth daily.     Marland Kitchen Plecanatide (TRULANCE) 3 MG TABS Take 1 tablet by mouth daily.    [name] Glycol-Propyl Glycol (SYSTANE OP) Apply 1-2 drops to eye daily as needed (dry eyes).    . predniSONE (DELTASONE) 10 MG tablet Take 10 mg by mouth 2 (two) times daily. Taper dose    . psyllium (METAMUCIL) 58.6 % powder Take 1 packet by mouth 3 (three) times daily.    . PSYLLIUM PO Take by mouth.    . senna-docusate (SENOKOT-S) 8.6-50 MG per tablet Take 4 tablets by mouth at bedtime.    . sildenafil (REVATIO) 20 MG tablet Take 100 mg by mouth daily as needed (erectile dysfunction).    . sodium chloride (OCEAN) 0.65 % SOLN nasal spray Place 1 spray into both nostrils as needed for congestion.    .      Enid Cutter HFA 108 (90 BASE) MCG/ACT inhaler Inhale 2 puffs into the lungs every 4 (four) hours as needed for wheezing or shortness of breath.     .      . terbinafine (LAMISIL) 250 MG tablet Take 1 tablet by mouth daily.     Review of Systems PER HPI OTHERWISE ALL SYSTEMS ARE NEGATIVE.    Objective:   Physical Exam  Constitutional: He is oriented to person, place, and time. He appears well-developed and well-nourished. No distress.  HENT:  Head: Normocephalic and atraumatic.  Mouth/Throat: Oropharynx is clear and moist. No oropharyngeal exudate.  Eyes: Pupils are equal, round, and reactive to light. No scleral icterus.  Neck: Normal range of motion. Neck supple.  Cardiovascular: Normal rate, regular rhythm and normal heart sounds.   Pulmonary/Chest: Effort normal and breath sounds normal. No respiratory distress.   Abdominal: Soft. Bowel sounds are normal. He exhibits no distension. There is no tenderness.  Musculoskeletal: He exhibits no edema.  Lymphadenopathy:    He has no cervical adenopathy.  Neurological: He is alert and oriented to person, place, and time.  NO  NEW FOCAL DEFICITS  Psychiatric: He has a normal mood and affect.  Vitals reviewed.     Assessment & Plan:

## 2017-07-09 ENCOUNTER — Telehealth: Payer: Self-pay | Admitting: Gastroenterology

## 2017-07-09 DIAGNOSIS — R151 Fecal smearing: Secondary | ICD-10-CM

## 2017-07-09 NOTE — Assessment & Plan Note (Signed)
CHECK TTG IGA

## 2017-07-09 NOTE — Telephone Encounter (Signed)
ORDER FOR TTG IGA ENTERED. FAX ORDER TO HIS PHARMACY AND HAVE RESULTS FAXED BACK TO [place].

## 2017-07-09 NOTE — Telephone Encounter (Signed)
Forwarding to Dr.Fields.  

## 2017-07-09 NOTE — Telephone Encounter (Signed)
Lab order mailed to pt per his request. He said it is better for him to take to the lab. Our fax number listed for results.

## 2017-07-09 NOTE — Telephone Encounter (Signed)
Pt was seen last week and wants SF to order him a lab order to see if he is allergic to gluten. Please advise and call him at [phone]

## 2017-08-02 NOTE — Progress Notes (Signed)
REVIEWED-NO ADDITIONAL RECOMMENDATIONS. 

## 2017-10-10 ENCOUNTER — Ambulatory Visit (INDEPENDENT_AMBULATORY_CARE_PROVIDER_SITE_OTHER): Payer: Medicare HMO | Admitting: Gastroenterology

## 2017-10-10 ENCOUNTER — Encounter: Payer: Self-pay | Admitting: Gastroenterology

## 2017-10-10 DIAGNOSIS — K644 Residual hemorrhoidal skin tags: Secondary | ICD-10-CM | POA: Diagnosis not present

## 2017-10-10 DIAGNOSIS — R1310 Dysphagia, unspecified: Secondary | ICD-10-CM

## 2017-10-10 DIAGNOSIS — K5901 Slow transit constipation: Secondary | ICD-10-CM

## 2017-10-10 DIAGNOSIS — K219 Gastro-esophageal reflux disease without esophagitis: Secondary | ICD-10-CM | POA: Diagnosis not present

## 2017-10-10 DIAGNOSIS — R1319 Other dysphagia: Secondary | ICD-10-CM

## 2017-10-10 NOTE — Assessment & Plan Note (Signed)
SYMPTOMS CONTROLLED/RESOLVED ON PROTONIX DAILY.  CONTINUE PROTONIX. TAKE 30 MINUTES PRIOR TO BREAKFAST. CONTINUE TO MONITOR SYMPTOMS. FOLLOW UP IN 4 MOS.

## 2017-10-10 NOTE — Progress Notes (Signed)
Subjective:    Patient ID: Ronnie Patrick, male    DOB: August 29, 1951, 66 y.o.   MRN: [id number]  [name], DO   HPI Bowels: now having diarrhea DUE TO TRULANCE. CUT PILL IN HALF GETS DIARRHEA AND DOESN'T LAST AS LONG. STOPPED FIBER OVER 1 MO AGO. SEEMS TO HAVE NO EFFECT. STARTED EXERCISING: 30 MINS A DAY. DRINKING MORE WATER. SUGGESTIONS ABOUT REGARDING DIARRHEA. HEARTBURN HARDLY EVER. ONE TIME CHEST PAIN SINCE LAST VISIT WITH VISCOUS LIDOCAINE. WINE CAUSES PROBLEMS. ONE Wenden GRIGIO. HAS A 67 YO LOVER. PCP WHY HE'S LOSING WEIGHT. EXTENSIVE WORKUP: CALORIE INTAKE/EATING HEALTHIER. BELIEVES HE'S ~145 LBS WITHOUT CLOTHES ON. EATING MORE FIBER. CHOCOLATE CAN CONSTIPATE HIM. GETTING A NEW PCP AND NEW PSYCH. HAVING PROBLEMS WITH DOUCHING BEFORE ANAL SEX. MAY HAVE TO DO IT SEVERAL TIMES AND IT MAY NOT BE CLEAR. HAS ADOPTED CLEAR TWICE IN A ROW AND STOPPED. ALSO TRIED ENEMA OR RECTAL SYRINGE. QUESTION ABOUT PROTONIX. HEMORRHOIDS MAY FLARE BUT NO ITCHING OR BURNING RECENTLY. TWICE GOT BAD PAIN IN RECTUM( LASTED 5-10 MINS, GETS WORSE AND GETS BETTER).  PT DENIES FEVER, CHILLS, HEMATOCHEZIA, nausea, vomiting, melena, diarrhea, SHORTNESS OF BREATH, CHANGE IN BOWEL IN HABITS, abdominal pain, problems swallowing, OR heartburn or indigestion.  Past Medical History:  Diagnosis Date  . Basal cell carcinoma (BCC)   . GERD (gastroesophageal reflux disease)   . HTN (hypertension)   . Hyperlipidemia   . Jock itch    LAMISIL/TERBINAFINE REQUIRED  . Lyme disease JUN 2016   Past Surgical History:  Procedure Laterality Date  . BIOPSY  02/20/2017   Procedure: BIOPSY;  Surgeon: Danie Binder, MD;  [facility];  Service: Endoscopy;;  gastric and esophageal  . COLONOSCOPY  DEC 2015 [facility]   [facility]  . ESOPHAGOGASTRODUODENOSCOPY N/A 02/20/2017   Procedure: ESOPHAGOGASTRODUODENOSCOPY (EGD);  Surgeon: Danie Binder, MD;  [facility];  Service: Endoscopy;  Laterality: N/A;  215  .  EXCISIONAL HEMORRHOIDECTOMY     AGE 59-PAINFUL  . FLEXIBLE SIGMOIDOSCOPY N/A 07/15/2015   Procedure: FLEXIBLE SIGMOIDOSCOPY;  Surgeon: Danie Binder, MD;  [facility];  Service: Endoscopy;  Laterality: N/A;  100 - moved to 12:00 - office to notify  . HEMORRHOID BANDING N/A 07/15/2015   Procedure: HEMORRHOID BANDING;  Surgeon: Danie Binder, MD;  [facility];  Service: Endoscopy;  Laterality: N/A;  . TONSILLECTOMY AND ADENOIDECTOMY     AS A CHILD  . UPPER GASTROINTESTINAL ENDOSCOPY  DEC 2015 [facility]   GERD, HH    Allergies  Allergen Reactions  . Bacitracin Hives  . Flagyl [Metronidazole] Itching  . Neosporin [Neomycin-Bacitracin Zn-Polymyx] Hives  . Penicillins Itching        Current Outpatient Prescriptions  Medication Sig Dispense Refill  . acetaminophen (TYLENOL) 500 MG tablet Take 500 mg by mouth 2 (two) times daily.    Marland Kitchen acyclovir (ZOVIRAX) 400 MG tablet Take 400 mg by mouth 2 (two) times daily.    Marland Kitchen atorvastatin (LIPITOR) 40 MG tablet Take 40 mg by mouth daily.    . Azelastine-Fluticasone 137-50 MCG/ACT SUSP Place 2 sprays into the nose 2 (two) times daily.    . TUMS ULTRA 1000) 400 MG tablet Chew 3,000 mg PRN as needed for heartburn.     .      Noah Charon) 200-300 MG tablet Take 1 tablet by mouth daily with lunch.     . fexofenadine (ALLEGRA) 180 MG tablet Take 180 mg  by mouth 2 (two) times daily.     . fluticasone (CUTIVATE) 0.05 % cream APPLY TO AFFECTED AREA BID    . hydrochlorothiazide  25 MG tablet Take 25 mg by mouth at bedtime.     Marland Kitchen ketoconazole (NIZORAL) 2 % cream Apply 1 application topically 2 (two) times daily.    Marland Kitchen KLOR-CON M20 20 MEQ tablet Take 40 mEq by mouth daily.    Marland Kitchen lidocaine (XYLOCAINE) 2 % solution  2 TSP  Q4H PRN FOR CHEST PAIN    . MVI Take 1 tablet by mouth daily at 12 noon.     . pantoprazole (PROTONIX) 40 MG tablet Take 40 mg by mouth daily.     Marland Kitchen Plecanatide (TRULANCE) 3 MG TABS Take 1 tablet by mouth daily.     SYSTANE  Apply 1-2 drops to eye daily as needed (dry eyes).    .      . psyllium (METAMUCIL) 58.6 % powder Take 1 packet by mouth 3 (three) times daily.    Marland Kitchen PERI-COLACE 100 MG QHS    . SENOKOT 8.6-50 MG per tablet Take 4 tablets by mouth at bedtime.    . sildenafil (REVATIO) 20 MG tablet Take 100 mg by mouth daily PRN    .      . terbinafine (LAMISIL) 1 % cream Apply 1 application topically 2 (two) times daily as needed.    . terbinafine (LAMISIL) 250 MG tablet Take 1 tablet by mouth daily.    . VENTOLIN HFA 108  MCG/ACT inhaler Inhale 2 puffsQ4H PRN for wheezing or shortness of breath.      Review of Systems PER HPI OTHERWISE ALL SYSTEMS ARE NEGATIVE.    Objective:   Physical Exam  Constitutional: He is oriented to person, place, and time. He appears well-developed and well-nourished. No distress.  HENT:  Head: Normocephalic and atraumatic.  Mouth/Throat: Oropharynx is clear and moist. No oropharyngeal exudate.  Eyes: Pupils are equal, round, and reactive to light. No scleral icterus.  Neck: Normal range of motion. Neck supple.  Cardiovascular: Normal rate, regular rhythm and normal heart sounds.   Pulmonary/Chest: Effort normal and breath sounds normal. No respiratory distress.  Abdominal: Soft. Bowel sounds are normal. He exhibits no distension. There is no tenderness.  Musculoskeletal: He exhibits no edema.  Lymphadenopathy:    He has no cervical adenopathy.  Neurological: He is alert and oriented to person, place, and time.  NO FOCAL DEFICITS  Skin:  DRY DJRINE ON TOP OF HEAD  Psychiatric: Judgment and thought content normal.  SLIGHTLY MANIC MOOD, NL AFFECT   Vitals reviewed.     Assessment & Plan:

## 2017-10-10 NOTE — Assessment & Plan Note (Signed)
SYMPTOMS CONTROLLED/RESOLVED.  CONTINUE TO MONITOR SYMPTOMS. 

## 2017-10-10 NOTE — Assessment & Plan Note (Signed)
SYMPTOMS NOT IDEALLY CONTROLLED ON TRULANCE, WATER, AND FIBER. NOW AS DIARRHEA.  HOLD TRULANCE. USE IF NEEDED FOR CONSTIPATION. DRINK WATER TO KEEP YOUR URINE LIGHT YELLOW. CONTINUE A HIGH FIBER DIET. FOLLOW UP IN 4 MOS.

## 2017-10-10 NOTE — Assessment & Plan Note (Signed)
WANTS EXERCISE TO TONE ABDOMEN THAT WON;T MAKE HIS HEMORRHOIDS WORSE.   DO PLANKS (FULL, HALF, SIDE) FOR 5 MINS A DAY TO GET YOU MIDRIFF IN SHAPE. CHECK OUT YOU TUBE FOR VIDEO INSTRUCTION. PLANKS DEMONSTRATED IN OFC.

## 2017-10-10 NOTE — Patient Instructions (Signed)
HOLD TRULANCE. USE IF NEEDED FOR CONSTIPATION.  DRINK WATER TO KEEP YOUR URINE LIGHT YELLOW.  CONTINUE A HIGH FIBER DIET.  DO PLANKS (FULL, HALF, SIDE) FOR 5 MINS A DAY TO GET YOU MIDRIFF IN SHAPE. CHECK OUT YOU TUBE FOR 5 MINS AB WORKOUT OR LOWER AB WORKOUT.  FOLLOW UP IN 4 MOS.

## 2017-10-10 NOTE — Progress Notes (Signed)
CC'D TO PCP °

## 2017-10-10 NOTE — Progress Notes (Signed)
ON RECALL  °

## 2017-10-18 ENCOUNTER — Telehealth: Payer: Self-pay

## 2017-10-18 NOTE — Telephone Encounter (Signed)
PLEASE CALL PT. He should drink 1/4 cup ginger beer after he finishes his wine.

## 2017-10-18 NOTE — Telephone Encounter (Signed)
PLEASE CALL PT. He can also try drinking 1/4 cu ginger beer 15 mins prior to drinking his wine. He can see which one works better for him.

## 2017-10-18 NOTE — Telephone Encounter (Signed)
Pt called, he said he bought some diet ginger beer and he has tried it and he actually likes it. He wanted to know how he should take it with wine. He wanted to know if he should mix it in the wine? Or should he drink it before the wine? He is concerned it will alter the taste of the wine. He normally drinks around 5 oz.

## 2017-10-18 NOTE — Telephone Encounter (Signed)
Spoke with Ronnie Patrick and he would like clarity on drinking Ginger Beer with his Wine to prevent stomach sensitivity. Ronnie Patrick usually gets stomach sensitivity when drinking wine by itself. Ronnie Patrick doesn't want it to alter the taste of his wine and he doesn't want stomach sensitivity either.  Please advise

## 2017-10-21 NOTE — Telephone Encounter (Signed)
PT is aware.

## 2017-12-19 ENCOUNTER — Encounter: Payer: Self-pay | Admitting: Gastroenterology

## 2018-01-02 ENCOUNTER — Telehealth: Payer: Self-pay | Admitting: Gastroenterology

## 2018-01-02 DIAGNOSIS — K219 Gastro-esophageal reflux disease without esophagitis: Secondary | ICD-10-CM

## 2018-01-02 MED ORDER — PANTOPRAZOLE SODIUM 40 MG PO TBEC: 40.0000 mg | DELAYED_RELEASE_TABLET | Freq: Every day | ORAL | 3 refills | Status: DC

## 2018-01-02 NOTE — Telephone Encounter (Signed)
Pt called to say that he is switching pharmacies and he will need a refill on his pantoprazole. He will be using [facility] o) [phone] and f) [phone] He will need a 3 month supply and has enough for the next 8 days. Any questions call him at [phone]

## 2018-01-02 NOTE — Telephone Encounter (Signed)
Rx sent to pharmacy per request.  

## 2018-01-02 NOTE — Telephone Encounter (Signed)
Forwarding to refill box. I have entered the correct pharmacy.

## 2018-01-03 NOTE — Telephone Encounter (Signed)
PT is aware.

## 2018-02-12 ENCOUNTER — Ambulatory Visit: Payer: Medicare HMO | Admitting: Gastroenterology

## 2018-02-12 ENCOUNTER — Encounter: Payer: Self-pay | Admitting: Gastroenterology

## 2018-02-12 DIAGNOSIS — R198 Other specified symptoms and signs involving the digestive system and abdomen: Secondary | ICD-10-CM

## 2018-02-12 DIAGNOSIS — K219 Gastro-esophageal reflux disease without esophagitis: Secondary | ICD-10-CM | POA: Diagnosis not present

## 2018-02-12 DIAGNOSIS — K644 Residual hemorrhoidal skin tags: Secondary | ICD-10-CM

## 2018-02-12 NOTE — Progress Notes (Signed)
Subjective:    Patient ID: Ronnie Patrick, male    DOB: July 14, 1951, 67 y.o.   MRN: [id number]  Tamera Punt, DO  HPI 4th STD: CHLAMYDIA. HAD THREE OTHERS. BOWELS: GONE FROM CONSTIPATION TO THE OPPOSITE( FIRST THING IN AM: #4 AND MAY BE HARD/RECTALBLEEIDNG SOMETIMES, 2ND TIME: #7). STOPPED STOOL SOFTENER. NO TRULANCE OR AMITIZA. ABX; AZITHROMYCIN ONE DOSE. HAPPENED BEFORE THAT. [place]. THIS AM HAD SOFT STOOL. ATE BRUNCH AROUND NOON. WENT TO BATHROOM: CLUMPY COMING. DINNER: ROMAINE LETTUCE, CUCUMBER, GRAPE TOMATO, SHREDDED SWISS CHEESE, GREEN BEANS, 2 TBSP RANCH DRESSING, BRUNCH:OMELET/PEPPERSONIONS/TOMATO AND WHOLE WHEAT TOAST. ZUCCHINI ONIONS/TOMATO/PARMESAN CHEESE/ MILK WITH COFFEE. CHANGED FROM FRIED TO AIR FRYER. BUTTOCK IS BETTER. TAKING 4 SENNA AT NIGHT. GASSY MORE THAN USED TO BE. WHEN HE WAS YOUNGER HE HAD A PROBLEM WITH DAIRY. WAS ON PREDNISONE FOR FOOT IN PAST. GOT A NEW CAT: SIAMESE AND ANOTHER MALE CAT: OLD. HAD ONE MALE CAT THAT DIET. RECTAL DISCHARGE IMPROVED.    Past Medical History:  Diagnosis Date  . Basal cell carcinoma (BCC)   . GERD (gastroesophageal reflux disease)   . HTN (hypertension)   . Hyperlipidemia   . Jock itch    LAMISIL/TERBINAFINE REQUIRED  . Lyme disease JUN 2016   Past Surgical History:  Procedure Laterality Date  . BIOPSY  02/20/2017     . COLONOSCOPY  DEC 2015 [facility]   [facility]  . ESOPHAGOGASTRODUODENOSCOPY N/A 02/20/2017     . EXCISIONAL HEMORRHOIDECTOMY     AGE 52-PAINFUL  . FLEXIBLE SIGMOIDOSCOPY N/A 07/15/2015     . HEMORRHOID BANDING N/A 07/15/2015     . TONSILLECTOMY AND ADENOIDECTOMY     AS A CHILD  . UPPER GASTROINTESTINAL ENDOSCOPY  DEC 2015 [facility]   GERD, HH   Allergies  Allergen Reactions  . Bacitracin Hives  . Flagyl [Metronidazole] Itching  . Neosporin [Neomycin-Bacitracin Zn-Polymyx] Hives  . Penicillins Itching       Current Outpatient Medications  Medication Sig Dispense Refill  . acetaminophen (TYLENOL) 500  MG tablet Take 500 mg by mouth 2 (two) times daily.    Marland Kitchen acyclovir (ZOVIRAX) 400 MG tablet Take 400 mg by mouth 2 (two) times daily.    Marland Kitchen atorvastatin (LIPITOR) 40 MG tablet Take 40 mg by mouth daily.    . Azelastine-Fluticasone 137-50 MCG/ACT SUSP Place 2 sprays into the nose 2 (two) times daily.    . calcium elemental as carbonate (TUMS ULTRA 1000) 400 MG tablet Chew 3,000 mg by mouth as needed for heartburn. Reported on 04/05/2016    . emtricitabine-tenofovir (TRUVADA) 200-300 MG tablet Take 1 tablet by mouth daily with lunch.     . fexofenadine (ALLEGRA) 180 MG tablet Take 180 mg by mouth daily.     . fluticasone (CUTIVATE) 0.05 % cream APPLY TO AFFECTED AREA BID. takes daily    . ketoconazole (NIZORAL) 2 % cream Apply 1 application topically daily.     Marland Kitchen lidocaine (XYLOCAINE) 2 % solution 2 TSP  PO 30 MINS QAC AND HS PRN FOR CHEST PAIN.     . Multiple Vitamin (MULTIVITAMIN WITH MINERALS) TABS tablet Take 1 tablet by mouth daily at 12 noon.     . pantoprazole 40 MG tablet Take 1 daily.    [name] Glycol-Propyl Glycol (SYSTANE OP) Apply 1-2 drops to eye daily as needed (dry eyes).    Marland Kitchen senna (SENOKOT) 8.6 MG TABS tablet Take 4 tablets by mouth at bedtime.    . sildenafil (REVATIO) 20 MG tablet  Take 100 mg by mouth daily as needed (erectile dysfunction).    . sodium chloride (OCEAN) 0.65 % SOLN nasal spray Place 1 spray into both nostrils as needed for congestion.    . terbinafine (LAMISIL) 1 % cream Apply 1 application topically 2 (two) times daily as needed.    . VENTOLIN HFA 108 (90 BASE) MCG/ACT inhaler Inhale 2 puffs into the lungs every 4 (four) hours as needed for wheezing or shortness of breath.     . docusate sodium (COLACE) 100 MG capsule Take 100 mg by mouth at bedtime.    . hydrochlorothiazide (HYDRODIURIL) 25 MG tablet Take 25 mg by mouth at bedtime.     Marland Kitchen KLOR-CON M20 20 MEQ tablet Take 40 mEq by mouth daily.    .      .      . psyllium (METAMUCIL) 58.6 % powder Take 1  packet by mouth 3 (three) times daily.    . PSYLLIUM PO Take by mouth.    . senna-docusate (SENOKOT-S) 8.6-50 MG per tablet Take 4 tablets by mouth at bedtime.    . terbinafine (LAMISIL) 250 MG tablet Take 1 tablet by mouth daily.     Review of Systems PER HPI OTHERWISE ALL SYSTEMS ARE NEGATIVE.    Objective:   Physical Exam  Constitutional: He is oriented to person, place, and time. He appears well-developed and well-nourished. No distress.  HENT:  Head: Normocephalic and atraumatic.  Mouth/Throat: Oropharynx is clear and moist. No oropharyngeal exudate.  Eyes: Pupils are equal, round, and reactive to light. No scleral icterus.  Neck: Normal range of motion. Neck supple.  Cardiovascular: Normal rate, regular rhythm and normal heart sounds.  Pulmonary/Chest: Effort normal and breath sounds normal. No respiratory distress.  Abdominal: Soft. Bowel sounds are normal. He exhibits no distension. There is no tenderness.  Musculoskeletal: He exhibits no edema.  Lymphadenopathy:    He has no cervical adenopathy.  Neurological: He is alert and oriented to person, place, and time.  NO FOCAL DEFICITS  Psychiatric:  SLIGHTLY ANXIOUS MOOD, NL AFFECT  Vitals reviewed.     Assessment & Plan:

## 2018-02-12 NOTE — Assessment & Plan Note (Signed)
SYMPTOMS NOT IDEALLY CONTROLLED AND MOST LIKELY DUE TO STRESS AND FIBER/DAIRY INTAKE AND NIGHTLY SENNA.  DRINK WATER TO KEEP YOUR URINE LIGHT YELLOW. EAT FIBER. Cut SENNA DOWN TO 2 AT BEDTIME.  IF YOU CONSUME DAIRY AND ARE HAVING LOOSE STOOLS, ADD LACTASE 3 PILLS WITH MEALS UP TO THREE TIMES A DAY.  FOLLOW UP IN 6 MOS.  PLEASE CALL WITH QUESTIONS OR CONCERNS.  GREATER THAN 50% WAS SPENT IN COUNSELING & COORDINATION OF CARE WITH THE PATIENT: DISCUSSED DIFFERENTIAL DIAGNOSIS, BENEFITS, RISKS, AND MANAGEMENT OF INTERMITTENT LOOSE STOOL/CONSTIPATION; TOTAL ENCOUNTER TIME: 25 MINS.

## 2018-02-12 NOTE — Assessment & Plan Note (Signed)
SYMPTOMS FAIRLY WELL CONTROLLED.  CONTINUE TO MONITOR SYMPTOMS. 

## 2018-02-12 NOTE — Assessment & Plan Note (Signed)
SYMPTOMS CONTROLLED/RESOLVED. CONTINUES WITH ANAL INTERCOURSE AND NOW USES CONDOMS AND INSISTS ON CONDOMS IF HE RECEIVES.  CONTINUE TO MONITOR SYMPTOMS.

## 2018-02-12 NOTE — Assessment & Plan Note (Signed)
SYMPTOMS CONTROLLED/RESOLVED.  CONTINUE TO MONITOR SYMPTOMS. 

## 2018-02-12 NOTE — Patient Instructions (Signed)
DRINK WATER TO KEEP YOUR URINE LIGHT YELLOW.  EAT FIBER.  Cut SENNA DOWN TO 2 AT BEDTIME.   IF YOU CONSUME DAIRY AND ARE HAVING LOOSE STOOLS, ADD LACTASE 3 PILLS WITH MEALS UP TO THREE TIMES A DAY.   FOLLOW UP IN 6 MOS.   PLEASE CALL WITH QUESTIONS OR CONCERNS.

## 2018-02-13 NOTE — Progress Notes (Signed)
ON RECALL  °

## 2018-02-13 NOTE — Progress Notes (Signed)
cc'ed to pcp °

## 2018-02-20 ENCOUNTER — Telehealth: Payer: Self-pay | Admitting: Gastroenterology

## 2018-02-20 NOTE — Telephone Encounter (Signed)
[phone] PLEASE CALL PATIENT ABOUT HIS LACTOSE PILLS, HE WANTS TO KNOW IF HE IS LACTOSE INTOLERANT BEFORE HE CONTINUES BUYING THE PILLS BECAUSE THEY ARE EXPENSIVE, WANTS TO HAVE LABS DONE OR A BREATH TEST

## 2018-02-24 NOTE — Telephone Encounter (Signed)
Spoke with pt and he doesn't want to purchase another pill(lactuolose) without finding out if he is actually lactose intolerance. Pt also stated that he isn't sure why he hasn't been tested to see if he is lactose intolerant. Pt wants to have lab work to find out.

## 2018-02-25 ENCOUNTER — Other Ambulatory Visit: Payer: Self-pay

## 2018-02-25 DIAGNOSIS — R198 Other specified symptoms and signs involving the digestive system and abdomen: Secondary | ICD-10-CM

## 2018-02-25 NOTE — Telephone Encounter (Signed)
Routing message 

## 2018-02-25 NOTE — Telephone Encounter (Signed)
Referral info faxed to [facility]. Called and informed pt.

## 2018-02-25 NOTE — Telephone Encounter (Signed)
Noted. Discussed with pt. Pt states he's not trying to give me a hard time. Pt states when you say the test isn't available, do you mean it's not available in [place]? Pt wants to be referred to [facility] or somewhere where testing can be done. Pt states he's not going to stop dairy because he loves dairy and he's not going to purchase Lactase pills and the not work and he's stuck with a bottle of medication. Discussed stopping dairy to see pts symptoms improve, pt stated he's not doing that. Pt said he has researched this and knows that he can have testing done.   As the call was ended, pt said he'll wait on a return call.

## 2018-02-25 NOTE — Telephone Encounter (Signed)
REFER TO [facility] FOR LACTOSE INTOLERANCE.

## 2018-02-25 NOTE — Telephone Encounter (Signed)
PLEASE CALL PT. THERE IS NO LAB TEST FOR LACTOSE INTOLERANCE. THERE IS A BREATH TEST BU THAT IS NOT AVAILABLE. IT A CLINICAL DIAGNOSIS. SYMPTOMS OF LACTOSE INTOLERANCE INCLUDE BLOATING, GAS, DIARRHEA, AND LOOSE STOOLS AFTER EATING DAIRY PRODUCTS. IF YOU CUT OUT DAIRY AND YOUR GI SYMPTOMS IMPROVE YOU ARE LACTOSE INTOLERANT. HE SHOULD ADD LACTASE 3 PILLS WITH MEALS UP TO THREE TIMES A DAY.

## 2018-02-26 NOTE — Telephone Encounter (Signed)
Pt left VM that he has an appt for the Breath Test at [facility] on 04/09/2018.

## 2018-02-28 NOTE — Telephone Encounter (Signed)
REVIEWED-NO ADDITIONAL RECOMMENDATIONS. 

## 2018-03-24 ENCOUNTER — Telehealth: Payer: Self-pay | Admitting: Gastroenterology

## 2018-03-24 NOTE — Telephone Encounter (Signed)
I spoke to pt and he is scheduled for the Hydrogen Breath Test at [facility] on 04/09/2018. He still feels that he is not intolerant to Lactose.He finally got the Lactaid pills and has been taking 3 tablets two to three times a day for almost a week,  as Dr. Oneida Alar had recommended. He said it has not helped his diarrhea at all. He is still having diarrhea several times a day most every day. He feels because of this that he is not intolerant to Lactose and he would like someone else's opinion on it. He is aware that Dr. Oneida Alar is on vacation. I am forwarding to Roseanne Kaufman, NP to advise.

## 2018-03-24 NOTE — Telephone Encounter (Signed)
I can't give an opinion because I have not seen him in clinic for this issue. He needs to keep the appt at [facility]. If he is having persistent diarrhea several times a day, check stool culture and Cdiff.

## 2018-03-24 NOTE — Telephone Encounter (Signed)
Please call patient at [phone]. He has questions for the nurse about being lactose intolerant and having diarrhea.

## 2018-03-25 NOTE — Telephone Encounter (Signed)
PT is aware to keep appt at [facility]. He said today he has diarrhea once a day and sometimes twice. He would like for Dr. Oneida Alar to call him when she returns, prior to his appt at [facility].

## 2018-04-02 NOTE — Telephone Encounter (Signed)
PT FEELS CONFUSED ABOUT WHETHER HE HAS LACTOSE INTOLERNACE.  HAS APPT AT [facility]. BOUGHT ONE BOTTLE OF LACTASE AND DIDN'T CHANGE HIS DIET. IF THE PILLS DON'T WORK DOES HE NEED TO HAVE THE TEST. EATS AND HAS DIARRHEA(CHEESE/MILK/COFFEE) MAY HAVE CONSTIPATION ON CHEESE. JUST GOING TO [facility] FOR THE LACTOSE INTOLERANCE.   TEST SCHEDULED FOR APR 24 AND PT AWARE HIS RESULTS WILL BE BACK BY MAY 1.

## 2018-04-09 ENCOUNTER — Encounter: Payer: Self-pay | Admitting: Gastroenterology

## 2018-04-24 ENCOUNTER — Telehealth: Payer: Self-pay | Admitting: Gastroenterology

## 2018-04-24 NOTE — Telephone Encounter (Signed)
PLEASE CALL PT. I reviewed the report. HE IS LACTOSE INOTERRANT. CONTINUE PROTONIX.   FOLLOW A DAIRY FREE DIET. SEND INFO BELOW OR HE CAN LOOK IT UP ON THE INTERNET: [email or web address].  IF HE FEELS LIKE EATING DAIRY, TAKE 2 OR 3 LACTASE PILLS, EAT LACTOSE FREE ICE CREAM, OR DRINK LACTAID MILK.     Lactose Free Diet Lactose is a carbohydrate that is found mainly in milk and milk products, as well as in foods with added milk or whey. Lactose must be digested by the enzyme in order to be used by the body. Lactose intolerance occurs when there is a shortage of lactase. When your body is not able to digest lactose, you may feel sick to your stomach (nausea), bloating, cramping, gas and diarrhea.  There are many dairy products that may be tolerated better than milk by some people:  The use of cultured dairy products such as yogurt, buttermilk, cottage cheese, and sweet acidophilus milk (Kefir) for lactase-deficient individuals is usually well tolerated. This is because the healthy bacteria help digest lactose.   Lactose-hydrolyzed milk (Lactaid) contains 40-90% less lactose than milk and may also be well tolerated.    SPECIAL NOTES  Lactose is a carbohydrates. The major food source is dairy products. Reading food labels is important. Many products contain lactose even when they are not made from milk. Look for the following words: whey, milk solids, dry milk solids, nonfat dry milk powder. Typical sources of lactose other than dairy products include breads, candies, cold cuts, prepared and processed foods, and commercial sauces and gravies.   All foods must be prepared without milk, cream, or other dairy foods.   Soy milk and lactose-free supplements (LACTASE) may be used as an alternative to milk.   FOOD GROUP ALLOWED/RECOMMENDED AVOID/USE SPARINGLY  BREADS / STARCHES 4 servings or more* Breads and rolls made without milk. [place], [place], or [place] bread. Breads and rolls that contain milk.  Prepared mixes such as muffins, biscuits, waffles, pancakes. Sweet rolls, donuts, [place] toast (if made with milk or lactose).  Crackers: Soda crackers, graham crackers. Any crackers prepared without lactose. Zwieback crackers, corn curls, or any that contain lactose.  Cereals: Cooked or dry cereals prepared without lactose (read labels). Cooked or dry cereals prepared with lactose (read labels). Total, Cocoa Krispies. Special K.  Potatoes / Pasta / Rice: Any prepared without milk or lactose. Popcorn. Instant potatoes, frozen [place] fries, scalloped or au gratin potatoes.  VEGETABLES 2 servings or more Fresh, frozen, and canned vegetables. Creamed or breaded vegetables. Vegetables in a cheese sauce or with lactose-containing margarines.  FRUIT 2 servings or more All fresh, canned, or frozen fruits that are not processed with lactose. Any canned or frozen fruits processed with lactose.  MEAT & SUBSTITUTES 2 servings or more (4 to 6 oz. total per day) Plain beef, chicken, fish, [place], lamb, veal, pork, or ham. Kosher prepared meat products. Strained or junior meats that do not contain milk. Eggs, soy meat substitutes, nuts. Scrambled eggs, omelets, and souffles that contain milk. Creamed or breaded meat, fish, or fowl. Sausage products such as wieners, liver sausage, or cold cuts that contain milk solids. Cheese, cottage cheese, or cheese spreads.  MILK None. (See "BEVERAGES" for milk substitutes. See "DESSERTS" for ice cream and frozen desserts.) Milk (whole, 2%, skim, or chocolate). Evaporated, powdered, or condensed milk; malted milk.  SOUPS & COMBINATION FOODS Bouillon, broth, vegetable soups, clear soups, consomms. Homemade soups made with allowed ingredients. Combination or prepared foods that  do not contain milk or milk products (read labels). Cream soups, chowders, commercially prepared soups containing lactose. Macaroni and cheese, pizza. Combination or prepared foods that contain milk or  milk products.  DESSERTS & SWEETS In moderation Water and fruit ices; gelatin; angel food cake. Homemade cookies, pies, or cakes made from allowed ingredients. Pudding (if made with water or a milk substitute). Lactose-free tofu desserts. Sugar, honey, corn syrup, jam, jelly; marmalade; molasses (beet sugar); Pure sugar candy; marshmallows. Ice cream, ice milk, sherbet, custard, pudding, frozen yogurt. Commercial cake and cookie mixes. Desserts that contain chocolate. Pie crust made with milk-containing margarine; reduced-calorie desserts made with a sugar substitute that contains lactose. Toffee, peppermint, butterscotch, chocolate, caramels.  FATS & OILS In moderation Butter (as tolerated; contains very small amounts of lactose). Margarines and dressings that do not contain milk, Vegetable oils, shortening, Miracle Whip, mayonnaise, nondairy cream & whipped toppings without lactose or milk solids added (examples: Coffee Rich, Carnation Coffeemate, Rich's Whipped Topping, PolyRich). Berniece Salines. Margarines and salad dressings containing milk; cream, cream cheese; peanut butter with added milk solids, sour cream, chip dips, made with sour cream.  BEVERAGES Carbonated drinks; tea; coffee and freeze-dried coffee; some instant coffees (check labels). Fruit drinks; fruit and vegetable juice; Rice or Soy milk. Ovaltine, hot chocolate. Some cocoas; some instant coffees; instant iced teas; powdered fruit drinks (read labels).   CONDIMENTS / MISCELLANEOUS Soy sauce, carob powder, olives, gravy made with water, baker's cocoa, pickles, pure seasonings and spices, wine, pure monosodium glutamate, catsup, mustard. Some chewing gums, chocolate, some cocoas. Certain antibiotics and vitamin / mineral preparations. Spice blends if they contain milk products. MSG extender. Artificial sweeteners that contain lactose such as Equal (Nutra-Sweet) and Sweet 'n Low. Some nondairy creamers (read labels).   SAMPLE  MENU*  Breakfast   Orange Juice.  Banana.   Bran flakes.   Nondairy Creamer.  [place] Bread (toasted).   Butter or milk-free margarine.   Coffee or tea.    Noon Meal   Chicken Breast.  Rice.   Green beans.   Butter or milk-free margarine.  Fresh melon.   Coffee or tea.    Evening Meal   Roast Beef.  Baked potato.   Butter or milk-free margarine.   Broccoli.   Lettuce salad with vinegar and oil dressing.  [employer]   Coffee or tea.

## 2018-04-24 NOTE — Telephone Encounter (Signed)
Pt said he had a hydrogen breath test and the results some have been sent to [place] and he needed to know about being lactose intolerant. Please call him at [phone]

## 2018-04-24 NOTE — Telephone Encounter (Signed)
Pt is aware and I am sending info in the mail and he is also looking up online.

## 2018-04-24 NOTE — Telephone Encounter (Signed)
Pt is aware I have placed the results on Dr. Oneida Alar desk to review.

## 2018-05-01 ENCOUNTER — Telehealth: Payer: Self-pay

## 2018-05-01 NOTE — Telephone Encounter (Signed)
Opened in error

## 2018-05-01 NOTE — Telephone Encounter (Signed)
Pt called asking for a sooner appointment with Dr. Oneida Alar since his test for lactose intolerance came back positive. Pt said he doesn't have the normal side affects from diary that others have. He says his diarrhea doesn't come until after 2 hours of eating dairy. Pt Pt states that he can deterine which dairy product is causing his diarrhea. Pt was offered another appointment with a different provider since Dr. Oneida Alar will not be in the office. Are you ok with pt seeing a different provider?

## 2018-05-01 NOTE — Telephone Encounter (Signed)
Correction, Pt can't determine which dairy product is causing his diarrhea.

## 2018-05-02 NOTE — Telephone Encounter (Signed)
PLEASE CALL PT. AL DAIRY PRODUCTS WILL CAUSE DIARRHEA. IT IS NOT UNUSUAL TO HAVE DIARRHEA UP TO 2-4 HOURS AFTER EATING DIARY. IF HE IS HAVING DIARRHEA, HE SHOULD AVOID DIARY. ASK PT IF HE WANTS TO WAIT TO SEE ME OR SEE AN APP. I DON'T HAVE ANY OFC VISITS UNTIL June.

## 2018-05-05 NOTE — Telephone Encounter (Signed)
PT is aware of Dr. Oneida Alar recommendations and Rosendo Gros has scheduled him an OV  with Randall Hiss tomorrow. Pt is aware.

## 2018-05-06 ENCOUNTER — Ambulatory Visit (INDEPENDENT_AMBULATORY_CARE_PROVIDER_SITE_OTHER): Payer: Medicare HMO | Admitting: Nurse Practitioner

## 2018-05-06 ENCOUNTER — Encounter: Payer: Self-pay | Admitting: Nurse Practitioner

## 2018-05-06 VITALS — BP 151/87 | HR 77 | Temp 98.0°F | Ht 66.0 in | Wt 152.6 lb

## 2018-05-06 DIAGNOSIS — E739 Lactose intolerance, unspecified: Secondary | ICD-10-CM

## 2018-05-06 DIAGNOSIS — R198 Other specified symptoms and signs involving the digestive system and abdomen: Secondary | ICD-10-CM | POA: Diagnosis not present

## 2018-05-06 NOTE — Assessment & Plan Note (Signed)
Not ideally controlled.  Likely multifactorial in nature.  He does have lactose intolerance.  He does have some intermittent stools despite "low lactose dairy products."  I recommended he avoid all dairy products completely for at least 2 weeks, as per above.  We will see how he does with this if he has any improvement.  He is also very anxious person with high levels of stress intermittently.  He is currently going to the process of purchasing land and considering building a home.  He admits he has been under stress related to this.  I recommended he keep a symptom diary and to note when he has his symptoms and to jot down any triggers he can think of such as dietary indiscretions, stress triggers, etc.  Follow-up in 3 months.

## 2018-05-06 NOTE — Progress Notes (Signed)
cc'ed to pcp °

## 2018-05-06 NOTE — Assessment & Plan Note (Addendum)
Confirmed lactose intolerance based on breath testing at [facility].  He states he knew he was lactose intolerance after he left the appointment before he got results because of the significant abdominal discomfort, diarrhea, gas associated with the solution he had to drink.  He seemed to think that certain dairy foods he could get away with eating and others not.  All there are varying amounts of lactose and different types of cheeses he is still having symptoms, as per below.  I recommended he avoid all dairy completely for the next 2 weeks and see how he feels.  He is tried Lactaid without good results.  He can call us if he has questions or concerns.  We spent an extensive amount of time discussing dairy free options for cheeses, desserts, milk products, etc.  Recommend he follow-up in 3 months.  A minimum of 30 minutes was spent with the patient. At least 50% of time was spent on education, care coordination, disease pathology.

## 2018-05-06 NOTE — Patient Instructions (Signed)
1. As we discussed, keep a diary of when you have abdominal symptoms.  Try to jot down any possible triggers, such as "cheating" on your diet, stress triggers, etc. 2. Try to avoid all dairy products for at least 2 weeks to see if this improves your symptoms. 3. There are other options, as we discussed, such as soy, coconut, almond-based dairy products.  Vegan cheeses.  Vegan margarine.  Coconut, almond, soy-based dairy products for dessert. 4. Return for follow-up in 3 months. 5. Call us if you have any questions or concerns  At [facility] we value your feedback. You may receive a survey about your visit today. Please share your experience as we strive to create trusting relationships with our patients to provide genuine, compassionate, quality care.  It was great to see you today!  I hope you have a wonderful summer and good luck with the land purchase!!!

## 2018-05-06 NOTE — Progress Notes (Signed)
Referring Provider: Tamera Punt, DO Primary Care Physician:  Tamera Punt, DO Primary GI:  Dr. Oneida Alar  Chief Complaint  Patient presents with  . Diarrhea    HPI:   Ronnie Patrick is a 67 y.o. male who presents follow-up on diarrhea.  The patient was last seen in our office 02/12/2018 for irregular bowel habits, rectal discharge, hemorrhoids.  His last visit he noted transition from constipation to diarrhea, stop stool softener and true [place].  Decreased fried foods and started using an air prior.  Takes for senna at night.  More gas than he used to have.  Had a problem with dairy when he was younger.  Will discharge noted to be improved.  GERD symptoms resolved.  Regular bowel habits not ideally controlled most likely due to stress and fiber/dairy intake and nightly senna.  Recommended decrease senna to 2 at bedtime, eat fiber, add lactase 3 pills with meals up to 3 times a day if diarrhea with loose stools after dairy.  Follow-up in 6 months.  He was referred to [facility] and seen 04/09/2018.  They recommended breath test for lactose.  Results faxed to our office which indicated lactose intolerance.  Recommended continue Protonix, follow dairy free diet, use 2-3 lactase pills with dairy if he does consume dairy.  Patient felt he was having an conventional symptoms related to lactose intolerance with diarrhea 2 hours after consuming.  It was indicated it is not uncommon to have diarrhea 2 to 4 hours after eating dairy.  Recommend he avoid all dairy.  Today he states he's still having diarrhea. Has tried Lactaid with no help. Lactose free milk has not helped. Does use milk in coffee only, otherwise no milk. Still has issues with cheeses (his research has found aged and/or hard cheeses tend to not cause as severe symptoms.) Has other comorbidities which require specific dietary requirements (DM, hypercholesterolemia, etc).  Admits he has been under intermittent bouts of high stress. Denies chest  pain, dyspnea, dizziness, lightheadedness, syncope, near syncope. Denies any other upper or lower GI symptoms.  Past Medical History:  Diagnosis Date  . Basal cell carcinoma (BCC)   . GERD (gastroesophageal reflux disease)   . HTN (hypertension)   . Hyperlipidemia   . Jock itch    LAMISIL/TERBINAFINE REQUIRED  . Lyme disease JUN 2016    Past Surgical History:  Procedure Laterality Date  . BIOPSY  02/20/2017   Procedure: BIOPSY;  Surgeon: Danie Binder, MD;  [facility];  Service: Endoscopy;;  gastric and esophageal  . COLONOSCOPY  DEC 2015 [facility]   [facility]  . ESOPHAGOGASTRODUODENOSCOPY N/A 02/20/2017   Procedure: ESOPHAGOGASTRODUODENOSCOPY (EGD);  Surgeon: Danie Binder, MD;  [facility];  Service: Endoscopy;  Laterality: N/A;  215  . EXCISIONAL HEMORRHOIDECTOMY     AGE 58-PAINFUL  . FLEXIBLE SIGMOIDOSCOPY N/A 07/15/2015   Procedure: FLEXIBLE SIGMOIDOSCOPY;  Surgeon: Danie Binder, MD;  [facility];  Service: Endoscopy;  Laterality: N/A;  100 - moved to 12:00 - office to notify  . HEMORRHOID BANDING N/A 07/15/2015   Procedure: HEMORRHOID BANDING;  Surgeon: Danie Binder, MD;  [facility];  Service: Endoscopy;  Laterality: N/A;  . TONSILLECTOMY AND ADENOIDECTOMY     AS A CHILD  . UPPER GASTROINTESTINAL ENDOSCOPY  DEC 2015 [facility]   GERD, HH    Current Outpatient Medications  Medication Sig Dispense Refill  . acetaminophen (TYLENOL) 500 MG tablet Take 500 mg by mouth  2 (two) times daily.    Marland Kitchen acyclovir (ZOVIRAX) 400 MG tablet Take 400 mg by mouth 2 (two) times daily.  2  . atorvastatin (LIPITOR) 40 MG tablet Take 40 mg by mouth daily.  4  . Azelastine-Fluticasone 137-50 MCG/ACT SUSP Place 2 sprays into the nose 2 (two) times daily. 23 g 5  . calcium elemental as carbonate (TUMS ULTRA 1000) 400 MG tablet Chew 3,000 mg by mouth as needed for heartburn. Reported on 04/05/2016    . emtricitabine-tenofovir (TRUVADA) 200-300 MG tablet Take 1  tablet by mouth daily with lunch.     . fexofenadine (ALLEGRA) 180 MG tablet Take 180 mg by mouth 2 (two) times daily.     . fluticasone (CUTIVATE) 0.05 % cream APPLY TO AFFECTED AREA BID. takes daily  3  . ibuprofen (ADVIL,MOTRIN) 800 MG tablet Take 800 mg by mouth every 8 (eight) hours as needed.    Marland Kitchen ketoconazole (NIZORAL) 2 % cream Apply 1 application topically daily.     . Multiple Vitamin (MULTIVITAMIN WITH MINERALS) TABS tablet Take 1 tablet by mouth daily at 12 noon.     . pantoprazole (PROTONIX) 40 MG tablet Take 1 tablet (40 mg total) by mouth daily. 90 tablet 3  . Polyethyl Glycol-Propyl Glycol (SYSTANE OP) Apply 1-2 drops to eye daily as needed (dry eyes).    Marland Kitchen senna (SENOKOT) 8.6 MG TABS tablet Take 4 tablets by mouth at bedtime.    . sodium chloride (OCEAN) 0.65 % SOLN nasal spray Place 1 spray into both nostrils as needed for congestion.    . terbinafine (LAMISIL) 1 % cream Apply 1 application topically 2 (two) times daily as needed.    . VENTOLIN HFA 108 (90 BASE) MCG/ACT inhaler Inhale 2 puffs into the lungs every 4 (four) hours as needed for wheezing or shortness of breath.     . sildenafil (REVATIO) 20 MG tablet Take 100 mg by mouth daily as needed (erectile dysfunction).     No current facility-administered medications for this visit.     Allergies as of 05/06/2018 - Review Complete 05/06/2018  Allergen Reaction Noted  . Bacitracin Hives 02/28/2017  . Flagyl [metronidazole] Itching 06/29/2015  . Neosporin [neomycin-bacitracin zn-polymyx] Hives 02/28/2017  . Penicillins Itching 06/29/2015    Family History  Problem Relation Age of Onset  . Allergic rhinitis Mother   . Eczema Mother   . Urticaria Mother   . Asthma Father   . Colon cancer Neg Hx   . Colon polyps Neg Hx     Social History   Socioeconomic History  . Marital status: Divorced    Spouse name: Not on file  . Number of children: Not on file  . Years of education: Not on file  . Highest education  level: Not on file  Occupational History  . Not on file  Social Needs  . Financial resource strain: Not on file  . Food insecurity:    Worry: Not on file    Inability: Not on file  . Transportation needs:    Medical: Not on file    Non-medical: Not on file  Tobacco Use  . Smoking status: Former Smoker    Packs/day: 1.00    Years: 34.00    Pack years: 34.00    Last attempt to quit: 07/14/2001    Years since quitting: 16.8  . Smokeless tobacco: Never Used  . Tobacco comment: Quit in 2002  Substance and Sexual Activity  . Alcohol use: No  Alcohol/week: 0.0 oz  . Drug use: No  . Sexual activity: Not on file  Lifestyle  . Physical activity:    Days per week: Not on file    Minutes per session: Not on file  . Stress: Not on file  Relationships  . Social connections:    Talks on phone: Not on file    Gets together: Not on file    Attends religious service: Not on file    Active member of club or organization: Not on file    Attends meetings of clubs or organizations: Not on file    Relationship status: Not on file  Other Topics Concern  . Not on file  Social History Narrative   ORIGINALLY FROM [place]. USED TO LOVE TO VACATION IN [place]: [place], [place].    JOB: RESEARCH TECHNICIAN, DEGREE IN COMPUTER SCIENCE, SOLD INSURANCE.      2 SONS: AGER 43, 37 AND THEY LIVE IN [place] WITH EX-WIFE.      RARE ETOH OR SWEETS. HAS A TWIN BROTHER WITH DIABETES.    Review of Systems: Complete ROS negative except as per HPI.   Physical Exam: BP (!) 151/87   Pulse 77   Temp 98 F (36.7 C) (Oral)   Ht 5\' 6"  (1.676 m)   Wt 152 lb 9.6 oz (69.2 kg)   BMI 24.63 kg/m  General:   Alert and oriented. Pleasant and cooperative. Well-nourished and well-developed.  Eyes:  Without icterus, sclera clear and conjunctiva pink.  Ears:  Normal auditory acuity. Cardiovascular:  S1, S2 present without murmurs appreciated. Extremities without clubbing or edema. Respiratory:  Clear to auscultation  bilaterally. No wheezes, rales, or rhonchi. No distress.  Gastrointestinal:  +BS, soft, non-tender and non-distended. No HSM noted. No guarding or rebound. No masses appreciated.  Rectal:  Deferred  Musculoskalatal:  Symmetrical without gross deformities. Neurologic:  Alert and oriented x4;  grossly normal neurologically. Psych:  Seems high anxiety.    05/06/2018 11:23 AM   Disclaimer: This note was dictated with voice recognition software. Similar sounding words can inadvertently be transcribed and may not be corrected upon review.

## 2018-06-02 ENCOUNTER — Telehealth: Payer: Self-pay | Admitting: Gastroenterology

## 2018-06-02 NOTE — Telephone Encounter (Signed)
Forwarding to [place] to f/u on.

## 2018-06-02 NOTE — Telephone Encounter (Signed)
I spoke with the patient and he would like a copy of his HBT mailed to him.  I placed a copy in the mail to him.

## 2018-06-02 NOTE — Telephone Encounter (Signed)
Pt called asking for DS. I told him DS was off today and she would be back in the morning. Pt has questions about his hydrogen breath test and labs for being lactose intolerant and why he couldn't see those results on Mlychart. I tried to explain to him that if the test were done outside of [facility] it wouldn't be on Mychart and if SF wanted anything like that scanned into his chart I would've sent it to scanning. Pt is requesting copies of those results and would like for DS to call him at [phone]

## 2018-06-05 ENCOUNTER — Telehealth: Payer: Self-pay

## 2018-06-05 NOTE — Telephone Encounter (Signed)
Pt is aware and he will call if he has questions.

## 2018-06-05 NOTE — Telephone Encounter (Signed)
Pt called- he is upset and frustrated that Tamela Oddi has not called him back when he called on Monday and was told she was out and would call him on Tuesday. I explained to him that Rosendo Gros had spoken to him on Monday and from reading the note in his chart, it looks like his needs were taken care of and that is probably why she didn't return his call. He is questioning if we still want him as a patient. I assured him that we do.   He said he has been completely dairy-free for 2 weeks like Randall Hiss asked him to be and he is only about 50% better than he was. He is now having 2-3 BM's daily that usually start off normal but turn soft in the middle of the BM. Or he will have a completely soft BM. He has figured out that cows milk is his biggest problem and he has switched to Rozel coconut milk (which is the only one he can tolerate the taste of) and he has called [employer] about the cost of it and they have sent him some coupons.  He loves cheese, esp swiss and parmesan and he completely avoided them for 2 weeks and has added them back a little and has only noticed slightly softer BM's. He said the vegan butter he has had to switch to was $7.98 for a 2lb container and he is happy with the product but not the price. He said he has so many other dietary restrictions (with his other medical conditions) that it makes it hard and expensive to buy things that are also lactose free.   He got online and was researching lactose intolerance- he found out there are many products that contain hidden lactose, such as his breadcrumbs that he buys from [employer] has hidden lactose in it. He has placed a call to [facility] and is waiting for a return call from them. He also has called all the drug manufactures of his medications and has found that his truvada and atorvastatin have a small amount of lactose but he doesn't think it is enough lactose to worry about changing medications.   He said he faithfully took lactaid for awhile and it didn't help  at all. He said he read that lactaid doesn't work for some patients and he is one of those patients.   Pt stated he needed some guidance and he would appreciate any input from [facility] about this and he is expecting a call back from either me or Doris this afternoon. His phone number is [phone].

## 2018-06-05 NOTE — Telephone Encounter (Signed)
PLEASE CALL PT. HE HAS LACTOSE INTOLERANCE BY TESTING FROM [facility]. WE CAN SEND HIM A HANDOUT ON HOW TO IDENTIFYHIDDENLACTOSE IN HIS DIET. 2-3 BMs A DAY THAT ARE NORMAL OR SOFT ARE OK.HE CAN ADD BACK CHEESE IF HE LIKES BUT IT MAY CA OR MAY NOT CAUSE HIM TO HAVE LOOSE STOOLS OR DIARRHEA DEPENDING ON HOW MUCH LACTOSE IS IN THE CHEESE.  THIS IS A MOST INCONVENIENT DIAGNOSIS BUT IT IS NOT LIFE THREATENING. IT IS MANAGED WITH DIET AND AVOIDING DAIRY. LACTASE PILLS DON'T WORK FOR HIM SO THAT IS NO LONGER AN OPTION.

## 2018-06-10 ENCOUNTER — Telehealth: Payer: Self-pay | Admitting: Gastroenterology

## 2018-06-10 NOTE — Telephone Encounter (Signed)
[phone]  Please call patient, he has some questions

## 2018-06-10 NOTE — Telephone Encounter (Signed)
I addressed the patient's concern about his HBT results being delayed through the mail.   Patient voiced understanding and the call was disconnected.

## 2018-06-10 NOTE — Telephone Encounter (Signed)
I called pt and the first thing he said after hello was" I don't know why they told you to call me". Michela Pitcher this has absolutely nothing to do with you. He was questioning why he was sent 4 pages of results with no explanation of why they were sent. I told him I was not aware of that but I would try to find out who sent it. He said the Breath Test results that [facility] sent him was printed on a Monday, postage was dated on Wed and he did not receive til Sat. He said that is just crazy. I don't know why she sent those when it took so long, I had already pulled up on [place] before I received the mail from her.  I just told him that Rosendo Gros is out this afternoon, but I will let her know. He said will someone call me back today. I told him I would relay the message and I was not sure when he would receive a call back. He said "OK".

## 2018-06-12 NOTE — Telephone Encounter (Signed)
I received a voicemail from the patient this morning.  I returned to the call this morning at 10:52 am and he wanted to know why he never received a call from [place] from the 6/17 phone note.  I explained to him that I spoke with him on 6/17 at 4:15 pm and he stated he wanted to a copy of his HBT mailed to him.  I mailed a copy of the test that day.  He went on to say that everything has been addressed and the call was disconnected.

## 2018-08-13 ENCOUNTER — Ambulatory Visit: Payer: Medicare HMO | Admitting: Gastroenterology

## 2018-08-13 ENCOUNTER — Encounter: Payer: Self-pay | Admitting: Gastroenterology

## 2018-08-13 DIAGNOSIS — R198 Other specified symptoms and signs involving the digestive system and abdomen: Secondary | ICD-10-CM

## 2018-08-13 DIAGNOSIS — E739 Lactose intolerance, unspecified: Secondary | ICD-10-CM | POA: Diagnosis not present

## 2018-08-13 DIAGNOSIS — R1310 Dysphagia, unspecified: Secondary | ICD-10-CM

## 2018-08-13 DIAGNOSIS — K219 Gastro-esophageal reflux disease without esophagitis: Secondary | ICD-10-CM

## 2018-08-13 DIAGNOSIS — R1319 Other dysphagia: Secondary | ICD-10-CM

## 2018-08-13 NOTE — Assessment & Plan Note (Signed)
SYMPTOMS CONTROLLED/RESOLVED.  CONTINUE TO MONITOR SYMPTOMS. 

## 2018-08-13 NOTE — Assessment & Plan Note (Signed)
SYMPTOMS NOT IDEALLY CONTROLLED ON SENNA BID AND LACTOSE FREE DIET.  TITRATE SENNA TO REDUCE LOOSE STOOLS. TRY TO GO DOWN TO ONCE A DAY. CONTINUE LACTOSE FREE DIET. PLEASE CALL IN ONE MONTH IF SYMPTOMS ARE NOT IMPROVED. WE CAN CONSIDER HIGH DOSE PEPTO BISMOL OR A COLONOSCOPY. FOLLOW UP IN 6 MOS.

## 2018-08-13 NOTE — Patient Instructions (Addendum)
TITRATE SENNA TO REDUCE LOOSE STOOLS. TRY TO GO DOWN TO ONCE A DAY.  CONTINUE LACTOSE FREE DIET.  PLEASE CALL IN ONE MONTH IF SYMPTOMS ARE NOT IMPROVED. WE CAN CONSIDER HIGH DOSE PEPTO BISMOL OR A COLONOSCOPY.  FOLLOW UP IN 6 MOS.

## 2018-08-13 NOTE — Progress Notes (Signed)
Subjective:    Patient ID: Ronnie Patrick, male    DOB: 1951-12-11, 67 y.o.   MRN: [id number]  Ronnie Punt, DO  HPI Now UNDERSTANDS HE HAS LACTOSE INTOLERANCE AND EMBRACED THE DIAGNOSIS. NOT TRYING TO AVOID DAIRY. ALSO  WENT COMPLETELY DAIRY FREE. MAY 26 HAD IT WITH HIS DIARRHEA. WENT WHAT HE THOUGHT WAS DAIRY FREE AND STILL HAVING PROBLEMS. FOUND OUT HAD NONFAT MILK IN HIS BREAD CRUMBS. SOME MEDS HAVE LACTOSE IN THEM(FOUR AND NOW DOWN TO TWO). LASTS SEVERAL WEEKS WENT TO A DIETITIAN AND SEES HER ONCE A WEEK. MOVING TO [place], [place] IN SEP/OCT. HAD TO GET RID OF DARK CHOCOLATE ALMONDS. 50% SYMPTOMS ARE REDUCES. DOWN TO SENNA 2 TABLETS A NIGHT. DROPPED IT TO ONE AND HARDLY GOES AND IT'S SOFT BUT DOESN'T GO(CONSTIPATION). GROCERY BILL WENT UP AND NOW IT'S COMING DOWN. LACTASE PILLS DID NOT HELP AND LACTAID MILK DID NOT HELP. EATS A LOT OF FIBER: SOLUBLE AND INSOLUBLE. HAD SEVERE ABDOMINAL PAIN AFTER LACTOSE INTOLERANCE  TEST. REPLACING RICOTTA WITH TOFU. BMS: 2-3/DAY, NO NOCTURNAL STOOLS.   PT DENIES FEVER, CHILLS, HEMATOCHEZIA, HEMATEMESIS, nausea, vomiting, melena, CHEST PAIN, SHORTNESS OF BREATH, CHANGE IN BOWEL IN HABITS, problems swallowing, OR heartburn or indigestion.  Past Medical History:  Diagnosis Date  . Basal cell carcinoma (BCC)   . GERD (gastroesophageal reflux disease)   . HTN (hypertension)   . Hyperlipidemia   . Jock itch    LAMISIL/TERBINAFINE REQUIRED  . Lyme disease JUN 2016   Past Surgical History:  Procedure Laterality Date  . BIOPSY  02/20/2017   Procedure: BIOPSY;  Surgeon: Ronnie Binder, MD;  [facility];  Service: Endoscopy;;  gastric and esophageal  . COLONOSCOPY  DEC 2015 Ronnie Patrick   [facility]  . ESOPHAGOGASTRODUODENOSCOPY N/A 02/20/2017   Procedure: ESOPHAGOGASTRODUODENOSCOPY (EGD);  Surgeon: Ronnie Binder, MD;  [facility];  Service: Endoscopy;  Laterality: N/A;  215  . EXCISIONAL HEMORRHOIDECTOMY     AGE 71-PAINFUL  . FLEXIBLE SIGMOIDOSCOPY  N/A 07/15/2015   Procedure: FLEXIBLE SIGMOIDOSCOPY;  Surgeon: Ronnie Binder, MD;  [facility];  Service: Endoscopy;  Laterality: N/A;  100 - moved to 12:00 - office to notify  . HEMORRHOID BANDING N/A 07/15/2015   Procedure: HEMORRHOID BANDING;  Surgeon: Ronnie Binder, MD;  [facility];  Service: Endoscopy;  Laterality: N/A;  . TONSILLECTOMY AND ADENOIDECTOMY     AS A CHILD  . UPPER GASTROINTESTINAL ENDOSCOPY  DEC 2015 Ronnie Patrick   GERD, HH   Allergies  Allergen Reactions  . Bacitracin Hives  . Flagyl [Metronidazole] Itching  . Neosporin [Neomycin-Bacitracin Zn-Polymyx] Hives  . Penicillins Itching       Current Outpatient Medications  Medication Sig    . acetaminophen (TYLENOL) 500 MG tablet Take 500 mg by mouth 2 (two) times daily.    Marland Kitchen acyclovir (ZOVIRAX) 400 MG tablet Take 400 mg by mouth 2 (two) times daily.    Marland Kitchen atorvastatin (LIPITOR) 40 MG tablet Take 40 mg by mouth daily.    Marland Kitchen azelastine (ASTELIN) 0.1 % nasal spray Place 1 spray into both nostrils 2 (two) times daily. Use in each nostril as directed    . calcium elemental as carbonate (TUMS ULTRA 1000) 400 MG tablet Chew 3,000 mg by mouth as needed for heartburn. Reported on 04/05/2016    . emtricitabine-tenofovir (TRUVADA) 200-300 MG tablet Take 1 tablet by mouth daily with lunch.     . fexofenadine (ALLEGRA) 180 MG tablet Take  180 mg by mouth 2 (two) times daily.     . fluticasone (CUTIVATE) 0.05 % cream APPLY TO AFFECTED AREA BID. takes daily    . fluticasone (FLONASE) 50 MCG/ACT nasal spray Place 1 spray into both nostrils 2 (two) times daily.    Marland Kitchen ketoconazole (NIZORAL) 2 % cream Apply 1 application topically daily.     Marland Kitchen lidocaine (XYLOCAINE) 2 % solution as needed for mouth pain.    . Multiple Vitamin (MULTIVITAMIN WITH MINERALS) TABS tablet Take 1 tablet by mouth daily at 12 noon.     . pantoprazole (PROTONIX) 40 MG tablet Take 1 tablet (40 mg total) by mouth daily.    [name] Glycol-Propyl  Glycol (SYSTANE OP) Apply 1-2 drops to eye daily as needed (dry eyes).    Marland Kitchen senna (SENOKOT) 8.6 MG TABS tablet Take 1-2 tablets by mouth at bedtime.     . sildenafil (REVATIO) 20 MG tablet Take 100 mg by mouth daily as needed (erectile dysfunction).    . sodium chloride (OCEAN) 0.65 % SOLN nasal spray Place 1 spray into both nostrils as needed for congestion.    . terbinafine (LAMISIL) 1 % cream Apply 1 application topically 2 (two) times daily as needed.    . VENTOLIN HFA 108 (90 BASE) MCG/ACT inhaler Inhale 2 puffs into the lungs every 4 (four) hours as needed for wheezing or shortness of breath.     .      . ibuprofen (ADVIL,MOTRIN) 800 MG tablet Take 800 mg by mouth every 8 (eight) hours as needed.     Review of Systems PER HPI OTHERWISE ALL SYSTEMS ARE NEGATIVE.    Objective:   Physical Exam  Constitutional: He is oriented to person, place, and time. He appears well-developed and well-nourished. No distress.  HENT:  Head: Normocephalic and atraumatic.  Mouth/Throat: Oropharynx is clear and moist. No oropharyngeal exudate.  Eyes: Pupils are equal, round, and reactive to light. No scleral icterus.  Neck: Normal range of motion. Neck supple.  Cardiovascular: Normal rate, regular rhythm and normal heart sounds.  Pulmonary/Chest: Effort normal and breath sounds normal. No respiratory distress.  Abdominal: Soft. Bowel sounds are normal. He exhibits no distension. There is no tenderness.  Musculoskeletal: He exhibits no edema.  Lymphadenopathy:    He has no cervical adenopathy.  Neurological: He is alert and oriented to person, place, and time.  NO  NEW FOCAL DEFICITS  Psychiatric:  ANXIOUS MOOD, NL AFFECT  Vitals reviewed.     Assessment & Plan:

## 2018-08-20 ENCOUNTER — Telehealth: Payer: Self-pay | Admitting: Gastroenterology

## 2018-08-20 DIAGNOSIS — R197 Diarrhea, unspecified: Secondary | ICD-10-CM

## 2018-08-20 NOTE — Telephone Encounter (Signed)
Pt is aware and requested the order be mailed to him to do in [place].  I am putting in the mail now.

## 2018-08-20 NOTE — Telephone Encounter (Signed)
Pt is having diarrhea. Needs Giardia Ag. Please contact pt to pick up sample container and with instruction for collections.

## 2018-09-03 ENCOUNTER — Other Ambulatory Visit: Payer: Self-pay

## 2018-09-03 ENCOUNTER — Other Ambulatory Visit: Payer: Self-pay | Admitting: Gastroenterology

## 2018-09-03 DIAGNOSIS — R197 Diarrhea, unspecified: Secondary | ICD-10-CM

## 2018-09-04 NOTE — Telephone Encounter (Signed)
CC'D TO PCP °

## 2018-09-08 LAB — GIARDIA/CRYPTOSPORIDIUM (EIA)
MICRO NUMBER:: 91133691
MICRO NUMBER:: 91133692
RESULT:: NOT DETECTED
RESULT:: NOT DETECTED
SPECIMEN QUALITY:: ADEQUATE
SPECIMEN QUALITY:: ADEQUATE

## 2018-09-08 LAB — TIQ-NTM

## 2018-09-09 NOTE — Telephone Encounter (Signed)
CC'D TO PCP °

## 2018-10-01 ENCOUNTER — Encounter: Payer: Self-pay | Admitting: Gastroenterology

## 2018-12-16 ENCOUNTER — Other Ambulatory Visit: Payer: Self-pay | Admitting: Nurse Practitioner

## 2018-12-16 DIAGNOSIS — K219 Gastro-esophageal reflux disease without esophagitis: Secondary | ICD-10-CM

## 2019-02-16 ENCOUNTER — Ambulatory Visit: Payer: Medicare HMO | Admitting: Gastroenterology

## 2019-02-16 ENCOUNTER — Telehealth: Payer: Self-pay

## 2019-02-16 ENCOUNTER — Other Ambulatory Visit: Payer: Self-pay

## 2019-02-16 ENCOUNTER — Encounter: Payer: Self-pay | Admitting: Gastroenterology

## 2019-02-16 VITALS — BP 180/92 | HR 79 | Temp 97.9°F | Ht 66.0 in | Wt 149.4 lb

## 2019-02-16 DIAGNOSIS — R198 Other specified symptoms and signs involving the digestive system and abdomen: Secondary | ICD-10-CM | POA: Diagnosis not present

## 2019-02-16 DIAGNOSIS — K529 Noninfective gastroenteritis and colitis, unspecified: Secondary | ICD-10-CM | POA: Insufficient documentation

## 2019-02-16 DIAGNOSIS — E739 Lactose intolerance, unspecified: Secondary | ICD-10-CM

## 2019-02-16 MED ORDER — CLENPIQ 10-3.5-12 MG-GM -GM/160ML PO SOLN: 1.0000 | Freq: Once | ORAL | 0 refills | Status: AC

## 2019-02-16 NOTE — Assessment & Plan Note (Signed)
Pleasant 68 year old gentleman presenting for follow-up of chronic diarrhea, lactose intolerance.  Patient has significant change in bowel habits approximately a year ago going from having constipation to now having mostly diarrhea.  Breath test confirmed lactose intolerance.  Intermittently has used senna because of the sensation of incomplete evacuation, not due to hard stool.  With dairy avoidance, he denies significant improvement in his symptoms.  He continued to have diarrhea with dairy avoidance, when he weaned off of senna although he did note decrease stool frequency.  Patient is requesting colonoscopy due to change in bowel habits, to rule out underlying etiologies such as inflammatory bowel disease, microscopic colitis.  He reports "oversedation" with conscious sedation due to prolonged "hangover effect".  Offered patient colonoscopy with deep sedation with assistance of anesthesiology.  I have discussed the risks, alternatives, benefits with regards to but not limited to the risk of reaction to medication, bleeding, infection, perforation and the patient is agreeable to proceed. Written consent to be obtained.

## 2019-02-16 NOTE — Patient Instructions (Signed)
1. Colonoscopy as scheduled. See separate instructions.  

## 2019-02-16 NOTE — Progress Notes (Signed)
CC'D TO PCP °

## 2019-02-16 NOTE — Telephone Encounter (Signed)
Called to inform pt of pre-op appt 04/06/19 at 10:00am. He had already seen appt on MyChart. Letter mailed.

## 2019-02-16 NOTE — Progress Notes (Signed)
Primary Care Physician: Ronnie Punt, DO  Primary Gastroenterologist:  Ronnie Drain, MD   Chief Complaint  Patient presents with  . Diarrhea    2-3 times per day, no blood in stool  . Abdominal Pain    comes/goes, middle abd  . Nausea    but no vomiting    HPI: Ronnie Patrick is a 68 y.o. male here for follow-up of chronic diarrhea.  He was last seen in August 2019 by Ronnie Patrick.  History of lactose intolerance, confirmed by breath test at [facility].  Celiac serologies previously negative.  Stool for Giardia/Cryptosporidium were negative.  Last colonoscopy in December 2015 by Ronnie Patrick, internal hemorrhoids.  Flexible sigmoidoscopy in July 2016 showed normal sigmoid colon/hemorrhoids.    No longer on Truvada for preexposure prophylaxis for HIV infection.  Reports history of multiple STDs and wonders if this has contributed to his GI issues.  Since last office visit he was checked for Giardia and Cryptosporidium which were negative.  He weaned his senna down to 1 tablet/day to reduce loose stools and continue dairy free diet.  He reported that his symptoms were not improved after 1 month of doing this so he further reduce senna and completely stopped taking it.  After stopping senna for 2-1/2 weeks he noted ongoing loose stools.  But eventually he did note decrease amount of stool, worsening bloating and felt like he had incomplete evacuation.  He therefore went back to 1 senna per day.  States he has been on dairy free diet for 6 months with little improvement of his symptoms.  During this whole time he is never went more than a day without a bowel movement.  Notably prior to early  2019 he was having significant constipation requiring Amitiza and Trulance.  Now he has mostly diarrhea.  Because of lack of improvement in symptoms he decided to add back dairy 1 at a time to see what he was able to tolerate.  Initially added back, Ronnie Patrick which he states he had no problems whatsoever.  Next he  added mozzarella in his lungs he ate small portions he seemed to tolerate this just fine.  Ranch dressing causes significant diarrhea.  Still avoids cows milk.  He has added back real dairy, chatter, cream cheese, Swiss and instead to having diarrhea he feels "bound up".  Uses senna for this.  He denies blood in the stool or melena.  He continues to have crampy abdominal pain prior to bowel movements.  He wonders if pantoprazole may be contributing to his diarrhea but he is scared to make any changes because it does so well to control his upper GI symptoms.  No dysphagia.  Weight essentially stable over the past 1 year.      Current Outpatient Medications  Medication Sig Dispense Refill  . acetaminophen (TYLENOL) 500 MG tablet Take 500 mg by mouth 2 (two) times daily.    Marland Kitchen acyclovir (ZOVIRAX) 400 MG tablet Take 400 mg by mouth 2 (two) times daily.  2  . atorvastatin (LIPITOR) 40 MG tablet Take 40 mg by mouth daily.  4  . azelastine (ASTELIN) 0.1 % nasal spray Place 1 spray into both nostrils 2 (two) times daily. Use in each nostril as directed    . calcium elemental as carbonate (TUMS ULTRA 1000) 400 MG tablet Chew 3,000 mg by mouth as needed for heartburn. Reported on 04/05/2016    . fexofenadine (ALLEGRA) 180 MG tablet Take 180 mg by mouth  daily.     . fluticasone (CUTIVATE) 0.05 % cream APPLY TO AFFECTED AREA BID. takes daily  3  . fluticasone (FLONASE) 50 MCG/ACT nasal spray Place 1 spray into both nostrils 2 (two) times daily.    Marland Kitchen ibuprofen (ADVIL,MOTRIN) 800 MG tablet Take 800 mg by mouth every 8 (eight) hours as needed.    Marland Kitchen ketoconazole (NIZORAL) 2 % cream Apply 1 application topically daily.     Marland Kitchen lidocaine (XYLOCAINE) 2 % solution as needed (gerd).     . Multiple Vitamin (MULTIVITAMIN WITH MINERALS) TABS tablet Take 1 tablet by mouth daily at 12 noon.     . pantoprazole (PROTONIX) 40 MG tablet Take 1 tablet (40 mg total) by mouth daily before breakfast. 90 tablet 3  . Polyethyl  Glycol-Propyl Glycol (SYSTANE OP) Apply 1-2 drops to eye daily as needed (dry eyes).    Marland Kitchen senna (SENOKOT) 8.6 MG TABS tablet Take 2-3 tablets by mouth at bedtime.     . sildenafil (REVATIO) 20 MG tablet Take 100 mg by mouth daily as needed (erectile dysfunction).    . sodium chloride (OCEAN) 0.65 % SOLN nasal spray Place 1 spray into both nostrils as needed for congestion.    . terbinafine (LAMISIL) 1 % cream Apply 1 application topically 2 (two) times daily as needed.    . VENTOLIN HFA 108 (90 BASE) MCG/ACT inhaler Inhale 2 puffs into the lungs every 4 (four) hours as needed for wheezing or shortness of breath.      No current facility-administered medications for this visit.     Allergies as of 02/16/2019 - Review Complete 02/16/2019  Allergen Reaction Noted  . Bacitracin Hives 02/28/2017  . Flagyl [metronidazole] Itching 06/29/2015  . Neosporin [neomycin-bacitracin zn-polymyx] Hives 02/28/2017  . Penicillins Itching 06/29/2015    Past Medical History:  Diagnosis Date  . Basal cell carcinoma (BCC)   . GERD (gastroesophageal reflux disease)   . HTN (hypertension)   . Hyperlipidemia   . Jock itch    LAMISIL/TERBINAFINE REQUIRED  . Lyme disease JUN 2016   Past Surgical History:  Procedure Laterality Date  . BIOPSY  02/20/2017   Procedure: BIOPSY;  Surgeon: Danie Binder, MD;  [facility];  Service: Endoscopy;;  gastric and esophageal  . COLONOSCOPY  DEC 2015 [facility]   [facility]  . ESOPHAGOGASTRODUODENOSCOPY N/A 02/20/2017   Procedure: ESOPHAGOGASTRODUODENOSCOPY (EGD);  Surgeon: Danie Binder, MD;  [facility];  Service: Endoscopy;  Laterality: N/A;  215  . EXCISIONAL HEMORRHOIDECTOMY     AGE 32-PAINFUL  . FLEXIBLE SIGMOIDOSCOPY N/A 07/15/2015   Procedure: FLEXIBLE SIGMOIDOSCOPY;  Surgeon: Danie Binder, MD;  [facility];  Service: Endoscopy;  Laterality: N/A;  100 - moved to 12:00 - office to notify  . HEMORRHOID BANDING N/A 07/15/2015   Procedure:  HEMORRHOID BANDING;  Surgeon: Danie Binder, MD;  [facility];  Service: Endoscopy;  Laterality: N/A;  . TONSILLECTOMY AND ADENOIDECTOMY     AS A CHILD  . UPPER GASTROINTESTINAL ENDOSCOPY  DEC 2015 [facility]   GERD, [facility]   Family History  Problem Relation Age of Onset  . Allergic rhinitis Mother   . Eczema Mother   . Urticaria Mother   . Asthma Father   . Colon cancer Neg Hx   . Colon polyps Neg Hx    Social History   Socioeconomic History  . Marital status: Divorced    Spouse name: Not on file  . Number of  children: Not on file  . Years of education: Not on file  . Highest education level: Not on file  Occupational History  . Not on file  Social Needs  . Financial resource strain: Not on file  . Food insecurity:    Worry: Not on file    Inability: Not on file  . Transportation needs:    Medical: Not on file    Non-medical: Not on file  Tobacco Use  . Smoking status: Former Smoker    Packs/day: 1.00    Years: 34.00    Pack years: 34.00    Last attempt to quit: 07/14/2001    Years since quitting: 17.6  . Smokeless tobacco: Never Used  . Tobacco comment: Quit in 2002  Substance and Sexual Activity  . Alcohol use: No    Alcohol/week: 0.0 standard drinks  . Drug use: No  . Sexual activity: Not on file  Lifestyle  . Physical activity:    Days per week: Not on file    Minutes per session: Not on file  . Stress: Not on file  Relationships  . Social connections:    Talks on phone: Not on file    Gets together: Not on file    Attends religious service: Not on file    Active member of club or organization: Not on file    Attends meetings of clubs or organizations: Not on file    Relationship status: Not on file  Other Topics Concern  . Not on file  Social History Narrative   ORIGINALLY FROM [place]. USED TO LOVE TO VACATION IN [place]: [place], [place].    JOB: RESEARCH TECHNICIAN, DEGREE IN COMPUTER SCIENCE, SOLD INSURANCE.      2 SONS: AGER 43, 37 AND  THEY LIVE IN [place] WITH EX-WIFE.      RARE ETOH OR SWEETS. HAS A TWIN BROTHER WITH DIABETES.     ROS:  General: Negative for anorexia, weight loss, fever, chills, fatigue, weakness. ENT: Negative for hoarseness, difficulty swallowing , nasal congestion. CV: Negative for chest pain, angina, palpitations, dyspnea on exertion, peripheral edema.  Respiratory: Negative for dyspnea at rest, dyspnea on exertion, cough, sputum, wheezing.  GI: See history of present illness. GU:  Negative for dysuria, hematuria, urinary incontinence, urinary frequency, nocturnal urination.  Endo: Negative for unusual weight change.    Physical Examination:   BP (!) 177/97   Pulse 79   Temp 97.9 F (36.6 C) (Oral)   Ht 5\' 6"  (1.676 m)   Wt 149 lb 6.4 oz (67.8 kg)   BMI 24.11 kg/m   General: Well-nourished, well-developed in no acute distress.  Eyes: No icterus. Mouth: Oropharyngeal mucosa moist and pink , no lesions erythema or exudate. Lungs: Clear to auscultation bilaterally.  Heart: Regular rate and rhythm, no murmurs rubs or gallops.  Abdomen: Bowel sounds are normal, nontender, nondistended, no hepatosplenomegaly or masses, no abdominal bruits or hernia , no rebound or guarding.   Extremities: No lower extremity edema. No clubbing or deformities. Neuro: Alert and oriented x 4   Skin: Warm and dry, no jaundice.   Psych: Alert and cooperative, normal mood and affect.   Imaging Studies: No results found.

## 2019-03-18 ENCOUNTER — Telehealth: Payer: Self-pay | Admitting: Gastroenterology

## 2019-03-18 NOTE — Telephone Encounter (Signed)
Pt is scheduled procedure with [facility] on 4/27 and wanted to know if he is still scheduled due to covid 19. He asked for MB to call him back. [phone]

## 2019-03-18 NOTE — Telephone Encounter (Addendum)
Called pt, he will be notified if TCS needs to be rescheduled as we are reviewing cases the week prior at this time. He will call office if he decides to reschedule.

## 2019-04-01 ENCOUNTER — Telehealth: Payer: Self-pay

## 2019-04-01 ENCOUNTER — Other Ambulatory Visit: Payer: Self-pay

## 2019-04-01 MED ORDER — CLENPIQ 10-3.5-12 MG-GM -GM/160ML PO SOLN: 1.0000 | Freq: Once | ORAL | 0 refills | Status: AC

## 2019-04-01 NOTE — Telephone Encounter (Signed)
Pt called office and requested Clenpiq rx be sent to [facility] in [place] on [address]. Rx sent.

## 2019-04-03 ENCOUNTER — Telehealth: Payer: Self-pay | Admitting: *Deleted

## 2019-04-03 NOTE — Telephone Encounter (Signed)
Carolyn in endo called stating they needed to move patient pre-op appt to later in the week. Pre-op now scheduled for 4/23 at 10am. Called patient and LMOVM to call back

## 2019-04-03 NOTE — Telephone Encounter (Signed)
Patient called back and is aware. He states he saw the appt on mychart

## 2019-04-06 ENCOUNTER — Encounter (HOSPITAL_COMMUNITY): Admission: RE | Admit: 2019-04-06 | Payer: Medicare HMO | Source: Ambulatory Visit

## 2019-04-07 ENCOUNTER — Telehealth: Payer: Self-pay

## 2019-04-07 NOTE — Telephone Encounter (Signed)
Pt called office and LMOVM, he has questions about TCS prep instructions. Called pt, questions answered.

## 2019-04-08 ENCOUNTER — Telehealth: Payer: Self-pay | Admitting: Gastroenterology

## 2019-04-08 ENCOUNTER — Encounter: Payer: Self-pay | Admitting: Gastroenterology

## 2019-04-08 NOTE — Patient Instructions (Signed)
Ronnie Patrick  04/08/2019     @PREFPERIOPPHARMACY @   Your procedure is scheduled on  04/13/2019.  Report to [facility] at  700  A.M.  Call this number if you have problems the morning of surgery:  [phone]   Remember:  Follow the diet and prep instructions given to you by Dr Nona Dell office.                       Take these medicines the morning of surgery with A SIP OF WATER  Acyclovir, allegra, metoprolol, protonix. Use your inhaler before you come.    Do not wear jewelry, make-up or nail polish.  Do not wear lotions, powders, or perfumes, or deodorant.  Do not shave 48 hours prior to surgery.  Men may shave face and neck.  Do not bring valuables to the hospital.  [facility] is not responsible for any belongings or valuables.  Contacts, dentures or bridgework may not be worn into surgery.  Leave your suitcase in the car.  After surgery it may be brought to your room.  For patients admitted to the hospital, discharge time will be determined by your treatment team.  Patients discharged the day of surgery will not be allowed to drive home.   Name and phone number of your driver:   family Special instructions:  None  Please read over the following fact sheets that you were given. Anesthesia Post-op Instructions and Care and Recovery After Surgery      Colonoscopy, Adult, Care After This sheet gives you information about how to care for yourself after your procedure. Your health care provider may also give you more specific instructions. If you have problems or questions, contact your health care provider. What can I expect after the procedure? After the procedure, it is common to have:  A small amount of blood in your stool for 24 hours after the procedure.  Some gas.  Mild abdominal cramping or bloating. Follow these instructions at home: General instructions  For the first 24 hours after the procedure: ? Do not drive or use machinery. ? Do not sign  important documents. ? Do not drink alcohol. ? Do your regular daily activities at a slower pace than normal. ? Eat soft, easy-to-digest foods.  Take over-the-counter or prescription medicines only as told by your health care provider. Relieving cramping and bloating   Try walking around when you have cramps or feel bloated.  Apply heat to your abdomen as told by your health care provider. Use a heat source that your health care provider recommends, such as a moist heat pack or a heating pad. ? Place a towel between your skin and the heat source. ? Leave the heat on for 20-30 minutes. ? Remove the heat if your skin turns bright red. This is especially important if you are unable to feel pain, heat, or cold. You may have a greater risk of getting burned. Eating and drinking   Drink enough fluid to keep your urine pale yellow.  Resume your normal diet as instructed by your health care provider. Avoid heavy or fried foods that are hard to digest.  Avoid drinking alcohol for as long as instructed by your health care provider. Contact a health care provider if:  You have blood in your stool 2-3 days after the procedure. Get help right away if:  You have more than a small spotting of blood in your stool.  You pass large  blood clots in your stool.  Your abdomen is swollen.  You have nausea or vomiting.  You have a fever.  You have increasing abdominal pain that is not relieved with medicine. Summary  After the procedure, it is common to have a small amount of blood in your stool. You may also have mild abdominal cramping and bloating.  For the first 24 hours after the procedure, do not drive or use machinery, sign important documents, or drink alcohol.  Contact your health care provider if you have a lot of blood in your stool, nausea or vomiting, a fever, or increased abdominal pain. This information is not intended to replace advice given to you by your health care provider.  Make sure you discuss any questions you have with your health care provider. Document Released: 07/17/2004 Document Revised: 09/25/2017 Document Reviewed: 02/14/2016 Elsevier Interactive Patient Education  2019 [employer].

## 2019-04-08 NOTE — Telephone Encounter (Addendum)
Called patient TO DISCUSS COMPLAINT. LATELY HIS DIARRHEA HAS GOTTEN ALOT WORSE. DIARRHEA PERSIST IN SPITE OF AVOIDING DIARRHEA. RARE ABDOMINAL PAIN. NO RECTAL BLEEDING. HAVING 3-6 A DAY. NEAR ACCIDENTS AND RECTAL URGENCY WITH AT LEAST HALF OF THEM.   PROCEED WITH TCS FOR UNCONTROLLED DIARRHEA WITH RECTAL URGENCY.  WANTS TO [place] DR[address] FIELDS FOR CLENPIQ VOUCHER.

## 2019-04-09 ENCOUNTER — Encounter (HOSPITAL_COMMUNITY): Payer: Self-pay

## 2019-04-09 ENCOUNTER — Other Ambulatory Visit: Payer: Self-pay

## 2019-04-09 ENCOUNTER — Encounter (HOSPITAL_COMMUNITY)
Admission: RE | Admit: 2019-04-09 | Discharge: 2019-04-09 | Disposition: A | Discharge status: Home / Self Care | Payer: Medicare HMO | Source: Ambulatory Visit | Attending: Gastroenterology | Admitting: Gastroenterology

## 2019-04-09 DIAGNOSIS — Z01812 Encounter for preprocedural laboratory examination: Secondary | ICD-10-CM | POA: Diagnosis present

## 2019-04-09 DIAGNOSIS — I10 Essential (primary) hypertension: Secondary | ICD-10-CM | POA: Insufficient documentation

## 2019-04-09 DIAGNOSIS — R197 Diarrhea, unspecified: Secondary | ICD-10-CM | POA: Insufficient documentation

## 2019-04-09 LAB — CBC WITH DIFFERENTIAL/PLATELET
Abs Immature Granulocytes: 0.01 10*3/uL (ref 0.00–0.07)
Basophils Absolute: 0 10*3/uL (ref 0.0–0.1)
Basophils Relative: 1 %
Eosinophils Absolute: 0.1 10*3/uL (ref 0.0–0.5)
Eosinophils Relative: 1 %
HCT: 42.9 % (ref 39.0–52.0)
Hemoglobin: 14 g/dL (ref 13.0–17.0)
Immature Granulocytes: 0 %
Lymphocytes Relative: 29 %
Lymphs Abs: 1.6 10*3/uL (ref 0.7–4.0)
MCH: 27.3 pg (ref 26.0–34.0)
MCHC: 32.6 g/dL (ref 30.0–36.0)
MCV: 83.6 fL (ref 80.0–100.0)
Monocytes Absolute: 0.5 10*3/uL (ref 0.1–1.0)
Monocytes Relative: 9 %
Neutro Abs: 3.3 10*3/uL (ref 1.7–7.7)
Neutrophils Relative %: 60 %
Platelets: 173 10*3/uL (ref 150–400)
RBC: 5.13 MIL/uL (ref 4.22–5.81)
RDW: 15.6 % — ABNORMAL HIGH (ref 11.5–15.5)
WBC: 5.4 10*3/uL (ref 4.0–10.5)
nRBC: 0 % (ref 0.0–0.2)

## 2019-04-09 LAB — BASIC METABOLIC PANEL
Anion gap: 9 (ref 5–15)
BUN: 23 mg/dL (ref 8–23)
CO2: 24 mmol/L (ref 22–32)
Calcium: 9.3 mg/dL (ref 8.9–10.3)
Chloride: 106 mmol/L (ref 98–111)
Creatinine, Ser: 0.99 mg/dL (ref 0.61–1.24)
GFR calc Af Amer: >60 mL/min (ref >60)
GFR calc non Af Amer: >60 mL/min (ref >60)
Glucose, Bld: 112 mg/dL — ABNORMAL HIGH (ref 70–99)
Potassium: 4.2 mmol/L (ref 3.5–5.1)
Sodium: 139 mmol/L (ref 135–145)

## 2019-04-10 NOTE — Telephone Encounter (Signed)
Pt called office and LMOVM requesting call back to discuss TCS prep.  Called pt, he was told at pre-op to arrive at 7:00am for TCS. Per his instructions he is to start drinking 2nd half of prep the morning of procedure at 3:30am. He is afraid he may have an accident on the way to the hospital. Advised pt he may start drinking prep earlier and to complete within 2 hours.

## 2019-04-13 ENCOUNTER — Encounter (HOSPITAL_COMMUNITY): Payer: Self-pay

## 2019-04-13 ENCOUNTER — Encounter (HOSPITAL_COMMUNITY)
Admission: RE | Disposition: A | Discharge status: Home / Self Care | Payer: Self-pay | Source: Home / Self Care | Attending: Gastroenterology

## 2019-04-13 ENCOUNTER — Ambulatory Visit (HOSPITAL_COMMUNITY): Payer: Medicare HMO | Admitting: Anesthesiology

## 2019-04-13 ENCOUNTER — Other Ambulatory Visit: Payer: Self-pay

## 2019-04-13 ENCOUNTER — Ambulatory Visit (HOSPITAL_COMMUNITY)
Admission: RE | Admit: 2019-04-13 | Discharge: 2019-04-13 | Disposition: A | Discharge status: Home / Self Care | Payer: Medicare HMO | Attending: Gastroenterology | Admitting: Gastroenterology

## 2019-04-13 DIAGNOSIS — Q438 Other specified congenital malformations of intestine: Secondary | ICD-10-CM | POA: Diagnosis not present

## 2019-04-13 DIAGNOSIS — K219 Gastro-esophageal reflux disease without esophagitis: Secondary | ICD-10-CM | POA: Diagnosis not present

## 2019-04-13 DIAGNOSIS — E739 Lactose intolerance, unspecified: Secondary | ICD-10-CM | POA: Diagnosis not present

## 2019-04-13 DIAGNOSIS — Z87898 Personal history of other specified conditions: Secondary | ICD-10-CM | POA: Insufficient documentation

## 2019-04-13 DIAGNOSIS — K529 Noninfective gastroenteritis and colitis, unspecified: Secondary | ICD-10-CM

## 2019-04-13 DIAGNOSIS — Z87891 Personal history of nicotine dependence: Secondary | ICD-10-CM | POA: Insufficient documentation

## 2019-04-13 DIAGNOSIS — E785 Hyperlipidemia, unspecified: Secondary | ICD-10-CM | POA: Insufficient documentation

## 2019-04-13 DIAGNOSIS — K644 Residual hemorrhoidal skin tags: Secondary | ICD-10-CM | POA: Diagnosis not present

## 2019-04-13 DIAGNOSIS — Z85828 Personal history of other malignant neoplasm of skin: Secondary | ICD-10-CM | POA: Insufficient documentation

## 2019-04-13 DIAGNOSIS — Z79899 Other long term (current) drug therapy: Secondary | ICD-10-CM | POA: Insufficient documentation

## 2019-04-13 DIAGNOSIS — R197 Diarrhea, unspecified: Secondary | ICD-10-CM | POA: Diagnosis present

## 2019-04-13 DIAGNOSIS — K648 Other hemorrhoids: Secondary | ICD-10-CM | POA: Insufficient documentation

## 2019-04-13 DIAGNOSIS — K635 Polyp of colon: Secondary | ICD-10-CM

## 2019-04-13 DIAGNOSIS — K6389 Other specified diseases of intestine: Secondary | ICD-10-CM | POA: Insufficient documentation

## 2019-04-13 DIAGNOSIS — I10 Essential (primary) hypertension: Secondary | ICD-10-CM | POA: Insufficient documentation

## 2019-04-13 DIAGNOSIS — D122 Benign neoplasm of ascending colon: Secondary | ICD-10-CM

## 2019-04-13 DIAGNOSIS — D123 Benign neoplasm of transverse colon: Secondary | ICD-10-CM | POA: Diagnosis not present

## 2019-04-13 HISTORY — PX: COLONOSCOPY WITH PROPOFOL: SHX5780

## 2019-04-13 HISTORY — PX: POLYPECTOMY: SHX5525

## 2019-04-13 HISTORY — PX: BIOPSY: SHX5522

## 2019-04-13 SURGERY — COLONOSCOPY WITH PROPOFOL
Anesthesia: General

## 2019-04-13 MED ORDER — CHLORHEXIDINE GLUCONATE CLOTH 2 % EX PADS: 6.0000 | MEDICATED_PAD | Freq: Once | CUTANEOUS | Status: DC

## 2019-04-13 MED ORDER — PROMETHAZINE HCL 25 MG/ML IJ SOLN: 6.2500 mg | INTRAMUSCULAR | Status: DC | PRN

## 2019-04-13 MED ORDER — EPHEDRINE SULFATE 50 MG/ML IJ SOLN
INTRAMUSCULAR | Status: DC | PRN
  Administered 2019-04-13 (×2): 10 mg via INTRAVENOUS

## 2019-04-13 MED ORDER — HYDROMORPHONE HCL 1 MG/ML IJ SOLN: 0.2500 mg | INTRAMUSCULAR | Status: DC | PRN

## 2019-04-13 MED ORDER — MIDAZOLAM HCL 2 MG/2ML IJ SOLN: 0.5000 mg | Freq: Once | INTRAMUSCULAR | Status: DC | PRN

## 2019-04-13 MED ORDER — KETAMINE HCL 10 MG/ML IJ SOLN
INTRAMUSCULAR | Status: DC | PRN
  Administered 2019-04-13: 10 mg via INTRAVENOUS

## 2019-04-13 MED ORDER — PROPOFOL 500 MG/50ML IV EMUL
INTRAVENOUS | Status: DC | PRN
  Administered 2019-04-13: 150 ug/kg/min via INTRAVENOUS

## 2019-04-13 MED ORDER — HYDROCODONE-ACETAMINOPHEN 7.5-325 MG PO TABS: 1.0000 | ORAL_TABLET | Freq: Once | ORAL | Status: DC | PRN

## 2019-04-13 MED ORDER — PROPOFOL 10 MG/ML IV BOLUS
INTRAVENOUS | Status: DC | PRN
  Administered 2019-04-13: 20 mg via INTRAVENOUS

## 2019-04-13 MED ORDER — LACTATED RINGERS IV SOLN
INTRAVENOUS | Status: DC
  Administered 2019-04-13: 08:00:00 via INTRAVENOUS

## 2019-04-13 MED ORDER — KETAMINE HCL 50 MG/5ML IJ SOSY
PREFILLED_SYRINGE | INTRAMUSCULAR | Status: AC
  Filled 2019-04-13: qty 5

## 2019-04-13 NOTE — H&P (Signed)
Primary Care Physician:  Tamera Punt, DO Primary Gastroenterologist:  Dr. Oneida Alar  Pre-Procedure History & Physical: HPI:  Ronnie Patrick is a 68 y.o. male here for  DIARRHEA.  Past Medical History:  Diagnosis Date  . Basal cell carcinoma (BCC)    skin cancer on scalp-been removed  . GERD (gastroesophageal reflux disease)   . HTN (hypertension)   . Hyperlipidemia   . Jock itch    LAMISIL/TERBINAFINE REQUIRED  . Lyme disease JUN 2016    Past Surgical History:  Procedure Laterality Date  . BIOPSY  02/20/2017   Procedure: BIOPSY;  Surgeon: Danie Binder, MD;  [facility];  Service: Endoscopy;;  gastric and esophageal  . COLONOSCOPY  DEC 2015 [facility]   [facility]  . ESOPHAGOGASTRODUODENOSCOPY N/A 02/20/2017   Procedure: ESOPHAGOGASTRODUODENOSCOPY (EGD);  Surgeon: Danie Binder, MD;  [facility];  Service: Endoscopy;  Laterality: N/A;  215  . EXCISIONAL HEMORRHOIDECTOMY     AGE 56-PAINFUL  . FLEXIBLE SIGMOIDOSCOPY N/A 07/15/2015   Procedure: FLEXIBLE SIGMOIDOSCOPY;  Surgeon: Danie Binder, MD;  [facility];  Service: Endoscopy;  Laterality: N/A;  100 - moved to 12:00 - office to notify  . HEMORRHOID BANDING N/A 07/15/2015   Procedure: HEMORRHOID BANDING;  Surgeon: Danie Binder, MD;  [facility];  Service: Endoscopy;  Laterality: N/A;  . TONSILLECTOMY AND ADENOIDECTOMY     AS A CHILD  . UPPER GASTROINTESTINAL ENDOSCOPY  DEC 2015 [facility]   GERD, [place]    Prior to Admission medications   Medication Sig Start Date End Date Taking? Authorizing Provider  acetaminophen (TYLENOL) 500 MG tablet Take 500 mg by mouth 2 (two) times daily.   Yes [provider]  acyclovir (ZOVIRAX) 400 MG tablet Take 400 mg by mouth 2 (two) times daily. 01/20/17  Yes [provider]  atorvastatin (LIPITOR) 40 MG tablet Take 40 mg by mouth at bedtime.  04/23/17  Yes [provider]  azelastine (ASTELIN) 0.1 % nasal spray Place 1 spray into both  nostrils 2 (two) times daily. Use in each nostril as directed   Yes [provider]  fexofenadine (ALLEGRA) 180 MG tablet Take 180 mg by mouth 2 (two) times daily as needed (allergies.).    Yes [provider]  fluticasone (CUTIVATE) 0.05 % cream Apply 1 application topically every evening. After showering 11/26/16  Yes [provider]  fluticasone (FLONASE) 50 MCG/ACT nasal spray Place 1 spray into both nostrils 2 (two) times daily.   Yes [provider]  Glycerin-Hypromellose-PEG 400 0.2-0.2-1 % SOLN Place 1 drop into both eyes 3 (three) times daily as needed (for dry/irritated eyes.). Visine Dry Eye Relief   Yes [provider]  ketoconazole (NIZORAL) 2 % cream Apply 1 application topically every evening. After showering   Yes [provider]  metoprolol tartrate (LOPRESSOR) 25 MG tablet Take 25 mg by mouth 2 (two) times daily. 03/15/19  Yes [provider]  Multiple Vitamin (MULTIVITAMIN WITH MINERALS) TABS tablet Take 1 tablet by mouth daily.   Yes [provider]  nystatin (NYSTATIN) powder Apply 1 g topically every evening. After showering.   Yes [provider]  pantoprazole (PROTONIX) 40 MG tablet Take 1 tablet (40 mg total) by mouth daily before breakfast. 12/18/18  Yes Mahala Menghini, PA-C  senna (SENOKOT) 8.6 MG TABS tablet Take 3 tablets by mouth at bedtime.    Yes [provider]  sildenafil (VIAGRA) 100 MG tablet Take  100 mg by mouth daily as needed for erectile dysfunction.   Yes [provider]  sodium chloride (OCEAN) 0.65 % SOLN nasal spray Place 1 spray into both nostrils as needed for congestion.   Yes [provider]  terbinafine (LAMISIL) 1 % cream Apply 1 application topically every evening. After showering   Yes [provider]  VENTOLIN HFA 108 (90 BASE) MCG/ACT inhaler Inhale 2 puffs into the lungs every 4 (four) hours as needed for wheezing or shortness of  breath.  04/28/15  Yes [provider]  calcium carbonate (TUMS EX) 750 MG chewable tablet Chew 1 tablet by mouth 3 (three) times daily as needed for heartburn.    [provider]  lidocaine (XYLOCAINE) 2 % solution Use as directed 15 mLs in the mouth or throat 2 (two) times daily as needed (gerd/chest pain.).     [provider]    Allergies as of 02/16/2019 - Review Complete 02/16/2019  Allergen Reaction Noted  . Bacitracin Hives 02/28/2017  . Flagyl [metronidazole] Itching 06/29/2015  . Neosporin [neomycin-bacitracin zn-polymyx] Hives 02/28/2017  . Penicillins Itching 06/29/2015    Family History  Problem Relation Age of Onset  . Allergic rhinitis Mother   . Eczema Mother   . Urticaria Mother   . Asthma Father   . Colon cancer Neg Hx   . Colon polyps Neg Hx     Social History   Socioeconomic History  . Marital status: Divorced    Spouse name: Not on file  . Number of children: Not on file  . Years of education: Not on file  . Highest education level: Not on file  Occupational History  . Not on file  Social Needs  . Financial resource strain: Not on file  . Food insecurity:    Worry: Not on file    Inability: Not on file  . Transportation needs:    Medical: Not on file    Non-medical: Not on file  Tobacco Use  . Smoking status: Former Smoker    Packs/day: 1.00    Years: 34.00    Pack years: 34.00    Last attempt to quit: 07/14/2001    Years since quitting: 17.7  . Smokeless tobacco: Never Used  . Tobacco comment: Quit in 2002  Substance and Sexual Activity  . Alcohol use: No    Alcohol/week: 0.0 standard drinks  . Drug use: No  . Sexual activity: Yes  Lifestyle  . Physical activity:    Days per week: Not on file    Minutes per session: Not on file  . Stress: Not on file  Relationships  . Social connections:    Talks on phone: Not on file    Gets together: Not on file    Attends religious service: Not on file    Active member  of club or organization: Not on file    Attends meetings of clubs or organizations: Not on file    Relationship status: Not on file  . Intimate partner violence:    Fear of current or ex partner: Not on file    Emotionally abused: Not on file    Physically abused: Not on file    Forced sexual activity: Not on file  Other Topics Concern  . Not on file  Social History Narrative   ORIGINALLY FROM [place]. 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  2 SONS: AGER 43, 37 AND THEY LIVE IN [place] WITH EX-WIFE.      RARE ETOH OR SWEETS. HAS A TWIN BROTHER WITH DIABETES.    Review of Systems: See HPI, otherwise negative ROS   Physical Exam: BP (!) 144/89   Pulse 71   Temp 98.4 F (36.9 C) (Oral)   Resp 16   Ht 5\' 6"  (1.676 m)   Wt 63.5 kg   BMI 22.60 kg/m  General:   Alert,  pleasant and cooperative in NAD Head:  Normocephalic and atraumatic. Neck:  Supple; Lungs:  Clear throughout to auscultation.    Heart:  Regular rate and rhythm. Abdomen:  Soft, nontender and nondistended. Normal bowel sounds, without guarding, and without rebound.   Neurologic:  Alert and  oriented x4;  grossly normal neurologically.  Impression/Plan:     Diarrhea  PLAN: TCS TODAY WITH BIOPSY DISCUSSED PROCEDURE, BENEFITS, & RISKS: < 1% chance of medication reaction, bleeding, perforation, ASPIRATION, or rupture of spleen/liver requiring surgery to fix it and missed polyps < 1 cm 10-20% of the time.

## 2019-04-13 NOTE — Op Note (Signed)
[facility] Patient Name: Ronnie Patrick Procedure Date: 04/13/2019 8:18 AM MRN: [id number] Date of Birth: February 09, 1951 Attending MD: Barney Drain MD, MD CSN: [id number] Age: 68 Admit Type: Outpatient Procedure:                Colonoscopy WITH COLD FORCEPS BIOPSY/COLD SNARE                            POLYPECTOMY Indications:              Diarrhea Providers:                Barney Drain MD, MD, Otis Peak B. Sharon Seller, RN,                            Raphael Gibney, Technician, Aram Candela Referring MD:             Tamera Punt, DO Medicines:                Propofol per Anesthesia Complications:            No immediate complications. Estimated Blood Loss:     Estimated blood loss was minimal. Procedure:                Pre-Anesthesia Assessment:                           - Prior to the procedure, a History and Physical                            was performed, and patient medications and                            allergies were reviewed. The patient's tolerance of                            previous anesthesia was also reviewed. The risks                            and benefits of the procedure and the sedation                            options and risks were discussed with the patient.                            All questions were answered, and informed consent                            was obtained. Prior Anticoagulants: The patient has                            taken no previous anticoagulant or antiplatelet                            agents. ASA Grade Assessment: II - A patient with  mild systemic disease. After reviewing the risks                            and benefits, the patient was deemed in                            satisfactory condition to undergo the procedure.                            After obtaining informed consent, the colonoscope                            was passed under direct vision. Throughout the   procedure, the patient's blood pressure, pulse, and                            oxygen saturations were monitored continuously. The                            CF-[id number] ([id number]) scope was introduced through                            the anus and advanced to the 10 cm into the ileum.                            The colonoscopy was somewhat difficult due to a                            tortuous colon. Successful completion of the                            procedure was aided by straightening and shortening                            the scope to obtain bowel loop reduction and                            COLOWRAP. The patient tolerated the procedure well.                            The quality of the bowel preparation was good. The                            terminal ileum, ileocecal valve, appendiceal                            orifice, and rectum were photographed. Scope In: 8:40:24 AM Scope Out: 9:03:16 AM Scope Withdrawal Time: 0 hours 20 minutes 35 seconds  Total Procedure Duration: 0 hours 22 minutes 52 seconds  Findings:      The terminal ileum appeared normal.      Two sessile polyps were found in the hepatic flexure. The polyps were 4       to 5 mm in size. These polyps were removed with a cold  snare. Resection       and retrieval were complete.      Normal mucosa was found in the entire colon. Biopsies for histology were       taken with a cold forceps from the cecum, ascending colon, transverse       colon, descending colon and sigmoid colon for evaluation of microscopic       colitis.      External and internal hemorrhoids were found. The hemorrhoids were       moderate.      The recto-sigmoid colon and sigmoid colon were mildly tortuous. Impression:               - Two 4 to 5 mm polyps at the hepatic flexure,                            removed with a cold snare. Resected and retrieved.                           - External and internal hemorrhoids.                           -  MILDLY Tortuous LEFT colon.                           - NO OBVIOUS SOURCE FOR DIARRHEA IDENTIFIED Moderate Sedation:      Per Anesthesia Care Recommendation:           - Patient has a contact number available for                            emergencies. The signs and symptoms of potential                            delayed complications were discussed with the                            patient. Return to normal activities tomorrow.                            Written discharge instructions were provided to the                            patient.                           - High fiber/LACTOSE FREE diet.                           - Continue present medications. HOLD SENOKOT.                           - Await pathology results.                           - Repeat colonoscopy in 5-10 years for surveillance.                           -  Return to GI office in 4 months. Procedure Code(s):        --- Professional ---                           [phone], Colonoscopy, flexible; with removal of                            tumor(s), polyp(s), or other lesion(s) by snare                            technique                           45380, 67, Colonoscopy, flexible; with biopsy,                            single or multiple Diagnosis Code(s):        --- Professional ---                           K64.8, Other hemorrhoids                           K63.5, Polyp of colon                           R19.7, Diarrhea, unspecified                           Q43.8, Other specified congenital malformations of                            intestine CPT copyright 2019 [employer]. All rights reserved. The codes documented in this report are preliminary and upon coder review may  be revised to meet current compliance requirements. Barney Drain, MD Barney Drain MD, MD 04/13/2019 9:14:10 AM This report has been signed electronically. Number of Addenda: 0

## 2019-04-13 NOTE — Anesthesia Procedure Notes (Signed)
Procedure Name: MAC Date/Time: 04/13/2019 8:30 AM Performed by: Andree Elk Nataliah Hatlestad A, CRNA Pre-anesthesia Checklist: Patient identified, Emergency Drugs available, Suction available, Timeout performed and Patient being monitored Patient Re-evaluated:Patient Re-evaluated prior to induction Oxygen Delivery Method: Non-rebreather mask

## 2019-04-13 NOTE — Transfer of Care (Signed)
Immediate Anesthesia Transfer of Care Note  Patient: Ronnie Patrick  Procedure(s) Performed: COLONOSCOPY WITH PROPOFOL (N/A ) BIOPSY POLYPECTOMY  Patient Location: PACU  Anesthesia Type:General  Level of Consciousness: awake, alert , oriented and patient cooperative  Airway & Oxygen Therapy: Patient Spontanous Breathing  Post-op Assessment: Report given to RN and Post -op Vital signs reviewed and stable  Post vital signs: Reviewed and stable  Last Vitals:  Vitals Value Taken Time  BP 104/42 04/13/2019  9:12 AM  Temp    Pulse 79 04/13/2019  9:14 AM  Resp 15 04/13/2019  9:14 AM  SpO2 99 % 04/13/2019  9:14 AM  Vitals shown include unvalidated device data.  Last Pain:  Vitals:   04/13/19 0832  TempSrc:   PainSc: 0-No pain      Patients Stated Pain Goal: 3 (88/91/69 4503)  Complications: No apparent anesthesia complications

## 2019-04-13 NOTE — Anesthesia Preprocedure Evaluation (Signed)
Anesthesia Evaluation  Patient identified by MRN, date of birth, ID band Patient awake  General Assessment Comment:Reports being out of it after EGD in past  Probable Fent/Versed in past   Reviewed: Allergy & Precautions, NPO status , Patient's Chart, lab work & pertinent test results, reviewed documented beta blocker date and time   History of Anesthesia Complications (+) history of anesthetic complications  Airway Mallampati: II  TM Distance: >3 FB Neck ROM: Full    Dental no notable dental hx. (+) Teeth Intact   Pulmonary asthma , former smoker,  Used inhaler today -o/w rarely uses    Pulmonary exam normal breath sounds clear to auscultation       Cardiovascular Exercise Tolerance: Good hypertension, Pt. on medications and Pt. on home beta blockers negative cardio ROS Normal cardiovascular examI Rhythm:Regular Rate:Normal     Neuro/Psych negative neurological ROS  negative psych ROS   GI/Hepatic Neg liver ROS, GERD  Medicated and Controlled,Denies GERD Sx today   Endo/Other  negative endocrine ROS  Renal/GU negative Renal ROS  negative genitourinary   Musculoskeletal negative musculoskeletal ROS (+)   Abdominal   Peds negative pediatric ROS (+)  Hematology negative hematology ROS (+)   Anesthesia Other Findings   Reproductive/Obstetrics negative OB ROS                             Anesthesia Physical Anesthesia Plan  ASA: II  Anesthesia Plan: General   Post-op Pain Management:    Induction: Intravenous  PONV Risk Score and Plan:   Airway Management Planned: Nasal Cannula and Simple Face Mask  Additional Equipment:   Intra-op Plan:   Post-operative Plan: Extubation in OR  Informed Consent: I have reviewed the patients History and Physical, chart, labs and discussed the procedure including the risks, benefits and alternatives for the proposed anesthesia with the  patient or authorized representative who has indicated his/her understanding and acceptance.     Dental advisory given  Plan Discussed with: CRNA  Anesthesia Plan Comments: (Full PPE use planned  GA vs GETA as needed d/w pt - WTP with same  Dr. Oneida Alar deemed this case Appropriate in light of current COVID concerns)        Anesthesia Quick Evaluation

## 2019-04-13 NOTE — Anesthesia Postprocedure Evaluation (Signed)
Anesthesia Post Note  Patient: Ronnie Patrick  Procedure(s) Performed: COLONOSCOPY WITH PROPOFOL (N/A ) BIOPSY POLYPECTOMY  Patient location during evaluation: PACU Anesthesia Type: General Level of consciousness: awake and alert and oriented Pain management: pain level controlled Vital Signs Assessment: post-procedure vital signs reviewed and stable Respiratory status: spontaneous breathing Cardiovascular status: stable Postop Assessment: no apparent nausea or vomiting Anesthetic complications: no     Last Vitals:  Vitals:   04/13/19 0724 04/13/19 0913  BP: (!) 144/89 (!) (P) 104/42  Pulse: 71   Resp: 16   Temp: 36.9 C (P) 36.6 C  SpO2:  99%    Last Pain:  Vitals:   04/13/19 0832  TempSrc:   PainSc: 0-No pain                 Amarilis Belflower A

## 2019-04-14 NOTE — Discharge Instructions (Signed)
NO SOURCE FOR YOUR DIARRHEA WAS IDENTIFIED. You had 2 polyps removed. You have moderate external and internal hemorrhoids. THE LAST PART OF YOUR SMALL BOWEL IS NORMAL.  I BIOPSIED YOUR COLON.    DRINK WATER TO KEEP YOUR URINE LIGHT YELLOW.  FOLLOW A DAIRY FREE/HIGH FIBER DIET. AVOID ITEMS THAT CAUSE BLOATING AND GAS. SEE INFO BELOW. Reading food labels is important. Many products contain lactose even when they are not made from milk. Look for the following words: whey, milk solids, dry milk solids, nonfat dry milk powder. Typical sources of lactose other than dairy products include breads, candies, cold cuts, prepared and processed foods, and commercial sauces and gravies.    IF YOU CONSUME DAIRY, ADD LACTASE 3 PILLS WITH MEALS UP TO THREE TIMES A DAY.  USE IMODIUM IF NEEDED TO CONTROL DIARRHEA.  HOLD SENOKOT if you have diarrhea.  USE PREPARATION H FOUR TIMES  A DAY IF NEEDED TO RELIEVE RECTAL PAIN/PRESSURE/BLEEDING.    YOUR BIOPSY RESULTS WILL BE BACK IN 5 BUSINESS DAYS.  FOLLOW UP IN 4 MOS.  Next colonoscopy AT AGE 68. Colonoscopy Care After Read the instructions outlined below and refer to this sheet in the next week. These discharge instructions provide you with general information on caring for yourself after you leave the hospital. While your treatment has been planned according to the most current medical practices available, unavoidable complications occasionally occur. If you have any problems or questions after discharge, call DR. Vennela Jutte, [phone].  ACTIVITY  You may resume your regular activity, but move at a slower pace for the next 24 hours.   Take frequent rest periods for the next 24 hours.   Walking will help get rid of the air and reduce the bloated feeling in your belly (abdomen).   No driving for 24 hours (because of the medicine (anesthesia) used during the test).   You may shower.   Do not sign any important legal documents or operate any machinery for  24 hours (because of the anesthesia used during the test).    NUTRITION  Drink plenty of fluids.   You may resume your normal diet as instructed by your doctor.   Begin with a light meal and progress to your normal diet. Heavy or fried foods are harder to digest and may make you feel sick to your stomach (nauseated).   Avoid alcoholic beverages for 24 hours or as instructed.    MEDICATIONS  You may resume your normal medications.   WHAT YOU CAN EXPECT TODAY  Some feelings of bloating in the abdomen.   Passage of more gas than usual.   Spotting of blood in your stool or on the toilet paper  .  IF YOU HAD POLYPS REMOVED DURING THE COLONOSCOPY:  Eat a soft diet IF YOU HAVE NAUSEA, BLOATING, ABDOMINAL PAIN, OR VOMITING.    FINDING OUT THE RESULTS OF YOUR TEST Not all test results are available during your visit. DR. Oneida Alar WILL CALL YOU WITHIN 14 DAYS OF YOUR PROCEDUE WITH YOUR RESULTS. Do not assume everything is normal if you have not heard from DR. Davidmichael Zarazua, CALL HER OFFICE AT [phone].  SEEK IMMEDIATE MEDICAL ATTENTION AND CALL THE OFFICE: [phone] IF:  You have more than a spotting of blood in your stool.   Your belly is swollen (abdominal distention).   You are nauseated or vomiting.   You have a temperature over 101F.   You have abdominal pain or discomfort that is severe or gets worse throughout the  day.   Lactose Free Diet Lactose is a carbohydrate that is found mainly in milk and milk products, as well as in foods with added milk or whey. Lactose must be digested by the enzyme in order to be used by the body. Lactose intolerance occurs when there is a shortage of lactase. When your body is not able to digest lactose, you may feel sick to your stomach (nausea), bloating, cramping, gas and diarrhea.  There are many dairy products that may be tolerated better than milk by some people:  The use of cultured dairy products such as yogurt, buttermilk,  cottage cheese, and sweet acidophilus milk (Kefir) for lactase-deficient individuals is usually well tolerated. This is because the healthy bacteria help digest lactose.   Lactose-hydrolyzed milk (Lactaid) contains 40-90% less lactose than milk and may also be well tolerated.    SPECIAL NOTES  Lactose is a carbohydrates. The major food source is dairy products. Reading food labels is important. Many products contain lactose even when they are not made from milk. Look for the following words: whey, milk solids, dry milk solids, nonfat dry milk powder. Typical sources of lactose other than dairy products include breads, candies, cold cuts, prepared and processed foods, and commercial sauces and gravies.   All foods must be prepared without milk, cream, or other dairy foods.   Soy milk and lactose-free supplements (LACTASE) may be used as an alternative to milk.   FOOD GROUP ALLOWED/RECOMMENDED AVOID/USE SPARINGLY  BREADS / STARCHES 4 servings or more* Breads and rolls made without milk. [place], [place], or [place] bread. Breads and rolls that contain milk. Prepared mixes such as muffins, biscuits, waffles, pancakes. Sweet rolls, donuts, [place] toast (if made with milk or lactose).  Crackers: Soda crackers, graham crackers. Any crackers prepared without lactose. Zwieback crackers, corn curls, or any that contain lactose.  Cereals: Cooked or dry cereals prepared without lactose (read labels). Cooked or dry cereals prepared with lactose (read labels). Total, Cocoa Krispies. Special K.  Potatoes / Pasta / Rice: Any prepared without milk or lactose. Popcorn. Instant potatoes, frozen [place] fries, scalloped or au gratin potatoes.  VEGETABLES 2 servings or more Fresh, frozen, and canned vegetables. Creamed or breaded vegetables. Vegetables in a cheese sauce or with lactose-containing margarines.  FRUIT 2 servings or more All fresh, canned, or frozen fruits that are not processed with lactose. Any  canned or frozen fruits processed with lactose.  MEAT & SUBSTITUTES 2 servings or more (4 to 6 oz. total per day) Plain beef, chicken, fish, [place], lamb, veal, pork, or ham. Kosher prepared meat products. Strained or junior meats that do not contain milk. Eggs, soy meat substitutes, nuts. Scrambled eggs, omelets, and souffles that contain milk. Creamed or breaded meat, fish, or fowl. Sausage products such as wieners, liver sausage, or cold cuts that contain milk solids. Cheese, cottage cheese, or cheese spreads.  MILK None. (See BEVERAGES for milk substitutes. See DESSERTS for ice cream and frozen desserts.) Milk (whole, 2%, skim, or chocolate). Evaporated, powdered, or condensed milk; malted milk.  SOUPS & COMBINATION FOODS Bouillon, broth, vegetable soups, clear soups, consomms. Homemade soups made with allowed ingredients. Combination or prepared foods that do not contain milk or milk products (read labels). Cream soups, chowders, commercially prepared soups containing lactose. Macaroni and cheese, pizza. Combination or prepared foods that contain milk or milk products.  DESSERTS & SWEETS In moderation Water and fruit ices; gelatin; angel food cake. Homemade cookies, pies, or cakes made from allowed ingredients. Pudding (  if made with water or a milk substitute). Lactose-free tofu desserts. Sugar, honey, corn syrup, jam, jelly; marmalade; molasses (beet sugar); Pure sugar candy; marshmallows. Ice cream, ice milk, sherbet, custard, pudding, frozen yogurt. Commercial cake and cookie mixes. Desserts that contain chocolate. Pie crust made with milk-containing margarine; reduced-calorie desserts made with a sugar substitute that contains lactose. Toffee, peppermint, butterscotch, chocolate, caramels.  FATS & OILS In moderation Butter (as tolerated; contains very small amounts of lactose). Margarines and dressings that do not contain milk, Vegetable oils, shortening, Miracle Whip, mayonnaise, nondairy  cream & whipped toppings without lactose or milk solids added (examples: Coffee Rich, Carnation Coffeemate, Rich's Whipped Topping, PolyRich). Berniece Salines. Margarines and salad dressings containing milk; cream, cream cheese; peanut butter with added milk solids, sour cream, chip dips, made with sour cream.  BEVERAGES Carbonated drinks; tea; coffee and freeze-dried coffee; some instant coffees (check labels). Fruit drinks; fruit and vegetable juice; Rice or Soy milk. Ovaltine, hot chocolate. Some cocoas; some instant coffees; instant iced teas; powdered fruit drinks (read labels).   CONDIMENTS / MISCELLANEOUS Soy sauce, carob powder, olives, gravy made with water, baker's cocoa, pickles, pure seasonings and spices, wine, pure monosodium glutamate, catsup, mustard. Some chewing gums, chocolate, some cocoas. Certain antibiotics and vitamin / mineral preparations. Spice blends if they contain milk products. MSG extender. Artificial sweeteners that contain lactose such as Equal (Nutra-Sweet) and Sweet 'n Low. Some nondairy creamers (read labels).   SAMPLE MENU*  Breakfast   Orange Juice.  Banana.   Bran flakes.   Nondairy Creamer.  [place] Bread (toasted).   Butter or milk-free margarine.   Coffee or tea.    Noon Meal   Chicken Breast.  Rice.   Green beans.   Butter or milk-free margarine.  Fresh melon.   Coffee or tea.    Evening Meal   Roast Beef.  Baked potato.   Butter or milk-free margarine.   Broccoli.   Lettuce salad with vinegar and oil dressing.  [employer]   Coffee or tea.      High-Fiber Diet A high-fiber diet changes your normal diet to include more whole grains, legumes, fruits, and vegetables. Changes in the diet involve replacing refined carbohydrates with unrefined foods. The calorie level of the diet is essentially unchanged. The Dietary Reference Intake (recommended amount) for adult males is 38 grams per day. For adult females, it is 25 grams  per day. Pregnant and lactating women should consume 28 grams of fiber per day. Fiber is the intact part of a plant that is not broken down during digestion. Functional fiber is fiber that has been isolated from the plant to provide a beneficial effect in the body.  PURPOSE  Increase stool bulk.   Ease and regulate bowel movements.   Lower cholesterol.   REDUCE RISK OF COLON CANCER  INDICATIONS THAT YOU NEED MORE FIBER  Constipation and hemorrhoids.   Uncomplicated diverticulosis (intestine condition) and irritable bowel syndrome.   Weight management.   As a protective measure against hardening of the arteries (atherosclerosis), diabetes, and cancer.   GUIDELINES FOR INCREASING FIBER IN THE DIET  Start adding fiber to the diet slowly. A gradual increase of about 5 more grams (2 slices of whole-wheat bread, 2 servings of most fruits or vegetables, or 1 bowl of high-fiber cereal) per day is best. Too rapid an increase in fiber may result in constipation, flatulence, and bloating.   Drink enough water and fluids to keep your urine clear or pale yellow.  Water, juice, or caffeine-free drinks are recommended. Not drinking enough fluid may cause constipation.   Eat a variety of high-fiber foods rather than one type of fiber.   Try to increase your intake of fiber through using high-fiber foods rather than fiber pills or supplements that contain small amounts of fiber.   The goal is to change the types of food eaten. Do not supplement your present diet with high-fiber foods, but replace foods in your present diet.   INCLUDE A VARIETY OF FIBER SOURCES  Replace refined and processed grains with whole grains, canned fruits with fresh fruits, and incorporate other fiber sources. White rice, white breads, and most bakery goods contain little or no fiber.   Brown whole-grain rice, buckwheat oats, and many fruits and vegetables are all good sources of fiber. These include: broccoli, Brussels  sprouts, cabbage, cauliflower, beets, sweet potatoes, white potatoes (skin on), carrots, tomatoes, eggplant, squash, berries, fresh fruits, and dried fruits.   Cereals appear to be the richest source of fiber. Cereal fiber is found in whole grains and bran. Bran is the fiber-rich outer coat of cereal grain, which is largely removed in refining. In whole-grain cereals, the bran remains. In breakfast cereals, the largest amount of fiber is found in those with "bran" in their names. The fiber content is sometimes indicated on the label.   You may need to include additional fruits and vegetables each day.   In baking, for 1 cup white flour, you may use the following substitutions:   1 cup whole-wheat flour minus 2 tablespoons.   1/2 cup white flour plus 1/2 cup whole-wheat flour.   Polyps, Colon  A polyp is extra tissue that grows inside your body. Colon polyps grow in the large intestine. The large intestine, also called the colon, is part of your digestive system. It is a long, hollow tube at the end of your digestive tract where your body makes and stores stool. Most polyps are not dangerous. They are benign. This means they are not cancerous. But over time, some types of polyps can turn into cancer. Polyps that are smaller than a pea are usually not harmful. But larger polyps could someday become or may already be cancerous. To be safe, doctors remove all polyps and test them.   PREVENTION There is not one sure way to prevent polyps. You might be able to lower your risk of getting them if you:  Eat more fruits and vegetables and less fatty food.   Do not smoke.   Avoid alcohol.   Exercise every day.   Lose weight if you are overweight.   Eating more calcium and folate can also lower your risk of getting polyps. Some foods that are rich in calcium are milk, cheese, and broccoli. Some foods that are rich in folate are chickpeas, kidney beans, and spinach.     Hemorrhoids Hemorrhoids  are dilated (enlarged) veins around the rectum. Sometimes clots will form in the veins. This makes them swollen and painful. These are called thrombosed hemorrhoids. Causes of hemorrhoids include:  Constipation.   Straining to have a bowel movement.   HEAVY LIFTING  HOME CARE INSTRUCTIONS  Eat a well balanced diet and drink 6 to 8 glasses of water every day to avoid constipation. You may also use a bulk laxative.   Avoid straining to have bowel movements.   Keep anal area dry and clean.   Do not use a donut shaped pillow or sit on the toilet for long  periods. This increases blood pooling and pain.   Move your bowels when your body has the urge; this will require less straining and will decrease pain and pressure.

## 2019-04-16 ENCOUNTER — Encounter: Payer: Self-pay | Admitting: Gastroenterology

## 2019-04-16 ENCOUNTER — Encounter (HOSPITAL_COMMUNITY): Payer: Self-pay | Admitting: Gastroenterology

## 2019-07-29 ENCOUNTER — Encounter: Payer: Self-pay | Admitting: Gastroenterology

## 2019-07-29 ENCOUNTER — Telehealth: Payer: Self-pay | Admitting: Gastroenterology

## 2019-07-29 NOTE — Telephone Encounter (Signed)
THINGS ARE WORSE: LAST COUPLE OF DAYS. RATHER TALK IN PERSON. NOT BEING RUDE. NEVER SENT TO [facility] FOR 2ND OPINION. WENT TO [facility] FOR ESOPHAGEAL MANOMETRY AND LACTOSE INTOLERANCE FOR HBT. WENT TO [facility] ONLY FOR PROCEDURES. STILL HAVING 50% SYMPTOMS WITH THE DIARRHEA.   SAID I DON'T LISTEN BECAUSE I RECOMMENDED PT FOLLOW HIGH FIBER/LACTOSE FREE DIET. LACTASE ENZYME PILLS DO NOT WORK FOR HIM. WENT 6 MOS ABSOLUTELY DAIRY FREE. MADE SURE HE LOOKS AT EVERYTHING. TAKING TWO PILLS THAT HAD LACTOSE IN THEM. ALSO TRIED AVOIDING POWDERED FRUIT DRINK AND USED STEVIA. HAS INTRODUCED DIARY IN HIS DIET AND NOW HAVING BINDING PROBLEMS DUE TO DAIRY PRODUCTS. DIETITIAN THOUGHT MAYBE COULD BE CAFFEINE/COFFEE AND AVOID COW'S MILK BECAUSE IT CAUSES DIARRHEA. ATE [place]KE RECENTLY AND THAT DID HIM IN. TOLERATED DARK CHOCOLATE ALMONDS A DAY. GOING DAIRY FREE REDUCED SYMPTOMS ~50%. MAY HAVE HARD STOOL.  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 FEELS IT'S ABSOLUTELY RIDICULOUS AND MAKES NO SENSE. EXPLAINED NO ALTERNATIVES. WANTS TO CHANGE  GI DOCS. DOESN'T WANT TO CHANGE MEDS AT THIS POINT AND WILL WAIT FOR APPT WITH DR. Claudie Leach.   PRACTICE IS NOT THE SAME AS WHEN HE FIRST CAME TO [place]. FEELS LIKE CAN'T GET AN APPT WITH ME OR EXTENDER. DISAPPOINTED THAT I FOUND NOTHING TO STOP DIARRHEA. IF STOPS SENNA WON'T GO TO THE BATHROOM AT ALL. IF DOESN'T EAT AN APPLE OR AN ORANGE. WILL TRY [place] DR. FEIN. [facility] IS NOW 58 MILES FROM HIS HOME. NOT CLOSER TO [facility]. WILL GET REFERRAL FROM PCP TO GO TO DR. Claudie Leach.  PT WOULD LIKE TO CANCEL ALL FUTURE APPTS AUG 28. HE WILL TRANSFER HIS CARE TO ANOTHER GI PRACTICE.

## 2019-07-30 NOTE — Telephone Encounter (Signed)
REVIEWED-NO ADDITIONAL RECOMMENDATIONS. 

## 2019-07-30 NOTE — Telephone Encounter (Signed)
noted 

## 2019-07-30 NOTE — Telephone Encounter (Signed)
Cancelled upcoming appointment and cancelled his recalls

## 2019-08-14 ENCOUNTER — Ambulatory Visit: Payer: Medicare HMO | Admitting: Gastroenterology

## 2019-11-30 ENCOUNTER — Other Ambulatory Visit: Payer: Self-pay | Admitting: Nurse Practitioner

## 2019-11-30 DIAGNOSIS — K219 Gastro-esophageal reflux disease without esophagitis: Secondary | ICD-10-CM

## 2020-08-25 ENCOUNTER — Other Ambulatory Visit: Payer: Self-pay

## 2020-08-25 ENCOUNTER — Encounter (INDEPENDENT_AMBULATORY_CARE_PROVIDER_SITE_OTHER): Payer: Self-pay | Admitting: Gastroenterology

## 2020-08-25 ENCOUNTER — Ambulatory Visit (INDEPENDENT_AMBULATORY_CARE_PROVIDER_SITE_OTHER): Payer: Medicare HMO | Admitting: Gastroenterology

## 2020-08-25 DIAGNOSIS — K589 Irritable bowel syndrome without diarrhea: Secondary | ICD-10-CM | POA: Insufficient documentation

## 2020-08-25 DIAGNOSIS — K4020 Bilateral inguinal hernia, without obstruction or gangrene, not specified as recurrent: Secondary | ICD-10-CM

## 2020-08-25 DIAGNOSIS — R1031 Right lower quadrant pain: Secondary | ICD-10-CM | POA: Insufficient documentation

## 2020-08-25 DIAGNOSIS — K4090 Unilateral inguinal hernia, without obstruction or gangrene, not specified as recurrent: Secondary | ICD-10-CM | POA: Insufficient documentation

## 2020-08-25 DIAGNOSIS — R1030 Lower abdominal pain, unspecified: Secondary | ICD-10-CM | POA: Diagnosis not present

## 2020-08-25 DIAGNOSIS — K582 Mixed irritable bowel syndrome: Secondary | ICD-10-CM | POA: Insufficient documentation

## 2020-08-25 DIAGNOSIS — R109 Unspecified abdominal pain: Secondary | ICD-10-CM | POA: Insufficient documentation

## 2020-08-25 MED ORDER — HYOSCYAMINE SULFATE 0.125 MG SL SUBL: 0.1250 mg | SUBLINGUAL_TABLET | Freq: Four times a day (QID) | SUBLINGUAL | 1 refills | Status: DC | PRN

## 2020-08-25 NOTE — Patient Instructions (Addendum)
Check which surgerons are in network for general surgery referral Take Levsin as needed fror abdominal pain Schedule CT abdomen/pelvis with IV contrast

## 2020-08-25 NOTE — Progress Notes (Signed)
Ronnie Patrick, M.D. Gastroenterology & Hepatology [facility] [address] [place], [place] [address] Primary Care Physician: Tamera Punt, [place] [id number]  Referring MD: PCP  I will communicate my assessment and recommendations to the referring MD via EMR. Note: Occasional unusual wording and randomly placed punctuation marks may result from the use of speech recognition technology to transcribe this document"  Chief Complaint: Abdominal pain, loose bowel movements and masses in the lower abdomen  History of Present Illness: Ronnie Patrick is a 69 y.o. male with past medical history of lactose intolerance, red scrotum syndrome, hypertension, hyperlipidemia, GERD, history of Lyme disease, who presents for evaluation of abdominal pain and loose bowel movements.  The patient was being evaluated by Dr. Oneida Alar in the past at least since 2013 he has presented recurrent episodes of loose bowel movements. He has presented intermittent change in his bowel movements. Due to this, he has undergone significant evaluation in the past which included celiac serology was negative. The patient also underwent last colonoscopy on 04/13/2019 which showed a serrated lesion in the hepatic flexure and a tubular adenoma, but random biopsies were negative for microscopic colitis. The patient also was evaluated by an outside gastroenterologist [place] [facility] who performed different tests on October 2020 including a fecal elastase that was 131, ova and parasites which were negative, fecal copper testing 108 normal. He underwent a CT of the abdomen and pelvis with IV contrast in November 2020 which showed mild to moderate fatty replacement of the pancreas but no presence of pancreatic ductal dilation, there was minimal pancreatic head calcifications. 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 The patient was prescribed Creon but he reports that his diarrhea got worse. He wasswitched to Zenpep with same result, for which the patient has stopped taking these medications.  The patient reports that he has noticed that for the last 2 months has presented burning sensation in his lower abdomen he has noticed two lumps in the inguinal area bilaterally. He reports that the pain improved when he lays flat but increases when he is standing or walks. He denies any correlation of the pain with his bowel movements. He reports that he usually has one bowel movement per day and he is currently taking MiraLAX per the recommendation of his PCP. He only reports that his stool is slightly loose but no presence of fresh blood or melena. Also reports that based on the CT scan findings and the fecal elastase result, he was started low-fat diet. He has not presented any improvement with his diet but he reports that he has lost 13 pounds.  The patient denies having any nausea, vomiting, fever, chills, hematochezia, melena, hematemesis, abdominal distention, jaundice, pruritus.  FHx: neg for any gastrointestinal/liver disease, no malignancies Social: Former smoker quit 20 years ago, denies alcohol or illicit drug use Surgical: non contributory  Past Medical History: Past Medical History:  Diagnosis Date  . Basal cell carcinoma (BCC)    skin cancer on scalp-been removed  . Chronic pancreatitis ([place])   . Exocrine pancreatic insufficiency   . Gastritis   . GERD (gastroesophageal reflux disease)   . Hiatal hernia   . HTN (hypertension)   . Hyperlipidemia   . Jock itch    LAMISIL/TERBINAFINE REQUIRED  . Lyme disease JUN 2016  Past Surgical History: Past Surgical History:  Procedure Laterality Date  . BIOPSY  02/20/2017   Procedure: BIOPSY;  Surgeon: Danie Binder, MD;  [facility];  Service:  Endoscopy;;  gastric and esophageal  . BIOPSY  04/13/2019   Procedure: BIOPSY;  Surgeon: Danie Binder, MD;  [facility];  Service: Endoscopy;;  colon  . COLONOSCOPY  DEC 2015 [facility]   [facility]  . COLONOSCOPY WITH PROPOFOL N/A 04/13/2019   Procedure: COLONOSCOPY WITH PROPOFOL;  Surgeon: Danie Binder, MD;  [facility];  Service: Endoscopy;  Laterality: N/A;  8:30am  . ESOPHAGOGASTRODUODENOSCOPY N/A 02/20/2017   Procedure: ESOPHAGOGASTRODUODENOSCOPY (EGD);  Surgeon: Danie Binder, MD;  [facility];  Service: Endoscopy;  Laterality: N/A;  215  . EXCISIONAL HEMORRHOIDECTOMY     AGE 35-PAINFUL  . FLEXIBLE SIGMOIDOSCOPY N/A 07/15/2015   Procedure: FLEXIBLE SIGMOIDOSCOPY;  Surgeon: Danie Binder, MD;  [facility];  Service: Endoscopy;  Laterality: N/A;  100 - moved to 12:00 - office to notify  . HEMORRHOID BANDING N/A 07/15/2015   Procedure: HEMORRHOID BANDING;  Surgeon: Danie Binder, MD;  [facility];  Service: Endoscopy;  Laterality: N/A;  . POLYPECTOMY  04/13/2019   Procedure: POLYPECTOMY;  Surgeon: Danie Binder, MD;  [facility];  Service: Endoscopy;;  colon  . TONSILLECTOMY AND ADENOIDECTOMY     AS A CHILD  . UPPER GASTROINTESTINAL ENDOSCOPY  DEC 2015 [facility]   GERD, HH    Family History: Family History  Problem Relation Age of Onset  . Allergic rhinitis Mother   . Eczema Mother   . Urticaria Mother   . Asthma Father   . Colon cancer Neg Hx   . Colon polyps Neg Hx     Social History: Social History   Tobacco Use  Smoking Status Former Smoker  . Packs/day: 1.00  . Years: 34.00  . Pack years: 34.00  . Quit date: 07/14/2001  . Years since quitting: 19.1  Smokeless Tobacco Never Used  Tobacco Comment   Quit in 2002   Social History   Substance and Sexual Activity  Alcohol Use No  . Alcohol/week: 0.0 standard drinks   Social History   Substance and Sexual Activity  Drug Use No     Allergies: Allergies  Allergen Reactions  . Bacitracin Hives  . Flagyl [Metronidazole] Itching  . Neosporin [Neomycin-Bacitracin Zn-Polymyx] Hives  . Penicillins Itching    Has patient had a PCN reaction causing immediate rash, facial/tongue/throat swelling, SOB or lightheadedness with hypotension: No Has patient had a PCN reaction causing severe rash involving mucus membranes or skin necrosis: No Has patient had a PCN reaction that required hospitalization No Has patient had a PCN reaction occurring within the last 10 years: No If all of the above answers are "NO", then may proceed with Cephalosporin use.     Medications: Current Outpatient Medications  Medication Sig Dispense Refill  . acetaminophen (TYLENOL) 500 MG tablet Take 500 mg by mouth 2 (two) times daily.    Marland Kitchen acyclovir (ZOVIRAX) 400 MG tablet Take 400 mg by mouth 2 (two) times daily.  2  . Aloe Vera GEL Apply topically as needed.    . calcium carbonate (TUMS EX) 750 MG chewable tablet Chew 1 tablet by mouth 3 (three) times daily as needed for heartburn.    . camphor-menthol (SARNA) lotion Apply 1 application topically as needed for itching.    . fexofenadine (ALLEGRA)  180 MG tablet Take 180 mg by mouth 2 (two) times daily as needed (allergies.).     Marland Kitchen fluticasone (FLONASE) 50 MCG/ACT nasal spray Place 1 spray into both nostrils 2 (two) times daily.    . Glycerin-Hypromellose-PEG 400 0.2-0.2-1 % SOLN Place 1 drop into both eyes 3 (three) times daily as needed (for dry/irritated eyes.). Visine Dry Eye Relief    . lidocaine (XYLOCAINE) 2 % solution Use as directed 15 mLs in the mouth or throat 2 (two) times daily as needed (gerd/chest pain.).     Marland Kitchen meloxicam (MOBIC) 15 MG tablet Take 15 mg by mouth daily.    . metoprolol tartrate (LOPRESSOR) 25 MG tablet Take 25 mg by mouth 2 (two) times daily.    . Multiple Vitamin (MULTIVITAMIN WITH MINERALS) TABS tablet Take 1 tablet by mouth daily.    . pantoprazole (PROTONIX) 40 MG  tablet Take 1 tablet (40 mg total) by mouth daily before breakfast. 90 tablet 3  . sildenafil (VIAGRA) 100 MG tablet Take 100 mg by mouth daily as needed for erectile dysfunction.    . sodium chloride (OCEAN) 0.65 % SOLN nasal spray Place 1 spray into both nostrils as needed for congestion.    . VENTOLIN HFA 108 (90 BASE) MCG/ACT inhaler Inhale 2 puffs into the lungs every 4 (four) hours as needed for wheezing or shortness of breath.      No current facility-administered medications for this visit.    Review of Systems: GENERAL: negative for malaise, night sweats HEENT: No changes in hearing or vision, no nose bleeds or other nasal problems. NECK: Negative for lumps, goiter, pain and significant neck swelling RESPIRATORY: Negative for cough, wheezing CARDIOVASCULAR: Negative for chest pain, leg swelling, palpitations, orthopnea GI: SEE HPI MUSCULOSKELETAL: Negative for joint pain or swelling, back pain, and muscle pain. SKIN: Negative for lesions, rash PSYCH: Negative for sleep disturbance, mood disorder and recent psychosocial stressors. HEMATOLOGY Negative for prolonged bleeding, bruising easily, and swollen nodes. ENDOCRINE: Negative for cold or heat intolerance, polyuria, polydipsia and goiter. NEURO: negative for tremor, gait imbalance, syncope and seizures. The remainder of the review of systems is noncontributory.   Physical Exam: BP (!) 167/84 (BP Location: Left Arm, Patient Position: Sitting, Cuff Size: Small)   Pulse 73   Temp 98 F (36.7 C) (Oral)   Ht 5\' 6"  (1.676 m)   Wt 145 lb 12.8 oz (66.1 kg)   BMI 23.53 kg/m  GENERAL: The patient is AO x3, in no acute distress. HEENT: Head is normocephalic and atraumatic. EOMI are intact. Mouth is well hydrated and without lesions. NECK: Supple. No masses LUNGS: Clear to auscultation. No presence of rhonchi/wheezing/rales. Adequate chest expansion HEART: RRR, normal s1 and s2. ABDOMEN: Soft, nontender, no guarding, no peritoneal  signs, and nondistended. BS +. Has presence of two mildly tender reducible masses in the inguinal area bilaterally. EXTREMITIES: Without any cyanosis, clubbing, rash, lesions or edema. NEUROLOGIC: AOx3, no focal motor deficit. SKIN: no jaundice, no rashes   Imaging/Labs: as above  I personally reviewed and interpreted the available labs, imaging and endoscopic files.  Impression and Plan: Akhil Piscopo is a 69 y.o. male with past medical history of lactose intolerance, red scrotum syndrome, hypertension, hyperlipidemia, GERD, history of Lyme disease, who presents for evaluation of abdominal pain and loose bowel movements. Regarding his loose bowel movements, I do not consider that he has findings consistent with EPI as the patient had slightly low pancreatic elastase which can be seen in patients due  to dilution in the stool. The patient does not have any risk factors for chronic pancreatitis and there is no mention of pancreatic atrophy, with minimal presence of calcifications. Also, the patient does not have any risk factors besides former smoking for chronic pancreatitis. Plus, he did not have any improvement while taking pancreatic enzymes which is unusual for EPI. Other etiologies for his history of diarrhea such as microscopic colitis, celiac disease or parasitic infections have been ruled out. I consider that his symptoms could be related to IBS-M which she has been able to manage with laxatives intermittently. Nevertheless, given the questionable findings in his former imaging, we will repeat a CT of the abdomen or hospital to obtain any evaluation of his pancreas. On the other hand, the patient has presented episodes of lower abdominal pain which could be related to his IBS, however I believe he has two inguinal hernias without strangulation or incarceration. I will prescribe Levsin in the interim to improve his symptomatology. However he has been presenting persistent symptoms, for which the  patient will benefit from an evaluation by general surgeon. He would like to get their opinion before proceeding with any surgery. We will referred the patient to this department once the patient has identified which physician is under his insurance coverage.  - Patient to check which surgeons are in network for general surgery referral - Start Levsin as needed fror abdominal pain - Schedule CT abdomen/pelvis with IV contrast - Continue with Miralax  All questions were answered.      Ronnie Peppers, MD Gastroenterology and Hepatology [facility]

## 2020-08-30 ENCOUNTER — Other Ambulatory Visit (INDEPENDENT_AMBULATORY_CARE_PROVIDER_SITE_OTHER): Payer: Self-pay | Admitting: Gastroenterology

## 2020-08-30 ENCOUNTER — Telehealth (INDEPENDENT_AMBULATORY_CARE_PROVIDER_SITE_OTHER): Payer: Self-pay | Admitting: *Deleted

## 2020-08-30 ENCOUNTER — Other Ambulatory Visit (INDEPENDENT_AMBULATORY_CARE_PROVIDER_SITE_OTHER): Payer: Self-pay | Admitting: *Deleted

## 2020-08-30 DIAGNOSIS — Z01812 Encounter for preprocedural laboratory examination: Secondary | ICD-10-CM

## 2020-08-30 DIAGNOSIS — R195 Other fecal abnormalities: Secondary | ICD-10-CM

## 2020-08-30 NOTE — Telephone Encounter (Signed)
Please call patient [phone]) is has concerns about meds you prescribed at [facility] last week. thanks

## 2020-08-30 NOTE — Progress Notes (Signed)
fec

## 2020-08-30 NOTE — Telephone Encounter (Signed)
Spoke with the patient who reported that he felt upset as he read the label of his antispasmodic medication, which reported that side effects could include lightheadedness, unsteadiness, and erectile dysfunction.  He reported that he did not take the medication as he was fearful that the side effects will happen to him.  I explained to him that most patients do not present any of these side effects but there is no way for me to assure him that he may not have one of the side effects listed in the medication label.  I explained to him that he could try Tylenol to improve his pain but he will need to check with his insurance to obtain the list of surgeons that are covered by his insurance. On the other hand, the patient reported that he read his records again and he noticed that his elastase was low in previous testing.  I explained to him that as I discussed with him in his previous appointment, I reviewed these records and this result could be stool dilution, he does not have any risk factors for EPI but due to the uncertainty of his diagnosis a CT scan was already ordered to confirm diagnosis.  I will order a spot fecal fat to check for malabsorption.  The patient understood and agreed.  Tammy, can you please send him the order for fecal fat by mail?  Thanks.  Ronnie Peppers, MD Gastroenterology and Hepatology [facility]

## 2020-08-31 NOTE — Telephone Encounter (Signed)
The Lab order was mailed to the patient per Dr.Castaneda.

## 2020-09-01 ENCOUNTER — Encounter (INDEPENDENT_AMBULATORY_CARE_PROVIDER_SITE_OTHER): Payer: Self-pay

## 2020-09-06 ENCOUNTER — Telehealth (INDEPENDENT_AMBULATORY_CARE_PROVIDER_SITE_OTHER): Payer: Self-pay | Admitting: Internal Medicine

## 2020-09-06 ENCOUNTER — Other Ambulatory Visit (INDEPENDENT_AMBULATORY_CARE_PROVIDER_SITE_OTHER): Payer: Self-pay | Admitting: Gastroenterology

## 2020-09-06 DIAGNOSIS — K8681 Exocrine pancreatic insufficiency: Secondary | ICD-10-CM

## 2020-09-06 NOTE — Progress Notes (Signed)
Please send the order to the patient.  Thanks,  Maylon Peppers, MD Gastroenterology and Hepatology [facility]

## 2020-09-06 NOTE — Telephone Encounter (Signed)
Patient left message stating he received some paperwork in the mail regarding lab work - has some questions - please advise - ph# [phone]

## 2020-09-07 NOTE — Progress Notes (Signed)
Mailed the order form 09/07/2020. CLS

## 2020-09-07 NOTE — Telephone Encounter (Signed)
Doctor Jenetta Downer spoke with the patient on 09/06/2020. CLS

## 2020-09-20 ENCOUNTER — Other Ambulatory Visit: Payer: Self-pay

## 2020-09-20 ENCOUNTER — Ambulatory Visit (HOSPITAL_COMMUNITY)
Admission: RE | Admit: 2020-09-20 | Discharge: 2020-09-20 | Disposition: A | Discharge status: Home / Self Care | Payer: Medicare HMO | Source: Ambulatory Visit | Attending: Gastroenterology | Admitting: Gastroenterology

## 2020-09-20 DIAGNOSIS — R1030 Lower abdominal pain, unspecified: Secondary | ICD-10-CM | POA: Diagnosis present

## 2020-09-20 LAB — POCT I-STAT CREATININE: Creatinine, Ser: 1.1 mg/dL (ref 0.61–1.24)

## 2020-09-20 MED ORDER — IOHEXOL 300 MG/ML  SOLN
100.0000 mL | Freq: Once | INTRAMUSCULAR | Status: AC | PRN
  Administered 2020-09-20: 100 mL via INTRAVENOUS

## 2020-09-22 ENCOUNTER — Telehealth (INDEPENDENT_AMBULATORY_CARE_PROVIDER_SITE_OTHER): Payer: Self-pay | Admitting: Gastroenterology

## 2020-09-22 NOTE — Telephone Encounter (Signed)
I called the patient to inform his about the results of his CT scan which showed pancreatic calcifications compatible with chronic pancreatitis but no presence of ductal dilation or strictures as of now. Will wait for 72 h fecal fat testing which he performed. Also I mentioned a R sided adrenal nodule, which he said was present in a scan last year, but has grown 0.4 cm. I advised him to follow with an endocrinologist regarding this.The patient understood and agreed. Finally, he will see a surgeon to have his bilateral inguinal hernias repaired.  Maylon Peppers, MD Gastroenterology and Hepatology [facility]

## 2020-09-23 LAB — SPECIMEN STATUS REPORT

## 2020-09-23 LAB — FECAL FAT, QUANTITATIVE
Fat,(Fecal Lipids)Qn: 4.2 g/24 hr (ref 0.0–7.1)
Stool Weight: 1197 g

## 2020-09-24 ENCOUNTER — Telehealth (INDEPENDENT_AMBULATORY_CARE_PROVIDER_SITE_OTHER): Payer: Self-pay | Admitting: Gastroenterology

## 2020-09-24 NOTE — Telephone Encounter (Signed)
Spoke with the patient regarding results of fecal fat testing, which was negative for fecal fat testing. Hence he does not need to stay on a fat free diet. I spoke with him about symptoms, he reported that he has presented occasional left lower quadrant abdominal pain but he has not taken the Levsin given the side effect profile.  He is not interested in taking peppermint oil as it may worsen his GERD.  I advised him to start taking a probiotic such as Electronics engineer.  He understood and agreed.  Crystal, no need to call him, patient is aware.  Maylon Peppers, MD Gastroenterology and Hepatology [facility]

## 2020-09-26 NOTE — Telephone Encounter (Signed)
Noted  

## 2020-10-17 HISTORY — PX: HERNIA REPAIR: SHX51

## 2020-11-17 ENCOUNTER — Telehealth (INDEPENDENT_AMBULATORY_CARE_PROVIDER_SITE_OTHER): Payer: Self-pay | Admitting: Gastroenterology

## 2020-11-17 ENCOUNTER — Other Ambulatory Visit (INDEPENDENT_AMBULATORY_CARE_PROVIDER_SITE_OTHER): Payer: Self-pay | Admitting: Gastroenterology

## 2020-11-17 DIAGNOSIS — K219 Gastro-esophageal reflux disease without esophagitis: Secondary | ICD-10-CM

## 2020-11-17 MED ORDER — PANTOPRAZOLE SODIUM 40 MG PO TBEC: 40.0000 mg | DELAYED_RELEASE_TABLET | Freq: Every day | ORAL | 3 refills | Status: DC

## 2020-11-17 NOTE — Telephone Encounter (Signed)
Patient called stated when he last saw Dr Jenetta Downer he still had some pantoprazole 40 mg - states he is about to run out and needs a prescription for 90 day supply faxed to [facility] at [phone] if you need to speak to someone [phone]

## 2020-11-17 NOTE — Telephone Encounter (Signed)
Yes rx sent in correctly. I called and left a detailed message that the medication had been sent in as he requested.

## 2020-11-17 NOTE — Telephone Encounter (Signed)
Medication sent electronically, Crystal can you please confirm this was correctly sent? Thanks

## 2020-12-08 ENCOUNTER — Telehealth (INDEPENDENT_AMBULATORY_CARE_PROVIDER_SITE_OTHER): Payer: Self-pay | Admitting: Gastroenterology

## 2020-12-08 NOTE — Telephone Encounter (Signed)
Patient left voice mail message stating he has some questions about his medication - please advise - ph# [phone]

## 2020-12-08 NOTE — Telephone Encounter (Signed)
I spoke with the patient whom states he had spoke with Dr. Jenetta Downer in regards to an alternative to the Levosin. As it can cause drowsiness. He states Dr. Jenetta Downer suggested # 1 IB Gaurd, but with the IB Guard it can irritate his Jerrye Bushy, He has had issues in the past with Peppermint causing issues with his Jerrye Bushy. He would like to know if he should just ignore this recommendation.   #2 Patient states Dr. Jenetta Downer had recommended Align Probiotic, but these probiotics are expensive and he would like to know if you would order a Atlas Microbiome test to find out which organisms are in his stomach that would suggest a specific probiotic that would be most beneficial for the type of organisms in his stomach. He states he has been doing research on the probiotic and this was suggested as well as being referred to a dietician or nutritionist that specializes in probiotics in IBS patients.

## 2020-12-19 NOTE — Telephone Encounter (Signed)
I called and left a message asked that the patient return call to the office.

## 2020-12-19 NOTE — Telephone Encounter (Signed)
Hi Crystal,  Yes, if he is having significant GERD he can stop taking the IBGard, there is no problem with this. The Atlas Microbiome test is not performed routinely as for IBS the use of probiotics is questionable (the strength of the evidence supporting its use is not compelling). If he wants to have it performed, he would need to order it online as this is not something covered by insurances. I would not order it. Some patients may benefit from using probiotics, specifically the ones that have shown some partial benefit are Align and VSL 3 but they can be expensive. He can try any cheaper over the counter option for a month and see if it helps him,  there is no contraindication for it. If it does not help then he can stop it.  Thanks

## 2020-12-20 NOTE — Telephone Encounter (Signed)
I called and left a vm asked that the patient please return call.

## 2020-12-21 NOTE — Telephone Encounter (Signed)
Patient aware of all. He states he will try the generic Ib gaurd that is coated. He will try a off brand of Probiotic and see if this helps any . He will cut back on coffee and make lifestyle changes based on symptoms.

## 2020-12-22 IMAGING — CT CT ABD-PELV W/ CM
2 of 5 series · 15 of 46 positions shown, 17 images · IV contrast (Omnipaque or Isovue)
Comparison: None.

CLINICAL DATA: Abdominal pain.

EXAM:
CT ABDOMEN AND PELVIS WITH CONTRAST
TECHNIQUE: Multidetector CT imaging of the abdomen and pelvis was performed
using the standard protocol following bolus administration of
intravenous contrast.
CONTRAST:  100mL OMNIPAQUE IOHEXOL 300 MG/ML  SOLN

[Series 2: axial st · axial · 0.73mm/px · z∈[+893,+1313]mm · 12 of 96 slices shown, 14 images]
[im 6/96  soft-tissue]
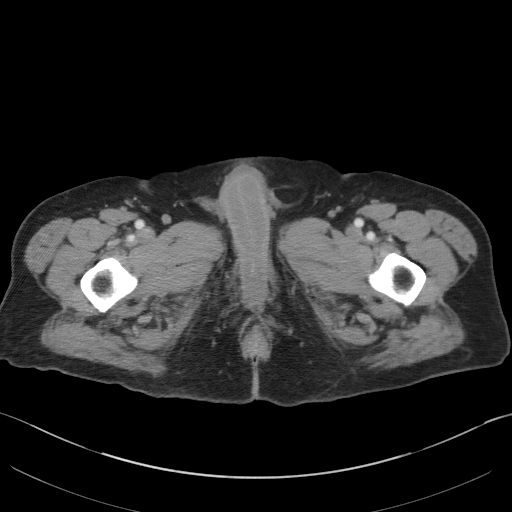
[im 6/96  bone]
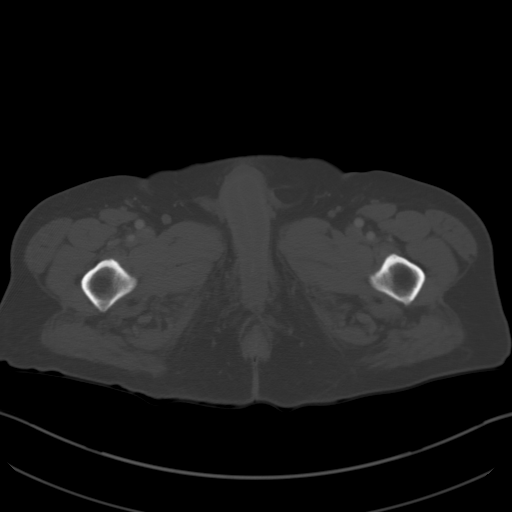
[im 16/96  soft-tissue]
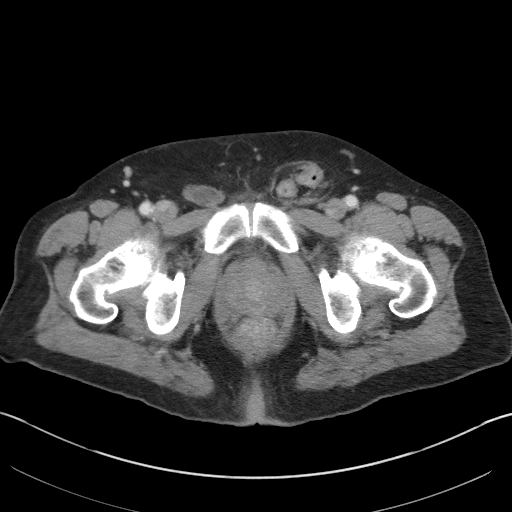
[im 22/96  soft-tissue]
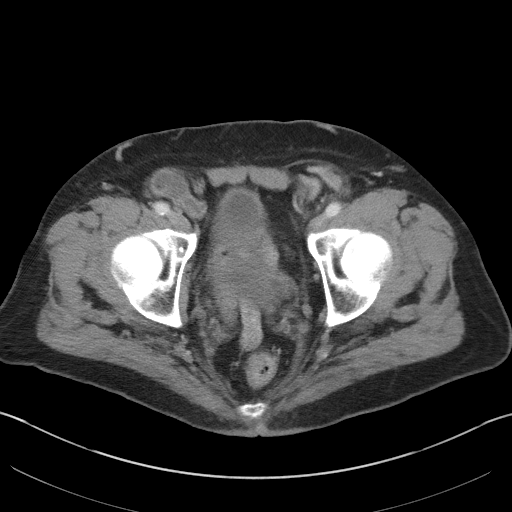
[im 27/96  soft-tissue]
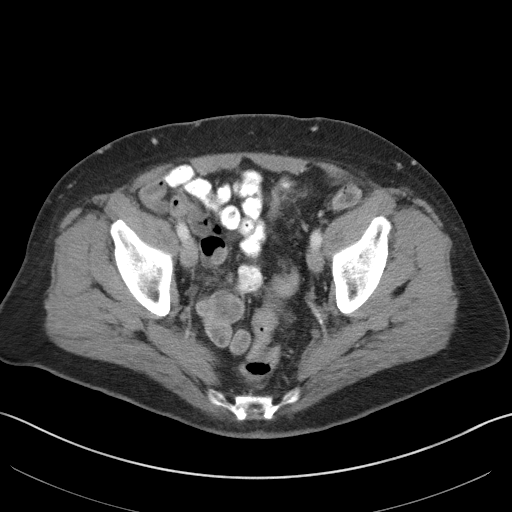
[im 37/96  soft-tissue]
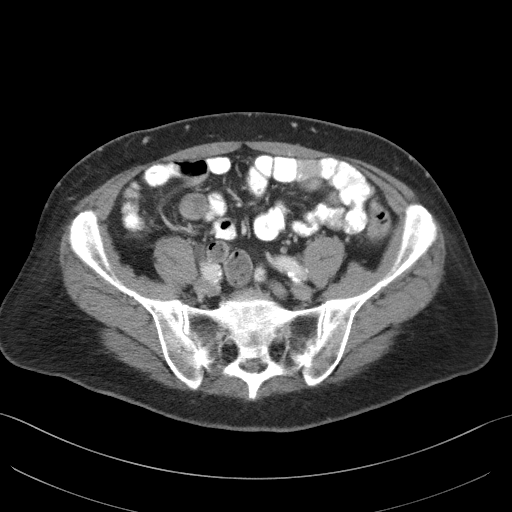
[im 43/96  soft-tissue]
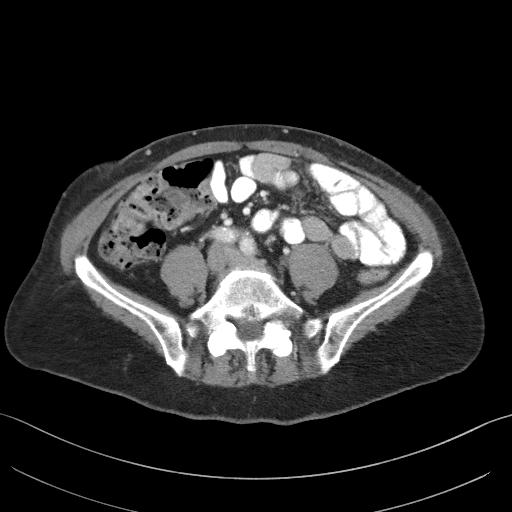
[im 53/96  soft-tissue]
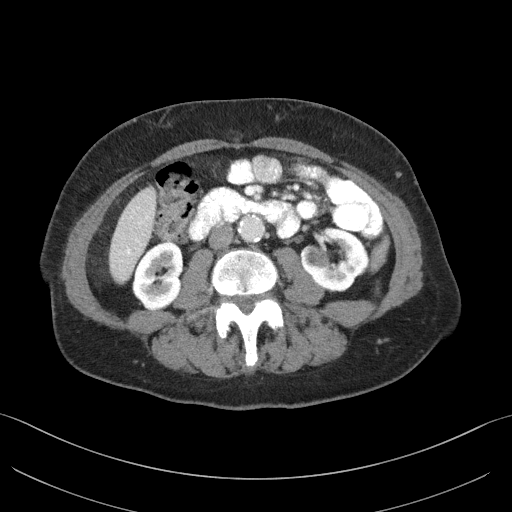
[im 59/96  soft-tissue]
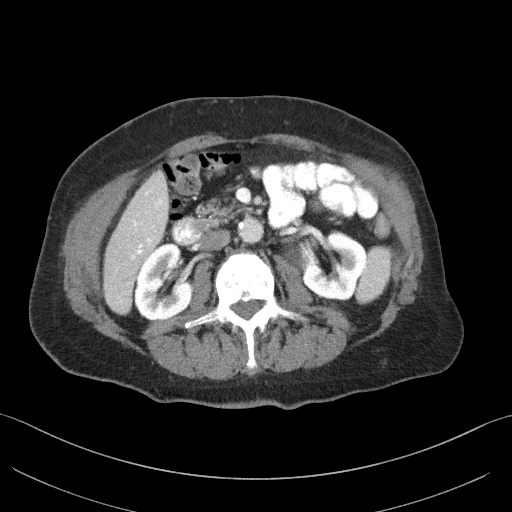
[im 69/96  soft-tissue]
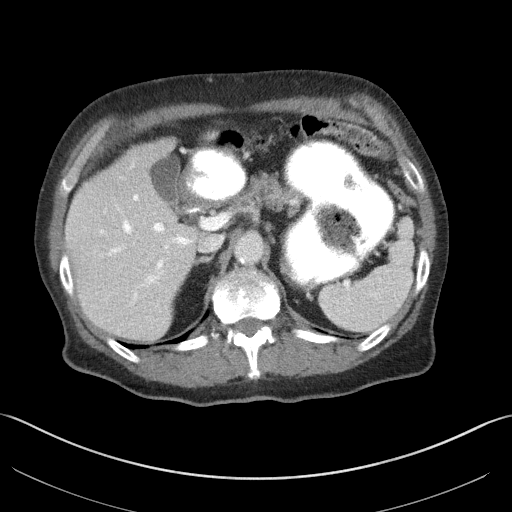
[im 69/96  bone]
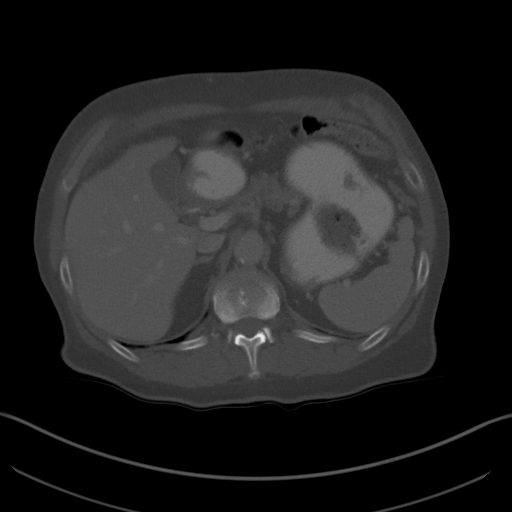
[im 74/96  soft-tissue]
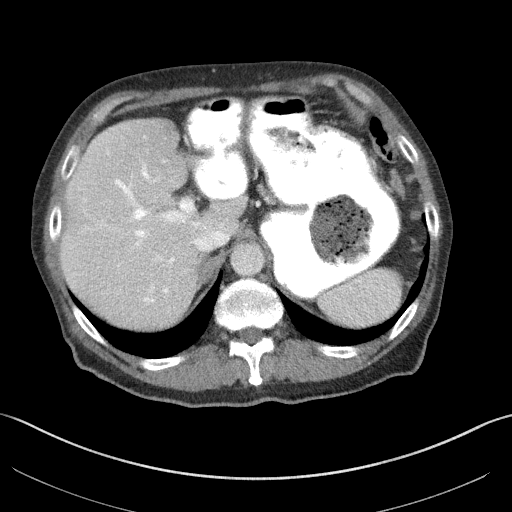
[im 80/96  soft-tissue]
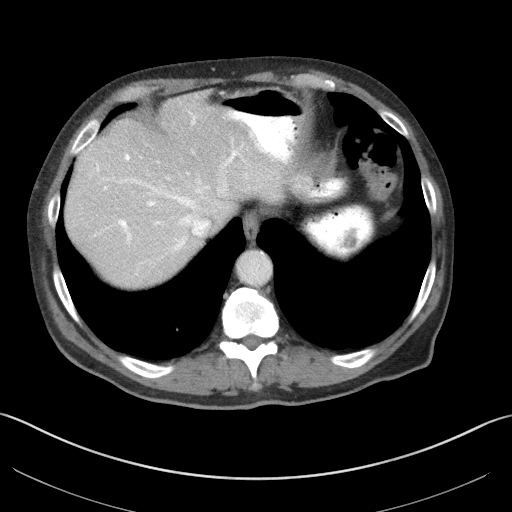
[im 90/96  soft-tissue]
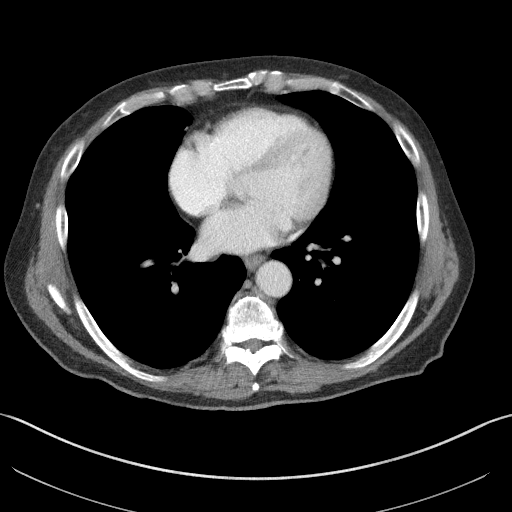

[Series 6: coronal st · coronal · 0.66mm/px · 3 of 97 slices shown]
[im 33/97  soft-tissue]
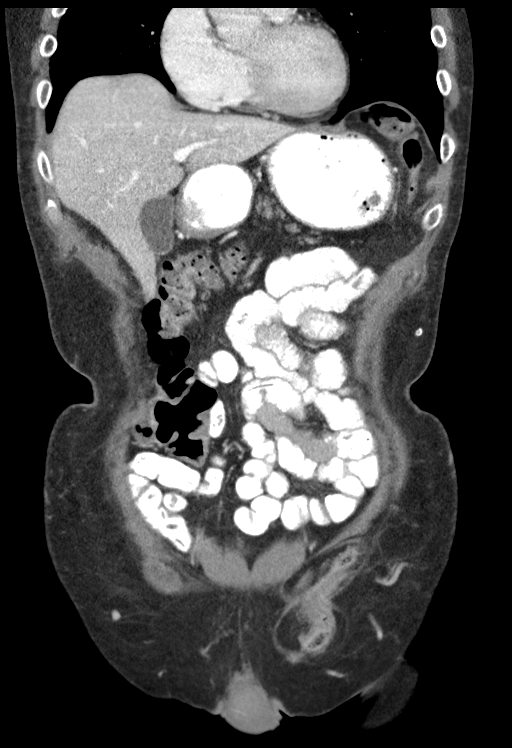
[im 43/97  soft-tissue]
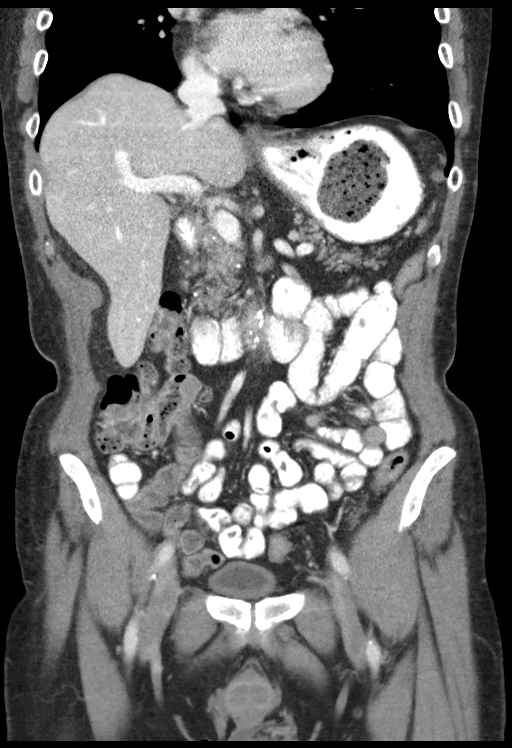
[im 54/97  soft-tissue]
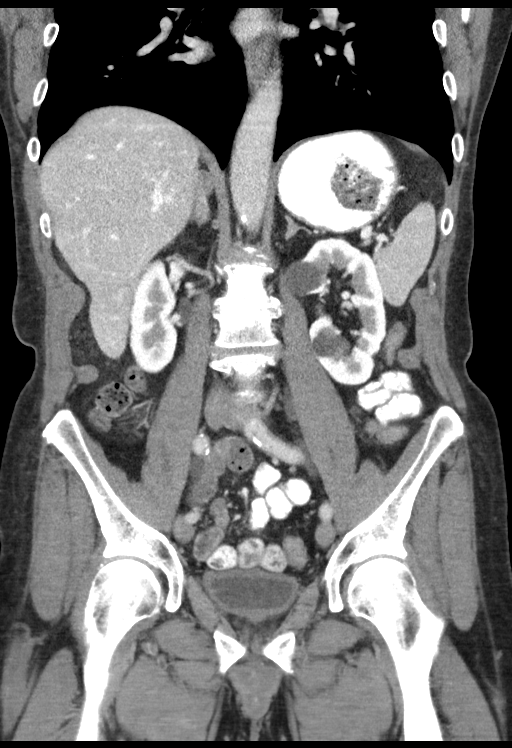

[15 of 46 positions shown; findings below may reference images not displayed]

FINDINGS: Lower chest: The lung bases are clear. The heart size is normal.

Hepatobiliary: The liver is normal. Normal gallbladder.There is no
biliary ductal dilation.

Pancreas: There are calcifications at the head of the pancreas.

Spleen: Unremarkable.

Adrenals/Urinary Tract:

--Adrenal glands: There is a right adrenal nodule measuring
approximately 2.2 cm and measuring 65 Hounsfield units.

--Right kidney/ureter: No hydronephrosis or radiopaque kidney
stones.

--Left kidney/ureter: No hydronephrosis or radiopaque kidney stones.

--Urinary bladder: Unremarkable.

Stomach/Bowel:

--Stomach/Duodenum: The stomach is distended.

--Small bowel: There is a small bowel containing right inguinal
hernia without evidence for obstruction.

--Colon: There is a left inguinal hernia containing a loop of the
sigmoid colon without evidence for obstruction.

--Appendix: Normal.

Vascular/Lymphatic: Atherosclerotic calcification is present within
the non-aneurysmal abdominal aorta, without hemodynamically
significant stenosis.

--No retroperitoneal lymphadenopathy.

--No mesenteric lymphadenopathy.

--No pelvic or inguinal lymphadenopathy.

Reproductive: The prostate gland is enlarged.

Other: No ascites or free air. The abdominal wall is normal.

Musculoskeletal. There is a posterior disc osteophyte complex at the
L3-L4 level in addition to anterolisthesis of L3 on L4 resulting in
at least moderate grade spinal canal stenosis.
IMPRESSION: 1. No acute abdominopelvic abnormality.
2. Bowel containing bilateral inguinal hernias as detailed above
without evidence for an obstruction.
3. Indeterminate right adrenal nodule measuring up to 2.2 cm.
Follow-up with a nonemergent adrenal protocol CT is recommended.
Alternatively, comparison to outside prior studies would be useful
if available.

Aortic Atherosclerosis ([id number]).

## 2020-12-26 ENCOUNTER — Telehealth (INDEPENDENT_AMBULATORY_CARE_PROVIDER_SITE_OTHER): Payer: Self-pay

## 2020-12-26 NOTE — Telephone Encounter (Signed)
I had a thorough discussion with the patient regarding the lack of evidence behind tailoring his microbiome with Atlas microbiome test.  I explained to him that the evidence behind probiotics for IBS was questionable but some patients may feel improvement of their symptoms with their intake.  However the patient was hesitant to start any probiotic if this test was not performed before.  I told him I was not going to order this test given the lack of evidence behind it and the fact that I will need to request preapproval process for it without any strong scientific background to support.  The patient was upset about this. He requested my opinion about about gut directed hypnotherapy for treatment of IBS.  I told him that I was not aware of such therapy and I did not know who to refer him to but it will not cause any harm to try it if he can obtain that sort of specialist. Finally, the patient questioned the use of antispasmodic medication.  I told him that currently the ACG does not recommend the use of these medications although some patients with functional disorders may feel improvement with it (this medication was previously recommended by this association). Finally, he stated that he was trying enteric-coated peppermint oil without improvement of his symptoms for the last week. He may need to follow-up in clinic to discuss the use of TCAs given refractoriness of symptoms.  Maylon Peppers, MD Gastroenterology and Hepatology [facility]

## 2020-12-26 NOTE — Telephone Encounter (Signed)
Patient states he has would like [place] to call his provider line on insurance card [place] provider #  [phone] to see if his insurance will cover the Atlas microbiome test. I advised that we could write a prescription for this. He states no he wants [place] to check with insurance coverage.  He also wants to know if you know of a dietician whom has knowledge of IBS and probiotic use. He does not want to start these with out having a person whom is knowledgeable in this field.   He also wants to know what your thoughts on Roslyn Gut directed Hypnotherapy are.  He does not want to start a FODMAP diet as this just treats the symptoms and not the cause of IBS, and he states he has a limited list of things he is able to eat already and this would decrease what he is able to eat.  He also states that the [facility] states the Probitics are questionable in use. And if so why are you suggesting the use of them.  I advised  That he may need another appointment here to discuss all these issues.

## 2021-02-24 ENCOUNTER — Other Ambulatory Visit: Payer: Self-pay | Admitting: Family Medicine

## 2021-02-24 DIAGNOSIS — K4020 Bilateral inguinal hernia, without obstruction or gangrene, not specified as recurrent: Secondary | ICD-10-CM

## 2021-02-24 NOTE — Progress Notes (Signed)
refer

## 2021-03-09 ENCOUNTER — Ambulatory Visit: Payer: Medicare HMO | Admitting: General Surgery

## 2021-03-09 ENCOUNTER — Encounter: Payer: Self-pay | Admitting: General Surgery

## 2021-03-09 ENCOUNTER — Other Ambulatory Visit: Payer: Self-pay

## 2021-03-09 VITALS — BP 154/91 | HR 62 | Temp 97.6°F | Resp 16 | Ht 66.0 in | Wt 144.0 lb

## 2021-03-09 DIAGNOSIS — K4091 Unilateral inguinal hernia, without obstruction or gangrene, recurrent: Secondary | ICD-10-CM | POA: Diagnosis not present

## 2021-03-09 NOTE — Progress Notes (Signed)
[facility] History and Physical  Reason for Referral: Recurrent inguinal hernia  Referring Physician:  Dolores Patty, DO    Chief Complaint    Hernia      Ronnie Patrick is a 70 y.o. male.  HPI: Ronnie Patrick is a 70 yo who had a laparoscopic bilateral inguinal hernia repair done in [place] with Dr. Jennings Books in November 2021. He says that after the repair he followed all of his post operative instructions and even boarded his cats for 80+ days so that he would not lift over the recommendation. He says that in February/ March he started to notice bulges returning and pain in the groin region.  He says he did not want to return to [place]. He is currently trying to find someone to redo a laparoscopic surgery for him.   He says that he has pain in the bilateral groin and bulges daily. He says he only gets relief with ice and lying flat.  He denies any obstructive symptoms.   Past Medical History:  Diagnosis Date  . Basal cell carcinoma (BCC)    skin cancer on scalp-been removed  . Chronic pancreatitis ([place])   . Exocrine pancreatic insufficiency   . Gastritis   . GERD (gastroesophageal reflux disease)   . Hiatal hernia   . HTN (hypertension)   . Hyperlipidemia   . Jock itch    LAMISIL/TERBINAFINE REQUIRED  . Lyme disease JUN 2016    Past Surgical History:  Procedure Laterality Date  . BIOPSY  02/20/2017   Procedure: BIOPSY;  Surgeon: Danie Binder, MD;  [facility];  Service: Endoscopy;;  gastric and esophageal  . BIOPSY  04/13/2019   Procedure: BIOPSY;  Surgeon: Danie Binder, MD;  [facility];  Service: Endoscopy;;  colon  . COLONOSCOPY  DEC 2015 [facility]   [facility]  . COLONOSCOPY WITH PROPOFOL N/A 04/13/2019   Procedure: COLONOSCOPY WITH PROPOFOL;  Surgeon: Danie Binder, MD;  [facility];  Service: Endoscopy;  Laterality: N/A;  8:30am  . ESOPHAGOGASTRODUODENOSCOPY N/A 02/20/2017   Procedure:  ESOPHAGOGASTRODUODENOSCOPY (EGD);  Surgeon: Danie Binder, MD;  [facility];  Service: Endoscopy;  Laterality: N/A;  215  . EXCISIONAL HEMORRHOIDECTOMY     AGE 35-PAINFUL  . FLEXIBLE SIGMOIDOSCOPY N/A 07/15/2015   Procedure: FLEXIBLE SIGMOIDOSCOPY;  Surgeon: Danie Binder, MD;  [facility];  Service: Endoscopy;  Laterality: N/A;  100 - moved to 12:00 - office to notify  . HEMORRHOID BANDING N/A 07/15/2015   Procedure: HEMORRHOID BANDING;  Surgeon: Danie Binder, MD;  [facility];  Service: Endoscopy;  Laterality: N/A;  . POLYPECTOMY  04/13/2019   Procedure: POLYPECTOMY;  Surgeon: Danie Binder, MD;  [facility];  Service: Endoscopy;;  colon  . TONSILLECTOMY AND ADENOIDECTOMY     AS A CHILD  . UPPER GASTROINTESTINAL ENDOSCOPY  DEC 2015 [facility]   GERD, HH    Family History  Problem Relation Age of Onset  . Allergic rhinitis Mother   . Eczema Mother   . Urticaria Mother   . Asthma Father   . Colon cancer Neg Hx   . Colon polyps Neg Hx     Social History   Tobacco Use  . Smoking status: Former Smoker    Packs/day: 1.00    Years: 34.00    Pack years: 34.00    Quit date: 07/14/2001    Years since quitting: 19.6  .  Smokeless tobacco: Never Used  . Tobacco comment: Quit in 2002  Vaping Use  . Vaping Use: Never used  Substance Use Topics  . Alcohol use: No    Alcohol/week: 0.0 standard drinks  . Drug use: No    Medications: I have reviewed the patient's current medications. Allergies as of 03/09/2021      Reactions   Bacitracin Hives   Flagyl [metronidazole] Itching   Neosporin [neomycin-bacitracin Zn-polymyx] Hives   Penicillins Itching   Has patient had a PCN reaction causing immediate rash, facial/tongue/throat swelling, SOB or lightheadedness with hypotension: No Has patient had a PCN reaction causing severe rash involving mucus membranes or skin necrosis: No Has patient had a PCN reaction that required hospitalization  No Has patient had a PCN reaction occurring within the last 10 years: No If all of the above answers are "NO", then may proceed with Cephalosporin use.      Medication List       Accurate as of March 09, 2021  2:25 PM. If you have any questions, ask your nurse or doctor.        STOP taking these medications   Aloe Vera Gel Stopped by: Virl Cagey, MD   hyoscyamine 0.125 MG SL tablet Commonly known as: LEVSIN SL Stopped by: Virl Cagey, MD     TAKE these medications   acetaminophen 500 MG tablet Commonly known as: TYLENOL Take 500 mg by mouth 2 (two) times daily.   acyclovir 400 MG tablet Commonly known as: ZOVIRAX Take 400 mg by mouth 2 (two) times daily. What changed: Another medication with the same name was removed. Continue taking this medication, and follow the directions you see here. Changed by: Virl Cagey, MD   atorvastatin 40 MG tablet Commonly known as: LIPITOR Take 40 mg by mouth daily.   calcium carbonate 750 MG chewable tablet Commonly known as: TUMS EX Chew 1 tablet by mouth 3 (three) times daily as needed for heartburn.   camphor-menthol lotion Commonly known as: SARNA Apply 1 application topically as needed for itching.   fexofenadine 180 MG tablet Commonly known as: ALLEGRA Take 180 mg by mouth 2 (two) times daily as needed (allergies.).   fluticasone 50 MCG/ACT nasal spray Commonly known as: FLONASE Place 1 spray into both nostrils 2 (two) times daily.   Glycerin-Hypromellose-PEG 400 0.2-0.2-1 % Soln Place 1 drop into both eyes 3 (three) times daily as needed (for dry/irritated eyes.). Visine Dry Eye Relief   lidocaine 2 % solution Commonly known as: XYLOCAINE Use as directed 15 mLs in the mouth or throat 2 (two) times daily as needed (gerd/chest pain.).   meloxicam 15 MG tablet Commonly known as: MOBIC Take 15 mg by mouth daily.   metoprolol tartrate 25 MG tablet Commonly known as: LOPRESSOR Take 25 mg by mouth 2  (two) times daily.   multivitamin with minerals Tabs tablet Take 1 tablet by mouth daily.   pantoprazole 40 MG tablet Commonly known as: PROTONIX Take 1 tablet (40 mg total) by mouth daily before breakfast.   polyethylene glycol 17 g packet Commonly known as: MIRALAX / GLYCOLAX Take 17 g by mouth daily.   sildenafil 100 MG tablet Commonly known as: VIAGRA Take 100 mg by mouth daily as needed for erectile dysfunction.   sodium chloride 0.65 % Soln nasal spray Commonly known as: OCEAN Place 1 spray into both nostrils as needed for congestion.   Ventolin HFA 108 (90 Base) MCG/ACT inhaler Generic drug: albuterol Inhale  2 puffs into the lungs every 4 (four) hours as needed for wheezing or shortness of breath.        ROS:  A comprehensive review of systems was negative except for: Respiratory: positive for wheezing Gastrointestinal: positive for abdominal pain, nausea, reflux symptoms and chronic pancreatitis, IBS Musculoskeletal: positive for back pain and joint pain Endocrine: positive for hot and cold intolerances, tired sluggish  Blood pressure (!) 154/91, pulse 62, temperature 97.6 F (36.4 C), temperature source Other (Comment), resp. rate 16, height 5\' 6"  (1.676 m), weight 144 lb (65.3 kg), SpO2 98 %. Physical Exam Vitals reviewed.  Constitutional:      Appearance: Normal appearance.  HENT:     Head: Normocephalic.     Nose: Nose normal.  Eyes:     Extraocular Movements: Extraocular movements intact.  Cardiovascular:     Rate and Rhythm: Normal rate and regular rhythm.  Pulmonary:     Effort: Pulmonary effort is normal.     Breath sounds: Normal breath sounds.  Abdominal:     General: There is no distension.     Palpations: Abdomen is soft.     Tenderness: There is no abdominal tenderness.     Hernia: A hernia is present. Hernia is present in the right inguinal area.     Comments: Reducible right inguinal hernia, no obvious hernia on the left, pain with  palpation of both  Musculoskeletal:        General: No swelling. Normal range of motion.  Skin:    General: Skin is warm.  Neurological:     General: No focal deficit present.     Mental Status: He is alert and oriented to person, place, and time.  Psychiatric:        Mood and Affect: Mood normal.        Behavior: Behavior normal.        Thought Content: Thought content normal.        Judgment: Judgment normal.     Results: None   Assessment & Plan:  Ronnie Patrick is a 70 y.o. male with a recurrent right inguinal hernia but nothing obvious on the left. Discussed with him that I do not do laparoscopic inguinal hernia and that many people would argue that after a laparoscopic repair that fails that you should do an open repair. Discussed the option repair. Discussed that he is already planning on seeing Dr. Rocco Serene in [place] to see if she would do a laparoscopic repair. Discussed option of CT prior to any repair to see if there is anything going on in the left which I do not appreciate on exam.   Patient will let us know what he decides.  Discussed the risk and benefits including, bleeding, infection, use of mesh, risk of recurrence, risk of nerve damage causing numbness or changes in sensation, risk of damage to the cord structures. The patient understands the risk and benefits of repair with mesh, and has decided to proceed.  We also discussed open versus laparoscopic surgery and the use of mesh. We discussed that I do open repairs with mesh, and that this is considered equivalent to laparoscopic surgery. We discussed reasons for opting for laparoscopic surgery including if a bilateral repair is needed or if a patient has a recurrence after an open repair.    Virl Cagey 03/09/2021, 2:25 PM

## 2021-03-09 NOTE — Patient Instructions (Addendum)
Dr. Nolon Stalls. Kinsinger, Dr. Redmond Pulling, Dr. Emogene Morgan, I am pretty do Laparoscopic Inguinal Repairs for [facility]. They have others that probably do also.   Open Hernia Repair, Adult Open hernia repair is a surgical procedure to fix a hernia. A hernia occurs when an internal organ or tissue pushes through a weak spot in the muscles along the wall of the abdomen. Hernias commonly occur in the groin and around the belly button. Most hernias tend to get worse over time. Often, surgery is done to prevent the hernia from becoming bigger, uncomfortable, or an emergency. Emergency surgery may be needed if contents of the abdomen get stuck in the opening (incarcerated hernia) or if the blood supply gets cut off (strangulated hernia). In an open repair, an incision is made in the abdomen to perform the surgery. Tell a health care provider about:  Any allergies you have.  All medicines you are taking, including vitamins, herbs, eye drops, creams, and over-the-counter medicines.  Any problems you or family members have had with anesthetic medicines.  Any blood or bone disorders you have.  Any surgeries you have had.  Any medical conditions you have, including any recent cold or flu (influenza)symptoms.  Whether you are pregnant or may be pregnant. What are the risks? Generally, this is a safe procedure. However, problems may occur, including:  Long-lasting (chronic) pain.  Bleeding.  Infection.  Damage to the testicles. This can cause shrinking or swelling.  Damage to nearby structures or organs, including the bladder, blood vessels, intestines, or nerves near the hernia.  Blood clots.  Trouble passing urine.  Return of the hernia. What happens before the procedure? Staying hydrated Follow instructions from your health care provider about hydration, which may include:  Up to 2 hours before the procedure - you may continue to drink clear liquids, such as water, clear fruit juice,  black coffee, and plain tea.   Eating and drinking restrictions Follow instructions from your health care provider about eating and drinking, which may include:  8 hours before the procedure - stop eating heavy meals or foods, such as meat, fried foods, or fatty foods.  6 hours before the procedure - stop eating light meals or foods, such as toast or cereal.  6 hours before the procedure - stop drinking milk or drinks that contain milk.  2 hours before the procedure - stop drinking clear liquids. Medicines Ask your health care provider about:  Changing or stopping your regular medicines. This is especially important if you are taking diabetes medicines or blood thinners.  Taking medicines such as aspirin and ibuprofen. These medicines can thin your blood. Do not take these medicines unless your health care provider tells you to take them.  Taking over-the-counter medicines, vitamins, herbs, and supplements. Surgery safety Ask your health care provider:  How your surgery site will be marked.  What steps will be taken to help prevent infection. These steps may include: ? Removing hair at the surgery site. ? Washing skin with a germ-killing soap. ? Receiving antibiotic medicine. General instructions  You may have an exam or testing, such as blood tests or imaging studies.  Do not use any products that contain nicotine or tobacco for at least 4 weeks before the procedure. These products include cigarettes, chewing tobacco, and vaping devices, such as e-cigarettes. If you need help quitting, ask your health care provider.  Let your health care provider know if you develop a cold or any infection before your surgery. If you get  an infection before surgery, you may receive antibiotics to treat it.  Plan to have a responsible adult take you home from the hospital or clinic.  If you will be going home right after the procedure, plan to have a responsible adult care for you for the time  you are told. This is important. What happens during the procedure?  An IV will be inserted into one of your veins.  You will be given one or more of the following: ? A medicine to help you relax (sedative). ? A medicine to numb the area (local anesthetic). ? A medicine to make you fall asleep (general anesthetic).  Your surgeon will make an incision over the hernia.  The tissues of the hernia will be moved back into place.  The edges of the hernia may be stitched (sutured) together.  The opening in the abdominal muscles will be closed with stitches (sutures). Or, your surgeon will place a mesh patch made of artificial (synthetic) material over the opening.  The incision will be closed with sutures, skin glue, or adhesive strips.  A bandage (dressing) may be placed over the incision. The procedure may vary among health care providers and hospitals.   What happens after the procedure?  Your blood pressure, heart rate, breathing rate, and blood oxygen level will be monitored until you leave the hospital or clinic.  You may be given medicine for pain.  If you were given a sedative during the procedure, it can affect you for several hours. Do not drive or operate machinery until your health care provider says that it is safe. Summary  Open hernia repair is a surgical procedure to fix a hernia. Hernias commonly occur in the groin and around the belly button.  Emergency surgery may be needed if contents of the abdomen get stuck in the opening (incarcerated hernia) or if the blood supply gets cut off (strangulated hernia).  In this procedure, an incision is made in the abdomen to perform the surgery.  After the procedure, you may be given medicine for pain. This information is not intended to replace advice given to you by your health care provider. Make sure you discuss any questions you have with your health care provider. Document Revised: 07/18/2020 Document Reviewed:  07/18/2020 Elsevier Patient Education  2021 [employer].

## 2021-04-18 ENCOUNTER — Emergency Department (HOSPITAL_COMMUNITY)
Admission: EM | Admit: 2021-04-18 | Discharge: 2021-04-18 | Disposition: A | Discharge status: Home / Self Care | Payer: Medicare HMO | Attending: Emergency Medicine | Admitting: Emergency Medicine

## 2021-04-18 ENCOUNTER — Other Ambulatory Visit: Payer: Self-pay

## 2021-04-18 ENCOUNTER — Encounter (HOSPITAL_COMMUNITY): Payer: Self-pay | Admitting: *Deleted

## 2021-04-18 DIAGNOSIS — I10 Essential (primary) hypertension: Secondary | ICD-10-CM | POA: Insufficient documentation

## 2021-04-18 DIAGNOSIS — Z87891 Personal history of nicotine dependence: Secondary | ICD-10-CM | POA: Diagnosis not present

## 2021-04-18 DIAGNOSIS — R1030 Lower abdominal pain, unspecified: Secondary | ICD-10-CM | POA: Insufficient documentation

## 2021-04-18 DIAGNOSIS — Z79899 Other long term (current) drug therapy: Secondary | ICD-10-CM | POA: Insufficient documentation

## 2021-04-18 DIAGNOSIS — K219 Gastro-esophageal reflux disease without esophagitis: Secondary | ICD-10-CM | POA: Insufficient documentation

## 2021-04-18 DIAGNOSIS — K4090 Unilateral inguinal hernia, without obstruction or gangrene, not specified as recurrent: Secondary | ICD-10-CM

## 2021-04-18 MED ORDER — TRAMADOL HCL 50 MG PO TABS: 50.0000 mg | ORAL_TABLET | Freq: Four times a day (QID) | ORAL | 0 refills | Status: DC | PRN

## 2021-04-18 NOTE — ED Triage Notes (Signed)
States he has history of inguinal hernia, states he had surgery previously, states over the past week pain has increased

## 2021-04-18 NOTE — Discharge Instructions (Addendum)
It was our pleasure to provide your ER care today - we hope that you feel better.  We discussed your case with Dr Constance Haw - she indicates for now it looks like 5/9 is the closest opening for elective repair of hernia.  She indicates if symptoms worsening, or hernia persistently out and painful to contact office.   You may take acetaminophen or ibuprofen for pain. You may also take ultram as need for pain - no driving when taking.   Return to ER if worse, new symptoms, fevers, hernia persistent out/painful/not reducible, persistent vomiting, or other concern.

## 2021-04-18 NOTE — ED Provider Notes (Signed)
[facility] EMERGENCY DEPARTMENT Provider Note   CSN: [id number] Arrival date & time: 04/18/21  1608     History No chief complaint on file.   Ronnie Patrick is a 70 y.o. male.  Patient c/o bilateral inguinal pain ever since a couple months after his prior laparoscopic inguinal hernia repair last fall. States he has been looking for someone to re-do it laparoscopically but couldn't find anyone, so now is scheduled to have open IHR next Monday/Dr Bridges. Indicates he has chronic/persistent pain to bil inguinal area, moderate, dull, non radiating. No abd distension or nausea/vomiting. Is having normal bms, including today. No skin changes, redness, or persistent swelling. No gu c/o. No back/flank pain. No fever or chills.   The history is provided by the patient.       Past Medical History:  Diagnosis Date  . Basal cell carcinoma (BCC)    skin cancer on scalp-been removed  . Chronic pancreatitis ([place])   . Exocrine pancreatic insufficiency   . Gastritis   . GERD (gastroesophageal reflux disease)   . Hiatal hernia   . HTN (hypertension)   . Hyperlipidemia   . Jock itch    LAMISIL/TERBINAFINE REQUIRED  . Lyme disease JUN 2016    Patient Active Problem List   Diagnosis Date Noted  . Recurrent right inguinal hernia 03/09/2021  . Abdominal pain 08/25/2020  . Inguinal hernia 08/25/2020  . IBS (irritable bowel syndrome) 08/25/2020  . Polyp of ascending colon   . Chronic diarrhea 02/16/2019  . Lactose intolerance 05/06/2018  . Irregular bowel habits 05/09/2017  . Dysphagia   . GERD (gastroesophageal reflux disease) 11/07/2016  . Rectal discharge   . Hemorrhoids, external without complications 53/66/4403    Past Surgical History:  Procedure Laterality Date  . BIOPSY  02/20/2017   Procedure: BIOPSY;  Surgeon: Danie Binder, MD;  Location: [facility];  Service: Endoscopy;;  gastric and esophageal  . BIOPSY  04/13/2019   Procedure: BIOPSY;  Surgeon: Danie Binder, MD;   Location: [facility];  Service: Endoscopy;;  colon  . COLONOSCOPY  DEC 2015 [facility]   [facility]  . COLONOSCOPY WITH PROPOFOL N/A 04/13/2019   Procedure: COLONOSCOPY WITH PROPOFOL;  Surgeon: Danie Binder, MD;  Location: [facility];  Service: Endoscopy;  Laterality: N/A;  8:30am  . ESOPHAGOGASTRODUODENOSCOPY N/A 02/20/2017   Procedure: ESOPHAGOGASTRODUODENOSCOPY (EGD);  Surgeon: Danie Binder, MD;  Location: [facility];  Service: Endoscopy;  Laterality: N/A;  215  . EXCISIONAL HEMORRHOIDECTOMY     AGE 72-PAINFUL  . FLEXIBLE SIGMOIDOSCOPY N/A 07/15/2015   Procedure: FLEXIBLE SIGMOIDOSCOPY;  Surgeon: Danie Binder, MD;  Location: [facility];  Service: Endoscopy;  Laterality: N/A;  100 - moved to 12:00 - office to notify  . HEMORRHOID BANDING N/A 07/15/2015   Procedure: HEMORRHOID BANDING;  Surgeon: Danie Binder, MD;  Location: [facility];  Service: Endoscopy;  Laterality: N/A;  . POLYPECTOMY  04/13/2019   Procedure: POLYPECTOMY;  Surgeon: Danie Binder, MD;  Location: [facility];  Service: Endoscopy;;  colon  . TONSILLECTOMY AND ADENOIDECTOMY     AS A CHILD  . UPPER GASTROINTESTINAL ENDOSCOPY  DEC 2015 [facility]   GERD, HH       Family History  Problem Relation Age of Onset  . Allergic rhinitis Mother   . Eczema Mother   . Urticaria Mother   . Asthma Father   . Colon cancer Neg Hx   . Colon polyps Neg Hx  Social History   Tobacco Use  . Smoking status: Former Smoker    Packs/day: 1.00    Years: 34.00    Pack years: 34.00    Quit date: 07/14/2001    Years since quitting: 19.7  . Smokeless tobacco: Never Used  . Tobacco comment: Quit in 2002  Vaping Use  . Vaping Use: Never used  Substance Use Topics  . Alcohol use: No    Alcohol/week: 0.0 standard drinks  . Drug use: No    Home Medications Prior to Admission medications   Medication Sig Start Date End Date Taking? Authorizing Provider  acetaminophen (TYLENOL) 500 MG tablet Take 500 mg by mouth 2 (two)  times daily.    [provider]  acyclovir (ZOVIRAX) 400 MG tablet Take 400 mg by mouth 2 (two) times daily.    [provider]  atorvastatin (LIPITOR) 40 MG tablet Take 40 mg by mouth daily. 02/07/21   [provider]  calcium carbonate (TUMS EX) 750 MG chewable tablet Chew 1 tablet by mouth 3 (three) times daily as needed for heartburn.    [provider]  camphor-menthol Timoteo Ace) lotion Apply 1 application topically as needed for itching.    [provider]  fexofenadine (ALLEGRA) 180 MG tablet Take 180 mg by mouth 2 (two) times daily as needed (allergies.).    [provider]  fluticasone (FLONASE) 50 MCG/ACT nasal spray Place 1 spray into both nostrils 2 (two) times daily.    [provider]  Glycerin-Hypromellose-PEG 400 0.2-0.2-1 % SOLN Place 1 drop into both eyes 3 (three) times daily as needed (for dry/irritated eyes.). [facility] Dry Eye Relief    [provider]  lidocaine (XYLOCAINE) 2 % solution Use as directed 15 mLs in the mouth or throat 2 (two) times daily as needed (gerd/chest pain.).     [provider]  meloxicam (MOBIC) 15 MG tablet Take 15 mg by mouth daily.    [provider]  metoprolol tartrate (LOPRESSOR) 25 MG tablet Take 25 mg by mouth 2 (two) times daily. 03/15/19   [provider]  Multiple Vitamin (MULTIVITAMIN WITH MINERALS) TABS tablet Take 1 tablet by mouth daily.    [provider]  pantoprazole (PROTONIX) 40 MG tablet Take 1 tablet (40 mg total) by mouth daily before breakfast. 11/17/20   Montez Morita, Quillian Quince, MD  polyethylene glycol (MIRALAX / GLYCOLAX) 17 g packet Take 17 g by mouth daily.    [provider]  sildenafil (VIAGRA) 100 MG tablet Take 100 mg by mouth daily as needed for erectile dysfunction.    [provider]  sodium chloride (OCEAN) 0.65 % SOLN nasal spray Place 1 spray into both nostrils as needed for congestion.    [provider]  VENTOLIN HFA 108 (90 BASE) MCG/ACT inhaler Inhale 2 puffs into the lungs every 4 (four) hours as needed for wheezing or shortness of breath.  04/28/15   [provider]    Allergies    Bacitracin, Flagyl [metronidazole], Neosporin [neomycin-bacitracin zn-polymyx], and Penicillins  Review of Systems   Review of Systems  Constitutional: Negative for fever.  Respiratory: Negative for shortness of breath.   Cardiovascular: Negative for chest pain.  Gastrointestinal: Negative for abdominal pain and vomiting.  Genitourinary: Negative for dysuria and flank pain.  Musculoskeletal: Negative for back pain.  Skin: Negative for rash.  Hematological:       No anticoag use    Physical Exam Updated Vital Signs BP (!) 143/81  Pulse 69   Temp 98.6 F (37 C) (Oral)   Resp 18   SpO2 99%   Physical Exam Vitals and nursing note reviewed.  Constitutional:      Appearance: Normal appearance. He is well-developed.  HENT:     Head: Atraumatic.     Nose: Nose normal.     Mouth/Throat:     Mouth: Mucous membranes are moist.  Eyes:     General: No scleral icterus.    Conjunctiva/sclera: Conjunctivae normal.  Neck:     Trachea: No tracheal deviation.  Cardiovascular:     Rate and Rhythm: Normal rate.     Pulses: Normal pulses.  Pulmonary:     Effort: Pulmonary effort is normal. No accessory muscle usage or respiratory distress.  Abdominal:     General: Bowel sounds are normal. There is no distension.     Palpations: Abdomen is soft. There is no mass.     Tenderness: There is no abdominal tenderness. There is no guarding or rebound.     Comments: No incarcerated hernia noted.   Genitourinary:    Comments: No cva tenderness. Musculoskeletal:        General: No swelling.     Cervical back: Neck supple.  Skin:    General: Skin is warm and dry.     Findings: No rash.  Neurological:     Mental Status: He is alert.     Comments: Alert, speech clear.   Psychiatric:         Mood and Affect: Mood normal.     ED Results / Procedures / Treatments   Labs (all labs ordered are listed, but only abnormal results are displayed) Labs Reviewed - No data to display  EKG None  Radiology No results found.  Procedures Procedures   Medications Ordered in ED Medications - No data to display  ED Course  I have reviewed the triage vital signs and the nursing notes.  Pertinent labs & imaging results that were available during my care of the patient were reviewed by me and considered in my medical decision making (see chart for details).    MDM Rules/Calculators/A&P                         Reviewed nursing notes and prior charts for additional history.  It appears patient scheduled for IHR 5/9.    General surgery/Dr Constance Haw consulted - discussed pt with her - she checked surgery schedule and indicates it looks like 5/9 is soonest can be scheduled, and no need for emergent OR.   Will provide small quantity pain rx to patient.   Return precautions provided.   Pt currently appears stable for d/c.    Final Clinical Impression(s) / ED Diagnoses Final diagnoses:  None    Rx / DC Orders ED Discharge Orders    None       Lajean Saver, MD 04/18/21 1815

## 2021-04-20 NOTE — Patient Instructions (Signed)
Ronnie Patrick  04/20/2021     @PREFPERIOPPHARMACY @   Your procedure is scheduled on 04/24/2021   Report to [facility] at  1005  A.M.   Call this number if you have problems the morning of surgery:  [phone]   Remember:  Do not eat or drink after midnight.                         Take these medicines the morning of surgery with A SIP OF WATER  mobic or tramadol (if needed), metoprolol, protonix.  Use your inhaler before you come and bring your rescue inhaler with you.       Do not wear jewelry, make-up or nail polish.  Do not wear lotions, powders, or perfumes, or deodorant.  Do not shave 48 hours prior to surgery.  Men may shave face and neck.  Do not bring valuables to the hospital.  [facility] is not responsible for any belongings or valuables.   Place clean sheets on your bed the night before your procedure and DO NOT sleep with pets this night.  Shower with CHG the night before and the morning of your procedure. DO NOT use CHG on your face, hair or genitals.  After each shower, dry off with a clean towel, put on clean, comfortable clothes and brush your teeth.     Contacts, dentures or bridgework may not be worn into surgery.  Leave your suitcase in the car.  After surgery it may be brought to your room.  For patients admitted to the hospital, discharge time will be determined by your treatment team.  Patients discharged the day of surgery will not be allowed to drive home and must have someone with them for 24 hours.   Special instructions:  DO NOT smoke tobacco or vape for 24 hours before your procedure.   Please read over the following fact sheets that you were given. Coughing and Deep Breathing, Surgical Site Infection Prevention, Anesthesia Post-op Instructions and Care and Recovery After Surgery       Open Hernia Repair, Adult, Care After What can I expect after the procedure? After the procedure, it is common to have:  Mild  discomfort.  Slight bruising.  Mild swelling.  Pain in the belly (abdomen).  A small amount of blood from the cut from surgery (incision). Follow these instructions at home: Your doctor may give you more specific instructions. If you have problems, call your doctor. Medicines  Take over-the-counter and prescription medicines only as told by your doctor.  If told, take steps to prevent problems with pooping (constipation). You may need to: ? Drink enough fluid to keep your pee (urine) pale yellow. ? Take medicines. You will be told what medicines to take. ? Eat foods that are high in fiber. These include beans, whole grains, and fresh fruits and vegetables. ? Limit foods that are high in fat and sugar. These include fried or sweet foods.  Ask your doctor if you should avoid driving or using machines while you are taking your medicine. Incision care  Follow instructions from your doctor about how to take care of your incision. Make sure you: ? Wash your hands with soap and water for at least 20 seconds before and after you change your bandage (dressing). If you cannot use soap and water, use hand sanitizer. ? Change your bandage. ? Leave stitches or skin glue in place for at least 2  weeks. ? Leave tape strips alone unless you are told to take them off. You may trim the edges of the tape strips if they curl up.  Check your incision every day for signs of infection. Check for: ? More redness, swelling, or pain. ? More fluid or blood. ? Warmth. ? Pus or a bad smell.  Wear loose, soft clothing while your incision heals.   Activity  Rest as told by your doctor.  Do not lift anything that is heavier than 10 lb (4.5 kg), or the limit that you are told.  Do not play contact sports until your doctor says that this is safe.  If you were given a sedative during your procedure, do not drive or use machines until your doctor says that it is safe. A sedative is a medicine that helps you  relax.  Return to your normal activities when your doctor says that it is safe.   General instructions  Do not take baths, swim, or use a hot tub. Ask your doctor about taking showers or sponge baths.  Hold a pillow over your belly when you cough or sneeze. This helps with pain.  Do not smoke or use any products that contain nicotine or tobacco. If you need help quitting, ask your doctor.  Keep all follow-up visits. Contact a doctor if:  You have any of these signs of infection in or around your incision: ? More redness, swelling, or pain. ? More fluid or blood. ? Warmth. ? Pus. ? A bad smell.  You have a fever or chills.  You have blood in your poop (stool).  You have not pooped (had a bowel movement) in 2-3 days.  Medicine does not help your pain. Get help right away if:  You have chest pain, or you are short of breath.  You feel faint or light-headed.  You have very bad pain.  You vomit and your pain is worse.  You have pain, swelling, or redness in a leg. These symptoms may be an emergency. Get help right away. Call your local emergency services (911 in the [place]  Do not wait to see if the symptoms will go away.  Do not drive yourself to the hospital. Summary  After this procedure, it is common to have mild discomfort, slight bruising, and mild swelling.  Follow instructions from your doctor about how to take care of your cut from surgery (incision). Check every day for signs of infection.  Do not lift heavy objects or play contact sports until your doctor says it is safe.  Return to your normal activities as told by your doctor. This information is not intended to replace advice given to you by your health care provider. Make sure you discuss any questions you have with your health care provider. Document Revised: 07/18/2020 Document Reviewed: 07/18/2020 Elsevier Patient Education  2021 [place] Anesthesia, Adult, Care After This sheet  gives you information about how to care for yourself after your procedure. Your health care provider may also give you more specific instructions. If you have problems or questions, contact your health care provider. What can I expect after the procedure? After the procedure, the following side effects are common:  Pain or discomfort at the IV site.  Nausea.  Vomiting.  Sore throat.  Trouble concentrating.  Feeling cold or chills.  Feeling weak or tired.  Sleepiness and fatigue.  Soreness and body aches. These side effects can affect parts of the body that were not  involved in surgery. Follow these instructions at home: For the time period you were told by your health care provider:  Rest.  Do not participate in activities where you could fall or become injured.  Do not drive or use machinery.  Do not drink alcohol.  Do not take sleeping pills or medicines that cause drowsiness.  Do not make important decisions or sign legal documents.  Do not take care of children on your own.   Eating and drinking  Follow any instructions from your health care provider about eating or drinking restrictions.  When you feel hungry, start by eating small amounts of foods that are soft and easy to digest (bland), such as toast. Gradually return to your regular diet.  Drink enough fluid to keep your urine pale yellow.  If you vomit, rehydrate by drinking water, juice, or clear broth. General instructions  If you have sleep apnea, surgery and certain medicines can increase your risk for breathing problems. Follow instructions from your health care provider about wearing your sleep device: ? Anytime you are sleeping, including during daytime naps. ? While taking prescription pain medicines, sleeping medicines, or medicines that make you drowsy.  Have a responsible adult stay with you for the time you are told. It is important to have someone help care for you until you are awake and  alert.  Return to your normal activities as told by your health care provider. Ask your health care provider what activities are safe for you.  Take over-the-counter and prescription medicines only as told by your health care provider.  If you smoke, do not smoke without supervision.  Keep all follow-up visits as told by your health care provider. This is important. Contact a health care provider if:  You have nausea or vomiting that does not get better with medicine.  You cannot eat or drink without vomiting.  You have pain that does not get better with medicine.  You are unable to pass urine.  You develop a skin rash.  You have a fever.  You have redness around your IV site that gets worse. Get help right away if:  You have difficulty breathing.  You have chest pain.  You have blood in your urine or stool, or you vomit blood. Summary  After the procedure, it is common to have a sore throat or nausea. It is also common to feel tired.  Have a responsible adult stay with you for the time you are told. It is important to have someone help care for you until you are awake and alert.  When you feel hungry, start by eating small amounts of foods that are soft and easy to digest (bland), such as toast. Gradually return to your regular diet.  Drink enough fluid to keep your urine pale yellow.  Return to your normal activities as told by your health care provider. Ask your health care provider what activities are safe for you. This information is not intended to replace advice given to you by your health care provider. Make sure you discuss any questions you have with your health care provider. Document Revised: 08/18/2020 Document Reviewed: 03/17/2020 Elsevier Patient Education  2021 [employer].

## 2021-04-21 ENCOUNTER — Encounter (HOSPITAL_COMMUNITY)
Admission: RE | Admit: 2021-04-21 | Discharge: 2021-04-21 | Disposition: A | Discharge status: Home / Self Care | Payer: Medicare HMO | Source: Ambulatory Visit | Attending: General Surgery | Admitting: General Surgery

## 2021-04-21 ENCOUNTER — Other Ambulatory Visit (HOSPITAL_COMMUNITY)
Admission: RE | Admit: 2021-04-21 | Discharge: 2021-04-21 | Disposition: A | Discharge status: Home / Self Care | Payer: Medicare HMO | Source: Ambulatory Visit | Attending: General Surgery | Admitting: General Surgery

## 2021-04-21 ENCOUNTER — Encounter (HOSPITAL_COMMUNITY): Payer: Self-pay

## 2021-04-21 ENCOUNTER — Other Ambulatory Visit: Payer: Self-pay

## 2021-04-21 DIAGNOSIS — Z20822 Contact with and (suspected) exposure to covid-19: Secondary | ICD-10-CM | POA: Insufficient documentation

## 2021-04-21 DIAGNOSIS — Z01818 Encounter for other preprocedural examination: Secondary | ICD-10-CM | POA: Insufficient documentation

## 2021-04-21 HISTORY — DX: Irritable bowel syndrome, unspecified: K58.9

## 2021-04-21 LAB — CBC WITH DIFFERENTIAL/PLATELET
Abs Immature Granulocytes: 0.02 10*3/uL (ref 0.00–0.07)
Basophils Absolute: 0 10*3/uL (ref 0.0–0.1)
Basophils Relative: 0 %
Eosinophils Absolute: 0.2 10*3/uL (ref 0.0–0.5)
Eosinophils Relative: 3 %
HCT: 46.1 % (ref 39.0–52.0)
Hemoglobin: 15.5 g/dL (ref 13.0–17.0)
Immature Granulocytes: 0 %
Lymphocytes Relative: 19 %
Lymphs Abs: 1.2 10*3/uL (ref 0.7–4.0)
MCH: 30.7 pg (ref 26.0–34.0)
MCHC: 33.6 g/dL (ref 30.0–36.0)
MCV: 91.3 fL (ref 80.0–100.0)
Monocytes Absolute: 0.6 10*3/uL (ref 0.1–1.0)
Monocytes Relative: 9 %
Neutro Abs: 4.2 10*3/uL (ref 1.7–7.7)
Neutrophils Relative %: 69 %
Platelets: 202 10*3/uL (ref 150–400)
RBC: 5.05 MIL/uL (ref 4.22–5.81)
RDW: 14.1 % (ref 11.5–15.5)
WBC: 6.1 10*3/uL (ref 4.0–10.5)
nRBC: 0 % (ref 0.0–0.2)

## 2021-04-21 LAB — BASIC METABOLIC PANEL
Anion gap: 7 (ref 5–15)
BUN: 16 mg/dL (ref 8–23)
CO2: 27 mmol/L (ref 22–32)
Calcium: 9.3 mg/dL (ref 8.9–10.3)
Chloride: 97 mmol/L — ABNORMAL LOW (ref 98–111)
Creatinine, Ser: 0.76 mg/dL (ref 0.61–1.24)
GFR, Estimated: >60 mL/min (ref >60)
Glucose, Bld: 108 mg/dL — ABNORMAL HIGH (ref 70–99)
Potassium: 4.4 mmol/L (ref 3.5–5.1)
Sodium: 131 mmol/L — ABNORMAL LOW (ref 135–145)

## 2021-04-21 LAB — SARS CORONAVIRUS 2 (TAT 6-24 HRS): SARS Coronavirus 2: NEGATIVE

## 2021-04-21 NOTE — Progress Notes (Signed)
Patient has been tested for sleep apnea but does not have it.

## 2021-04-24 ENCOUNTER — Ambulatory Visit (HOSPITAL_COMMUNITY): Payer: Medicare HMO | Admitting: Anesthesiology

## 2021-04-24 ENCOUNTER — Ambulatory Visit (HOSPITAL_COMMUNITY)
Admission: RE | Admit: 2021-04-24 | Discharge: 2021-04-24 | Disposition: A | Discharge status: Home / Self Care | Payer: Medicare HMO | Attending: General Surgery | Admitting: General Surgery

## 2021-04-24 ENCOUNTER — Other Ambulatory Visit: Payer: Self-pay

## 2021-04-24 ENCOUNTER — Encounter (HOSPITAL_COMMUNITY)
Admission: RE | Disposition: A | Discharge status: Home / Self Care | Payer: Self-pay | Source: Home / Self Care | Attending: General Surgery

## 2021-04-24 DIAGNOSIS — Z87891 Personal history of nicotine dependence: Secondary | ICD-10-CM | POA: Insufficient documentation

## 2021-04-24 DIAGNOSIS — K4091 Unilateral inguinal hernia, without obstruction or gangrene, recurrent: Secondary | ICD-10-CM | POA: Diagnosis present

## 2021-04-24 DIAGNOSIS — Z79899 Other long term (current) drug therapy: Secondary | ICD-10-CM | POA: Diagnosis not present

## 2021-04-24 DIAGNOSIS — Z791 Long term (current) use of non-steroidal anti-inflammatories (NSAID): Secondary | ICD-10-CM | POA: Insufficient documentation

## 2021-04-24 DIAGNOSIS — Z880 Allergy status to penicillin: Secondary | ICD-10-CM | POA: Insufficient documentation

## 2021-04-24 DIAGNOSIS — Z881 Allergy status to other antibiotic agents status: Secondary | ICD-10-CM | POA: Insufficient documentation

## 2021-04-24 HISTORY — PX: INGUINAL HERNIA REPAIR: SHX194

## 2021-04-24 SURGERY — REPAIR, HERNIA, INGUINAL, ADULT
Anesthesia: General | Site: Groin | Laterality: Right

## 2021-04-24 MED ORDER — CHLORHEXIDINE GLUCONATE 0.12 % MT SOLN
15.0000 mL | Freq: Once | OROMUCOSAL | Status: AC
  Administered 2021-04-24: 15 mL via OROMUCOSAL

## 2021-04-24 MED ORDER — ONDANSETRON HCL 4 MG/2ML IJ SOLN: 4.0000 mg | Freq: Once | INTRAMUSCULAR | Status: DC | PRN

## 2021-04-24 MED ORDER — BUPIVACAINE LIPOSOME 1.3 % IJ SUSP
INTRAMUSCULAR | Status: DC | PRN
  Administered 2021-04-24: 20 mL

## 2021-04-24 MED ORDER — FENTANYL CITRATE (PF) 100 MCG/2ML IJ SOLN
INTRAMUSCULAR | Status: AC
  Filled 2021-04-24: qty 2

## 2021-04-24 MED ORDER — SODIUM CHLORIDE 0.9 % IR SOLN
Status: DC | PRN
  Administered 2021-04-24: 1

## 2021-04-24 MED ORDER — ONDANSETRON HCL 4 MG PO TABS: 4.0000 mg | ORAL_TABLET | Freq: Three times a day (TID) | ORAL | 1 refills | Status: DC | PRN

## 2021-04-24 MED ORDER — PROPOFOL 10 MG/ML IV BOLUS
INTRAVENOUS | Status: DC | PRN
  Administered 2021-04-24: 150 mg via INTRAVENOUS

## 2021-04-24 MED ORDER — ONDANSETRON HCL 4 MG/2ML IJ SOLN
INTRAMUSCULAR | Status: DC | PRN
  Administered 2021-04-24: 4 mg via INTRAVENOUS

## 2021-04-24 MED ORDER — CIPROFLOXACIN IN D5W 400 MG/200ML IV SOLN
400.0000 mg | INTRAVENOUS | Status: AC
  Administered 2021-04-24: 400 mg via INTRAVENOUS

## 2021-04-24 MED ORDER — ORAL CARE MOUTH RINSE
15.0000 mL | Freq: Once | OROMUCOSAL | Status: AC
  Administered 2021-04-24: 15 mL via OROMUCOSAL

## 2021-04-24 MED ORDER — LIDOCAINE HCL (CARDIAC) PF 100 MG/5ML IV SOSY
PREFILLED_SYRINGE | INTRAVENOUS | Status: DC | PRN
  Administered 2021-04-24: 50 mg via INTRAVENOUS

## 2021-04-24 MED ORDER — MIDAZOLAM HCL 2 MG/2ML IJ SOLN
INTRAMUSCULAR | Status: AC
  Filled 2021-04-24: qty 2

## 2021-04-24 MED ORDER — OXYCODONE HCL 5 MG PO TABS: 5.0000 mg | ORAL_TABLET | ORAL | 0 refills | Status: DC | PRN

## 2021-04-24 MED ORDER — EPHEDRINE SULFATE 50 MG/ML IJ SOLN
INTRAMUSCULAR | Status: DC | PRN
  Administered 2021-04-24 (×2): 10 mg via INTRAVENOUS

## 2021-04-24 MED ORDER — CHLORHEXIDINE GLUCONATE CLOTH 2 % EX PADS
6.0000 | MEDICATED_PAD | Freq: Once | CUTANEOUS | Status: AC
  Administered 2021-04-24: 6 via TOPICAL

## 2021-04-24 MED ORDER — PROPOFOL 10 MG/ML IV BOLUS
INTRAVENOUS | Status: AC
  Filled 2021-04-24: qty 20

## 2021-04-24 MED ORDER — ROCURONIUM BROMIDE 100 MG/10ML IV SOLN
INTRAVENOUS | Status: DC | PRN
  Administered 2021-04-24: 50 mg via INTRAVENOUS

## 2021-04-24 MED ORDER — MIDAZOLAM HCL 5 MG/5ML IJ SOLN
INTRAMUSCULAR | Status: DC | PRN
  Administered 2021-04-24: 2 mg via INTRAVENOUS

## 2021-04-24 MED ORDER — ONDANSETRON HCL 4 MG/2ML IJ SOLN
INTRAMUSCULAR | Status: AC
  Filled 2021-04-24: qty 2

## 2021-04-24 MED ORDER — HYDROMORPHONE HCL 1 MG/ML IJ SOLN: 0.2500 mg | INTRAMUSCULAR | Status: DC | PRN

## 2021-04-24 MED ORDER — SUGAMMADEX SODIUM 200 MG/2ML IV SOLN
INTRAVENOUS | Status: DC | PRN
  Administered 2021-04-24: 200 mg via INTRAVENOUS

## 2021-04-24 MED ORDER — CHLORHEXIDINE GLUCONATE CLOTH 2 % EX PADS: 6.0000 | MEDICATED_PAD | Freq: Once | CUTANEOUS | Status: DC

## 2021-04-24 MED ORDER — LACTATED RINGERS IV SOLN: INTRAVENOUS | Status: DC

## 2021-04-24 MED ORDER — FENTANYL CITRATE (PF) 100 MCG/2ML IJ SOLN
INTRAMUSCULAR | Status: DC | PRN
  Administered 2021-04-24: 100 ug via INTRAVENOUS

## 2021-04-24 MED ORDER — BUPIVACAINE LIPOSOME 1.3 % IJ SUSP
INTRAMUSCULAR | Status: AC
  Filled 2021-04-24: qty 20

## 2021-04-24 MED ORDER — CIPROFLOXACIN IN D5W 400 MG/200ML IV SOLN
INTRAVENOUS | Status: AC
  Filled 2021-04-24: qty 200

## 2021-04-24 SURGICAL SUPPLY — 37 items
ADH SKN CLS APL DERMABOND .7 (GAUZE/BANDAGES/DRESSINGS) ×1
CLOTH BEACON ORANGE TIMEOUT ST (SAFETY) ×2 IMPLANT
COVER LIGHT HANDLE STERIS (MISCELLANEOUS) ×4 IMPLANT
COVER WAND RF STERILE (DRAPES) ×2 IMPLANT
DERMABOND ADVANCED (GAUZE/BANDAGES/DRESSINGS) ×1
DERMABOND ADVANCED .7 DNX12 (GAUZE/BANDAGES/DRESSINGS) ×1 IMPLANT
DRAIN PENROSE 0.5X18 (DRAIN) ×2 IMPLANT
ELECT REM PT RETURN 9FT ADLT (ELECTROSURGICAL) ×2
ELECTRODE REM PT RTRN 9FT ADLT (ELECTROSURGICAL) ×1 IMPLANT
GAUZE SPONGE 4X4 12PLY STRL (GAUZE/BANDAGES/DRESSINGS) ×2 IMPLANT
GLOVE SURG ENC MOIS LTX SZ6.5 (GLOVE) ×2 IMPLANT
GLOVE SURG UNDER POLY LF SZ6.5 (GLOVE) ×2 IMPLANT
GLOVE SURG UNDER POLY LF SZ7 (GLOVE) ×6 IMPLANT
GOWN STRL REUS W/TWL LRG LVL3 (GOWN DISPOSABLE) ×6 IMPLANT
INST SET MINOR GENERAL (KITS) ×2 IMPLANT
KIT TURNOVER KIT A (KITS) ×2 IMPLANT
MANIFOLD NEPTUNE II (INSTRUMENTS) ×2 IMPLANT
MESH MARLEX PLUG MEDIUM (Mesh General) ×2 IMPLANT
NEEDLE HYPO 18GX1.5 BLUNT FILL (NEEDLE) ×2 IMPLANT
NEEDLE HYPO 21X1.5 SAFETY (NEEDLE) ×2 IMPLANT
NS IRRIG 1000ML POUR BTL (IV SOLUTION) ×2 IMPLANT
PACK MINOR (CUSTOM PROCEDURE TRAY) ×2 IMPLANT
PAD ARMBOARD 7.5X6 YLW CONV (MISCELLANEOUS) ×2 IMPLANT
PENCIL SMOKE EVACUATOR (MISCELLANEOUS) ×2 IMPLANT
SET BASIN LINEN APH (SET/KITS/TRAYS/PACK) ×2 IMPLANT
SOL PREP PROV IODINE SCRUB 4OZ (MISCELLANEOUS) ×2 IMPLANT
SUT MNCRL AB 4-0 PS2 18 (SUTURE) ×2 IMPLANT
SUT NOVA NAB GS-22 2 2-0 T-19 (SUTURE) ×6 IMPLANT
SUT SILK 3 0 (SUTURE)
SUT SILK 3-0 18XBRD TIE 12 (SUTURE) IMPLANT
SUT VIC AB 2-0 CT1 27 (SUTURE) ×2
SUT VIC AB 2-0 CT1 TAPERPNT 27 (SUTURE) ×1 IMPLANT
SUT VIC AB 3-0 SH 27 (SUTURE) ×2
SUT VIC AB 3-0 SH 27X BRD (SUTURE) ×1 IMPLANT
SUT VICRYL AB 3 0 TIES (SUTURE) ×2 IMPLANT
SYR 20ML LL LF (SYRINGE) ×2 IMPLANT
SYR 30ML LL (SYRINGE) ×2 IMPLANT

## 2021-04-24 NOTE — Anesthesia Postprocedure Evaluation (Signed)
Anesthesia Post Note  Patient: Ronnie Patrick  Procedure(s) Performed: HERNIA REPAIR INGUINAL ADULT/ RECURRENT (Right Groin)  Patient location during evaluation: PACU Anesthesia Type: General Level of consciousness: awake and alert and oriented Pain management: pain level controlled Vital Signs Assessment: post-procedure vital signs reviewed and stable Respiratory status: spontaneous breathing and respiratory function stable Cardiovascular status: blood pressure returned to baseline and stable Postop Assessment: no apparent nausea or vomiting Anesthetic complications: no   No complications documented.   Last Vitals:  Vitals:   04/24/21 1345 04/24/21 1414  BP: (!) 147/72 (!) 157/78  Pulse: 70 (!) 56  Resp: 16 16  Temp:    SpO2: 99% 100%    Last Pain:  Vitals:   04/24/21 1414  TempSrc:   PainSc: 0-No pain                 Sherria Riemann C [name]

## 2021-04-24 NOTE — Transfer of Care (Signed)
Immediate Anesthesia Transfer of Care Note  Patient: Ronnie Patrick  Procedure(s) Performed: HERNIA REPAIR INGUINAL ADULT/ RECURRENT (Right Groin)  Patient Location: PACU  Anesthesia Type:General  Level of Consciousness: awake, alert , oriented and patient cooperative  Airway & Oxygen Therapy: Patient Spontanous Breathing  Post-op Assessment: Report given to RN, Post -op Vital signs reviewed and stable and Patient moving all extremities X 4  Post vital signs: Reviewed and stable  Last Vitals:  Vitals Value Taken Time  BP    Temp    Pulse    Resp    SpO2      Last Pain:  Vitals:   04/24/21 1132  TempSrc: Oral  PainSc:          Complications: No complications documented.

## 2021-04-24 NOTE — Discharge Instructions (Signed)
Discharge Instructions Hernia:  Common Complaints: Pain at the incision site is common. This will improve with time. Take your pain medications as described below. Some nausea is common and poor appetite. The main goal is to stay hydrated the first few days after surgery.  Numbness at the incision or the thigh is common.  If you start to have burning or tingling pain in your groin, this is from a nerve being pinched. Please call and we can prescribe you a different type of pain medication for nerve pain.  Expect some bruising and swelling in the groin and testicle area. You can tent your scrotum up with a towel under it and you can ice the area the first 72 hours.   Diet/ Activity: Diet as tolerated. You may not have an appetite, but it is important to stay hydrated. Drink 64 ounces of water a day. Your appetite will return with time.  Shower per your regular routine daily.  Do not take hot showers. Take warm showers that are less than 10 minutes. Rest and listen to your body, but do not remain in bed all day.  Walk everyday for at least 15-20 minutes. Deep cough and move around every 1-2 hours in the first few days after surgery.  Do not pick at the dermabond glue on your incision sites.  This glue film will remain in place for 1-2 weeks and will start to peel off.  Do not place lotions or balms on your incision unless instructed to specifically by Dr. Constance Haw.  Do not lift > 10 lbs, perform excessive bending, pushing, pulling, squatting for 6-8 weeks after surgery.   Pain Expectations and Narcotics: -After surgery you will have pain associated with your incisions and this is normal. The pain is muscular and nerve pain, and will get better with time. -You are encouraged and expected to take non narcotic medications like tylenol and ibuprofen (when able) to treat pain as multiple modalities can aid with pain treatment. -Narcotics are only used when pain is severe or there is breakthrough  pain. -You are not expected to have a pain score of 0 after surgery, as we cannot prevent pain. A pain score of 3-4 that allows you to be functional, move, walk, and tolerate some activity is the goal. The pain will continue to improve over the days after surgery and is dependent on your surgery. -Due to [place] law, we are only able to give a certain amount of pain medication to treat post operative pain, and we only give additional narcotics on a patient by patient basis.  -For most laparoscopic surgery, studies have shown that the majority of patients only need 10-15 narcotic pills, and for open surgeries most patients only need 15-20.   -Having appropriate expectations of pain and knowledge of pain management with non narcotics is important as we do not want anyone to become addicted to narcotic pain medication.  -Using ice packs in the first 48 hours and heating pads after 48 hours, wearing an abdominal binder (when recommended), and using over the counter medications are all ways to help with pain management.   -Simple acts like meditation and mindfulness practices after surgery can also help with pain control and research has proven the benefit of these practices.  Medication: Take tylenol and ibuprofen as needed for pain control, alternating every 4-6 hours.  Example:  Tylenol 1000mg  @ 6am, 12noon, 6pm, 48midnight (Do not exceed 4000mg  of tylenol a day). Ibuprofen 800mg  @ 9am, 3pm, 9pm, 3am (  Do not exceed 3600mg  of ibuprofen a day).  Take Roxicodone for breakthrough pain every 4 hours.  Take Colace for constipation related to narcotic pain medication. If you do not have a bowel movement in 2 days, take Miralax over the counter.  Drink plenty of water to also prevent constipation.   Contact Information: If you have questions or concerns, please call our office, [phone], Monday- Thursday 8AM-5PM and Friday 8AM-12Noon.  If it is after hours or on the weekend, please call [facility],  [phone], [phone], and ask to speak to the surgeon on call for Dr. Constance Haw at [facility].    Open Hernia Repair, Adult, Care After What can I expect after the procedure? After the procedure, it is common to have:  Mild discomfort.  Slight bruising.  Mild swelling.  Pain in the belly (abdomen).  A small amount of blood from the cut from surgery (incision). Follow these instructions at home: Your doctor may give you more specific instructions. If you have problems, call your doctor. Medicines  Take over-the-counter and prescription medicines only as told by your doctor.  If told, take steps to prevent problems with pooping (constipation). You may need to: ? Drink enough fluid to keep your pee (urine) pale yellow. ? Take medicines. You will be told what medicines to take. ? Eat foods that are high in fiber. These include beans, whole grains, and fresh fruits and vegetables. ? Limit foods that are high in fat and sugar. These include fried or sweet foods.  Ask your doctor if you should avoid driving or using machines while you are taking your medicine. Incision care ? Follow instructions from your doctor about how to take care of your incision. Make sure you: ? Leave stitches or skin glue in place for at least 2 weeks.  Check your incision every day for signs of infection. Check for: ? More redness, swelling, or pain. ? More fluid or blood. ? Warmth. ? Pus or a bad smell.  Wear loose, soft clothing while your incision heals.   Activity  Rest as told by your doctor.  Do not lift anything that is heavier than 10 lb (4.5 kg), or the limit that you are told.  Do not play contact sports until your doctor says that this is safe.  If you were given a sedative during your procedure, do not drive or use machines until your doctor says that it is safe. A sedative is a medicine that helps you relax.  Return to your normal activities when your doctor says that it is safe.    General instructions  Do not take baths, swim, or use a hot tub. Ask your doctor about taking showers or sponge baths.  Hold a pillow over your belly when you cough or sneeze. This helps with pain.  Do not smoke or use any products that contain nicotine or tobacco. If you need help quitting, ask your doctor.  Keep all follow-up visits. Contact a doctor if:  You have any of these signs of infection in or around your incision: ? More redness, swelling, or pain. ? More fluid or blood. ? Warmth. ? Pus. ? A bad smell.  You have a fever or chills.  You have blood in your poop (stool).  You have not pooped (had a bowel movement) in 2-3 days.  Medicine does not help your pain. Get help right away if:  You have chest pain, or you are short of breath.  You feel faint  or light-headed.  You have very bad pain.  You vomit and your pain is worse.  You have pain, swelling, or redness in a leg. These symptoms may be an emergency. Get help right away. Call your local emergency services (911 in the [place]  Do not wait to see if the symptoms will go away.  Do not drive yourself to the hospital. Summary  After this procedure, it is common to have mild discomfort, slight bruising, and mild swelling.  Follow instructions from your doctor about how to take care of your cut from surgery (incision). Check every day for signs of infection.  Do not lift heavy objects or play contact sports until your doctor says it is safe.  Return to your normal activities as told by your doctor. This information is not intended to replace advice given to you by your health care provider. Make sure you discuss any questions you have with your health care provider. Document Revised: 07/18/2020 Document Reviewed: 07/18/2020 Elsevier Patient Education  2021 [place] Anesthesia, Adult, Care After This sheet gives you information about how to care for yourself after your procedure. Your  health care provider may also give you more specific instructions. If you have problems or questions, contact your health care provider. What can I expect after the procedure? After the procedure, the following side effects are common:  Pain or discomfort at the IV site.  Nausea.  Vomiting.  Sore throat.  Trouble concentrating.  Feeling cold or chills.  Feeling weak or tired.  Sleepiness and fatigue.  Soreness and body aches. These side effects can affect parts of the body that were not involved in surgery. Follow these instructions at home: For the time period you were told by your health care provider:  Rest.  Do not participate in activities where you could fall or become injured.  Do not drive or use machinery.  Do not drink alcohol.  Do not take sleeping pills or medicines that cause drowsiness.  Do not make important decisions or sign legal documents.  Do not take care of children on your own.   Eating and drinking  Follow any instructions from your health care provider about eating or drinking restrictions.  When you feel hungry, start by eating small amounts of foods that are soft and easy to digest (bland), such as toast. Gradually return to your regular diet.  Drink enough fluid to keep your urine pale yellow.  If you vomit, rehydrate by drinking water, juice, or clear broth. General instructions  If you have sleep apnea, surgery and certain medicines can increase your risk for breathing problems. Follow instructions from your health care provider about wearing your sleep device: ? Anytime you are sleeping, including during daytime naps. ? While taking prescription pain medicines, sleeping medicines, or medicines that make you drowsy.  Have a responsible adult stay with you for the time you are told. It is important to have someone help care for you until you are awake and alert.  Return to your normal activities as told by your health care provider.  Ask your health care provider what activities are safe for you.  Take over-the-counter and prescription medicines only as told by your health care provider.  If you smoke, do not smoke without supervision.  Keep all follow-up visits as told by your health care provider. This is important. Contact a health care provider if:  You have nausea or vomiting that does not get better with  medicine.  You cannot eat or drink without vomiting.  You have pain that does not get better with medicine.  You are unable to pass urine.  You develop a skin rash.  You have a fever.  You have redness around your IV site that gets worse. Get help right away if:  You have difficulty breathing.  You have chest pain.  You have blood in your urine or stool, or you vomit blood. Summary  After the procedure, it is common to have a sore throat or nausea. It is also common to feel tired.  Have a responsible adult stay with you for the time you are told. It is important to have someone help care for you until you are awake and alert.  When you feel hungry, start by eating small amounts of foods that are soft and easy to digest (bland), such as toast. Gradually return to your regular diet.  Drink enough fluid to keep your urine pale yellow.  Return to your normal activities as told by your health care provider. Ask your health care provider what activities are safe for you. This information is not intended to replace advice given to you by your health care provider. Make sure you discuss any questions you have with your health care provider. Document Revised: 08/18/2020 Document Reviewed: 03/17/2020 Elsevier Patient Education  2021 Elsevier Inc.    PLEASE [place] [facility] [place] UNTIL Friday Apr 28, 2021. DO NOT USE ADDITIONAL NUMBING MEDICATIONS WITHOUT CONSULTING A PHYSICIAN UNTIL AFTER Friday   Bupivacaine Liposomal Suspension for Injection What is this medicine? BUPIVACAINE LIPOSOMAL  (bue PIV a kane LIP oh som al) is an anesthetic. It causes loss of feeling in the skin or other tissues. It is used to prevent and to treat pain from some procedures. This medicine may be used for other purposes; ask your health care provider or pharmacist if you have questions. COMMON BRAND NAME(S): EXPAREL What should I tell my health care provider before I take this medicine? They need to know if you have any of these conditions:  G6PD deficiency  heart disease  kidney disease  liver disease  low blood pressure  lung or breathing disease, like asthma  an unusual or allergic reaction to bupivacaine, other medicines, foods, dyes, or preservatives  pregnant or trying to get pregnant  breast-feeding How should I use this medicine? This medicine is injected into the affected area. It is given by a health care provider in a hospital or clinic setting. Talk to your health care provider about the use of this medicine in children. While it may be given to children as young as 6 years for selected conditions, precautions do apply. Overdosage: If you think you have taken too much of this medicine contact a poison control center or emergency room at once. NOTE: This medicine is only for you. Do not share this medicine with others. What if I miss a dose? This does not apply. What may interact with this medicine? This medicine may interact with the following medications:  acetaminophen  certain antibiotics like dapsone, nitrofurantoin, aminosalicylic acid, sulfonamides  certain medicines for seizures like phenobarbital, phenytoin, valproic acid  chloroquine  cyclophosphamide  flutamide  hydroxyurea  ifosfamide  metoclopramide  nitric oxide  nitroglycerin  nitroprusside  nitrous oxide  other local anesthetics like lidocaine, pramoxine, tetracaine  primaquine  quinine  rasburicase  sulfasalazine This list may not describe all possible interactions. Give your  health care provider a list of all  the medicines, herbs, non-prescription drugs, or dietary supplements you use. Also tell them if you smoke, drink alcohol, or use illegal drugs. Some items may interact with your medicine. What should I watch for while using this medicine? Your condition will be monitored carefully while you are receiving this medicine. Be careful to avoid injury while the area is numb, and you are not aware of pain. What side effects may I notice from receiving this medicine? Side effects that you should report to your doctor or health care professional as soon as possible:  allergic reactions like skin rash, itching or hives, swelling of the face, lips, or tongue  seizures  signs and symptoms of a dangerous change in heartbeat or heart rhythm like chest pain; dizziness; fast, irregular heartbeat; palpitations; feeling faint or lightheaded; falls; breathing problems  signs and symptoms of methemoglobinemia such as pale, gray, or blue colored skin; headache; fast heartbeat; shortness of breath; feeling faint or lightheaded, falls; tiredness Side effects that usually do not require medical attention (report to your doctor or health care professional if they continue or are bothersome):  anxious  back pain  changes in taste  changes in vision  constipation  dizziness  fever  nausea, vomiting This list may not describe all possible side effects. Call your doctor for medical advice about side effects. You may report side effects to FDA at 1-800-FDA-1088. Where should I keep my medicine? This drug is given in a hospital or clinic and will not be stored at home. NOTE: This sheet is a summary. It may not cover all possible information. If you have questions about this medicine, talk to your doctor, pharmacist, or health care provider.  2021 Elsevier/Gold Standard (2020-03-10 12:24:57)    Oxycodone Capsules or Tablets What is this medicine? OXYCODONE (ox i KOE done)  is a pain reliever, also called an opioid. It treats severe pain. This medicine may be used for other purposes; ask your health care provider or pharmacist if you have questions. COMMON BRAND NAME(S): Dazidox, Endocodone, Oxaydo, OXECTA, OxyIR, Percolone, Roxicodone, Roxybond What should I tell my health care provider before I take this medicine? They need to know if you have any of these conditions:  brain tumor  drug abuse or addiction  head injury  heart disease  if you often drink alcohol  kidney disease  liver disease  low adrenal gland function  lung disease, asthma, or breathing problem  seizures  stomach or intestine problems  taken an MAOI such as Marplan, Nardil, or Parnate in the last 14 days  an unusual or allergic reaction to oxycodone, other drugs, foods, dyes, or preservatives  pregnant or trying to get pregnant  breast-feeding How should I use this medicine? Take this medicine by mouth with water. Take it as directed on the prescription label at the same time every day. You can take it with or without food. If it upsets your stomach, take it with food. Keep taking it unless your health care provider tells you to stop. Some brands of this medicine, like Oxaydo, have special instructions. Ask your doctor or pharmacist if these directions are for you: Do not cut, crush or chew this medicine. Do not wet, soak, or lick the tablet before you take it. A special MedGuide will be given to you by the pharmacist with each prescription and refill. Be sure to read this information carefully each time. Talk to your pediatrician regarding the use of this medicine in children. Special care may be needed.  Overdosage: If you think you have taken too much of this medicine contact a poison control center or emergency room at once. NOTE: This medicine is only for you. Do not share this medicine with others. What if I miss a dose? If you miss a dose, take it as soon as you can.  If it is almost time for your next dose, take only that dose. Do not take double or extra doses. What may interact with this medicine? Do not take this medicine with any of the following medications:  safinamide This medicine may interact with the following medications:  alcohol  antihistamines for allergy, cough, and cold  atropine  certain antivirals for HIV or hepatitis  certain antibiotics like clarithromycin, erythromycin, linezolid, rifampin  certain medicines for anxiety or sleep  certain medicines for bladder problems like oxybutynin, tolterodine  certain medicines for depression like amitriptyline, fluoxetine, sertraline  certain medicines for fungal infections like ketoconazole, itraconazole, posaconazole  certain medicines for migraine headache like almotriptan, eletriptan, frovatriptan, naratriptan, rizatriptan, sumatriptan, zolmitriptan  certain medicines for nausea or vomiting like dolasetron, granisetron, ondansetron, palonosetron  certain medicines for Parkinson's disease like benztropine, trihexyphenidyl  certain medicines for seizures like carbamazepine, phenobarbital, phenytoin, primidone  certain medicines for stomach problems like dicyclomine, hyoscyamine  certain medicines for travel sickness like scopolamine  diuretics  general anesthetics like halothane, isoflurane, methoxyflurane, propofol  ipratropium  MAOIs like Marplan, Nardil, and Parnate  medicines that relax muscles  methylene blue  other narcotic medicines for pain or cough  phenothiazines like chlorpromazine, mesoridazine, prochlorperazine, thioridazine This list may not describe all possible interactions. Give your health care provider a list of all the medicines, herbs, non-prescription drugs, or dietary supplements you use. Also tell them if you smoke, drink alcohol, or use illegal drugs. Some items may interact with your medicine. What should I watch for while using this  medicine? Tell your health care provider if your pain does not go away, if it gets worse, or if you have new or a different type of pain. You may develop tolerance to this medicine. Tolerance means that you will need a higher dose of the medicine for pain relief. Tolerance is normal and is expected if you take this medicine for a long time. Do not suddenly stop taking your medicine because you may develop a severe reaction. Your body becomes used to the medicine. This does NOT mean you are addicted. Addiction is a behavior related to getting and using a medicine for a nonmedical reason. If you have pain, you have a medical reason to take pain medicine. Your health care provider will tell you how much medicine to take. If your health care provider wants you to stop the medicine, the dose will be slowly lowered over time to avoid any side effects. If you take other medicines that also cause drowsiness such as other narcotic pain medicines, benzodiazepines, or other medicines for sleep, you may have more side effects. Give your health care provider a list of all medicines you use. He or she will tell you how much medicine to take. Do not take more medicine than directed. Get emergency help right away if you have trouble breathing or are unusually tired or sleepy. Talk to your health care provider about naloxone and how to get it. Naloxone is an emergency medicine used for an opioid overdose. An overdose can happen if you take too much opioid. It can also happen if an opioid is taken with some other medicines or substances,  such as alcohol. Know the symptoms of an overdose, such as trouble breathing, unusually tired or sleepy, or not being able to respond or wake up. Make sure to tell caregivers and close contacts where it is stored. Make sure they know how to use it. After naloxone is given, you must get emergency help right away. Naloxone is a temporary treatment. Repeat doses may be needed. You may get drowsy or  dizzy. Do not drive, use machinery, or do anything that needs mental alertness until you know how this medicine affects you. Do not stand up or sit up quickly, especially if you are an older patient. This reduces the risk of dizzy or fainting spells. Alcohol may interfere with the effect of this medicine. Avoid alcoholic drinks. This medicine will cause constipation. If you do not have a bowel movement for 3 days, call your health care provider. Your mouth may get dry. Chewing sugarless gum or sucking hard candy and drinking plenty of water may help. Contact your health care provider if the problem does not go away or is severe. What side effects may I notice from receiving this medicine? Side effects that you should report to your doctor or health care professional as soon as possible:  allergic reactions (skin rash, itching or hives; swelling of the face, lips, or tongue)  confusion  kidney injury (trouble passing urine or change in the amount of urine)  low adrenal gland function (nausea; vomiting; loss of appetite; unusually weak or tired; dizziness; low blood pressure)  low blood pressure (dizziness; feeling faint or lightheaded, falls; unusually weak or tired)  serotonin syndrome (irritable; confusion; diarrhea; fast or irregular heartbeat; muscle twitching; stiff muscles; trouble walking; sweating; high fever; seizures; chills; vomiting)  trouble breathing Side effects that usually do not require medical attention (report to your doctor or health care professional if they continue or are bothersome):  constipation  dry mouth  nausea, vomiting  tiredness This list may not describe all possible side effects. Call your doctor for medical advice about side effects. You may report side effects to FDA at 1-800-FDA-1088. Where should I keep my medicine? Keep out of the reach of children and pets. This medicine can be abused. Keep it in a safe place to protect it from theft. Do not share  it with anyone. It is only for you. Selling or giving away this medicine is dangerous and against the law. Store at [employer] [address] (77 degrees F). Protect from light and moisture. Keep the container tightly closed. Get rid of any unused medicine after the expiration date. This medicine may cause harm and death if it is taken by other adults, children, or pets. It is important to get rid of the medicine as soon as you no longer need it or it is expired. You can do this in two ways:  Take the medicine to a medicine take-back program. Check with your pharmacy or law enforcement to find a location.  If you cannot return the medicine, flush it down the toilet. NOTE: This sheet is a summary. It may not cover all possible information. If you have questions about this medicine, talk to your doctor, pharmacist, or health care provider.  2021 Elsevier/Gold Standard (2020-10-24 13:26:06)

## 2021-04-24 NOTE — Progress Notes (Signed)
[facility]  Notified friend Richardson Landry patient done with surgery. Rx sent in. Will see in 4 weeks. Ice to the area for 72 hours. Tylenol and ibuprofen alternating and Roxi for breakthrough pain.  Towel under scrotum to help with swelling. No heavy lifting > 10 lbs, excessive bending, pushing, pulling, or squatting for 6-8 weeks after surgery.   Will need void before discharge home.   Curlene Labrum, MD [facility] [address] [place], [place] [address] [phone] (office)

## 2021-04-24 NOTE — Anesthesia Procedure Notes (Signed)
Procedure Name: Intubation Date/Time: 04/24/2021 12:07 PM Performed by: Jonna Munro, CRNA Pre-anesthesia Checklist: Patient identified, Emergency Drugs available, Suction available, Patient being monitored and Timeout performed Patient Re-evaluated:Patient Re-evaluated prior to induction Oxygen Delivery Method: Circle system utilized Preoxygenation: Pre-oxygenation with 100% oxygen Induction Type: IV induction Laryngoscope Size: Mac and 3 Grade View: Grade I Tube type: Oral Number of attempts: 1 Airway Equipment and Method: Stylet Placement Confirmation: ETT inserted through vocal cords under direct vision,  positive ETCO2 and breath sounds checked- equal and bilateral Secured at: 22 cm Tube secured with: Tape Dental Injury: Teeth and Oropharynx as per pre-operative assessment

## 2021-04-24 NOTE — Anesthesia Preprocedure Evaluation (Signed)
Anesthesia Evaluation  Patient identified by MRN, date of birth, ID band Patient awake    Reviewed: Allergy & Precautions, NPO status , Patient's Chart, lab work & pertinent test results  History of Anesthesia Complications Negative for: history of anesthetic complications  Airway Mallampati: II  TM Distance: >3 FB Neck ROM: Full    Dental  (+) Dental Advisory Given, Caps   Pulmonary former smoker,    Pulmonary exam normal breath sounds clear to auscultation       Cardiovascular Exercise Tolerance: Good hypertension, Pt. on medications Normal cardiovascular exam Rhythm:Regular Rate:Normal  21-Apr-2021 08:51:25 [facility] ROUTINE RECORD August 21, 1951 (11 yr) Male Caucasian Room: Loc:905 Technician: Test ind: Vent. rate 63 BPM PR interval 158 ms QRS duration 86 ms QT/QTcB 380/388 ms P-R-T axes 70 59 37 Normal sinus rhythm Normal ECG Confirmed by Dorris Carnes [phone]) on 04/21/2021 10:12:55 PM   Neuro/Psych negative neurological ROS  negative psych ROS   GI/Hepatic Neg liver ROS, hiatal hernia, GERD  Medicated and Controlled,  Endo/Other  negative endocrine ROS  Renal/GU negative Renal ROS     Musculoskeletal negative musculoskeletal ROS (+)   Abdominal   Peds  Hematology negative hematology ROS (+)   Anesthesia Other Findings   Reproductive/Obstetrics negative OB ROS                             Anesthesia Physical Anesthesia Plan  ASA: II  Anesthesia Plan: General   Post-op Pain Management:    Induction: Intravenous  PONV Risk Score and Plan: 3 and Ondansetron and Dexamethasone  Airway Management Planned: Oral ETT  Additional Equipment:   Intra-op Plan:   Post-operative Plan: Extubation in OR  Informed Consent: I have reviewed the patients History and Physical, chart, labs and discussed the procedure including the risks, benefits and alternatives  for the proposed anesthesia with the patient or authorized representative who has indicated his/her understanding and acceptance.     Dental advisory given  Plan Discussed with: CRNA and Surgeon  Anesthesia Plan Comments:         Anesthesia Quick Evaluation

## 2021-04-24 NOTE — Op Note (Signed)
[facility] Operative Note  04/24/21  Preoperative Diagnosis: Recurrent right inguinal hernia    Postoperative Diagnosis: Same   Procedure(s) Performed: Open Right inguinal hernia repair with mesh   Surgeon: Lanell Matar. Constance Haw, MD   Assistants: No qualified resident was available   Anesthesia: General endotracheal   Anesthesiologist: Denese Killings, MD    Specimens:  None   Estimated Blood Loss: Minimal   Blood Replacement: None    Complications: None   Wound Class: Clean    Operative Indications: Mr. Slivinski is a 70 yo who had a laparoscopic bilateral inguinal hernia repair and recurred on the right side. We discussed repair with an open procedure and risk of bleeding, infection, nerve injury, cord injury and recurrence. We discussed use of mesh.   Findings: Metal Tac from inside noted to be coming through on the lateral aspect of the conjoint tendon / transverse abdominis, indirect hernia    Procedure: The patient was taken to the operating room and placed supine. General endotracheal anesthesia was induced. Intravenous antibiotics were administered per protocol.  A time out was preformed verifying the correct patient, procedure, site, positioning and implants.  The right groin and scrotum were prepared and draped in the usual sterile fashion.   An incision was made in a natural skin crease between the pubic tubercle and the anterior superior iliac spine.  The incision was deepened with electrocautery through Scarpa's and Camper's fascia until the aponeurosis of the external oblique was encountered.  This was cleaned and the external ring was exposed.  An incision was made in the midportion of the external oblique aponeurosis in the direction of its fibers. The ilioinguinal nerve was identified and was protected throughout the dissection.  Flaps of the external oblique were developed cephalad and inferiorly.    The cord was identified and it was gently  dissented free at the pubic tubercle and encircled with a Penrose drain.  Attention was then directed at the anteromedial aspect of the cord, where an indirect hernia sac was identified.  The sac was carefully dissected free from the cord down to the level of the internal ring.  The vas and testicular vessels were identified and protected from harm.  Once the sac was dissected free from the cords, the Penrose was placed around the cord which was retracted inferiorly out of the field of view.  The hernia was reduced into the internal ring without difficulty.  A medium Perfix Plug was placed into the defect and filled the space.  Attention was then turned to the floor of the canal, which was grossly weakened without any defined defect or sac.  The Perfix Mesh Patch was sutured to the inguinal ligament inferiorly starting at the pubic tubercle using 2-0 Novafil interrupted sutures.  The mesh was sutured superiorly to the conjoint tendon using 2-0 Novafil interrupted sutures. On the lateral aspect of the conjoint to transverse abdominis transition there was a metal tac from the laparoscopic repair noted to be coming partially through. No nerves were impinged that I could not externally.  Care was taken to ensure the mesh was placed in a relaxed fashion to avoid excessive tension and no neurovascular structures were caught in the repair.  Laterally the tails of the mesh were crossed and the internal ring was recreated, allowing for passage of cords without tension.   Hemostasis was adequate after Valsalva performed.  The Penrose was removed.  The external oblique aponeurosis was closed with a 2-0 Vicryl suture in  a running fashion, taking care to not catch the ilioinguinal nerve in the suture line.  Scarpa's fashion was closed with 3-0 Vicryl interrupted sutures. The skin was closed with a subcuticular 4-0 Monocryl suture.  Dermabond was applied.   The testis was gently pulled down into its anatomic position in the  scrotum.  The patient tolerated the procedure well and was taken to the PACU in stable condition. All counts were correct at the end of the case.        Curlene Labrum, MD [facility] [address] [place], [place] [address] [phone] (office)

## 2021-04-24 NOTE — H&P (Signed)
[facility] History and Physical  Reason for Referral: Recurrent right inguinal hernia  Referring Physician: Dolores Patty, DO    Ronnie Patrick is a 70 y.o. male.  HPI:  Ronnie Patrick saw me a few weeks back with a recurrent right inguinal hernia after laparoscopic hernia repair of bilateral hernias November 2021. He wanted to see if someone would do a repeat laparoscopic repair and went to seek out other surgeons but could not find anyone that would do it.  He is also feeling a bulge on the left now. He has some pain in both groins. He is very frustrated with his recurrence. He has boarded his cats and says that given that he will need two operations for left and right recurrence he will probably have to get them adapted.   Past Medical History:  Diagnosis Date  . Basal cell carcinoma (BCC)    skin cancer on scalp-been removed  . Chronic pancreatitis ([place])   . Exocrine pancreatic insufficiency   . Gastritis   . GERD (gastroesophageal reflux disease)   . Hiatal hernia   . HTN (hypertension)   . Hyperlipidemia   . IBS (irritable bowel syndrome)   . Jock itch    LAMISIL/TERBINAFINE REQUIRED  . Lyme disease JUN 2016    Past Surgical History:  Procedure Laterality Date  . BIOPSY  02/20/2017   Procedure: BIOPSY;  Surgeon: Danie Binder, MD;  [facility];  Service: Endoscopy;;  gastric and esophageal  . BIOPSY  04/13/2019   Procedure: BIOPSY;  Surgeon: Danie Binder, MD;  [facility];  Service: Endoscopy;;  colon  . COLONOSCOPY  DEC 2015 [facility]   [facility]  . COLONOSCOPY WITH PROPOFOL N/A 04/13/2019   Procedure: COLONOSCOPY WITH PROPOFOL;  Surgeon: Danie Binder, MD;  [facility];  Service: Endoscopy;  Laterality: N/A;  8:30am  . ESOPHAGOGASTRODUODENOSCOPY N/A 02/20/2017   Procedure: ESOPHAGOGASTRODUODENOSCOPY (EGD);  Surgeon: Danie Binder, MD;  [facility];  Service: Endoscopy;  Laterality: N/A;  215  . EXCISIONAL  HEMORRHOIDECTOMY     AGE 63-PAINFUL  . FLEXIBLE SIGMOIDOSCOPY N/A 07/15/2015   Procedure: FLEXIBLE SIGMOIDOSCOPY;  Surgeon: Danie Binder, MD;  [facility];  Service: Endoscopy;  Laterality: N/A;  100 - moved to 12:00 - office to notify  . HEMORRHOID BANDING N/A 07/15/2015   Procedure: HEMORRHOID BANDING;  Surgeon: Danie Binder, MD;  [facility];  Service: Endoscopy;  Laterality: N/A;  . HERNIA REPAIR Bilateral 10/2020  . POLYPECTOMY  04/13/2019   Procedure: POLYPECTOMY;  Surgeon: Danie Binder, MD;  [facility];  Service: Endoscopy;;  colon  . TONSILLECTOMY AND ADENOIDECTOMY     AS A CHILD  . UPPER GASTROINTESTINAL ENDOSCOPY  DEC 2015 [facility]   GERD, HH    Family History  Problem Relation Age of Onset  . Allergic rhinitis Mother   . Eczema Mother   . Urticaria Mother   . Asthma Father   . Colon cancer Neg Hx   . Colon polyps Neg Hx     Social History   Tobacco Use  . Smoking status: Former Smoker    Packs/day: 1.00    Years: 34.00    Pack years: 34.00    Quit date: 07/14/2001    Years since quitting: 19.7  . Smokeless tobacco: Never Used  . Tobacco comment: Quit in 2002  Vaping Use  . Vaping Use: Never used  Substance Use  Topics  . Alcohol use: No    Alcohol/week: 0.0 standard drinks  . Drug use: No    Medications: I have reviewed the patient's current medications. Medications Prior to Admission  Medication Sig Dispense Refill  . acetaminophen (TYLENOL) 500 MG tablet Take 500 mg by mouth 2 (two) times daily.    Marland Kitchen acyclovir (ZOVIRAX) 400 MG tablet Take 400 mg by mouth 2 (two) times daily.    Marland Kitchen atorvastatin (LIPITOR) 40 MG tablet Take 40 mg by mouth daily.    . calcium carbonate (TUMS EX) 750 MG chewable tablet Chew 1 tablet by mouth 3 (three) times daily as needed for heartburn.    . camphor-menthol (SARNA) lotion Apply 1 application topically 2 (two) times daily as needed for itching.    . fexofenadine (ALLEGRA) 180 MG tablet  Take 180 mg by mouth at bedtime.    . fluticasone (FLONASE) 50 MCG/ACT nasal spray Place 1 spray into both nostrils 2 (two) times daily.    Marland Kitchen HYDROcodone-acetaminophen (NORCO) 10-325 MG tablet Take 1 tablet by mouth every 6 (six) hours as needed.    . meloxicam (MOBIC) 15 MG tablet Take 15 mg by mouth daily.    . metoprolol tartrate (LOPRESSOR) 25 MG tablet Take 25 mg by mouth 2 (two) times daily. Hold evening dose if SBP <120    . Multiple Vitamin (MULTIVITAMIN WITH MINERALS) TABS tablet Take 1 tablet by mouth daily.    . naproxen sodium (ALEVE) 220 MG tablet Take 220 mg by mouth.    . pantoprazole (PROTONIX) 40 MG tablet Take 1 tablet (40 mg total) by mouth daily before breakfast. 90 tablet 3  . polyethylene glycol (MIRALAX / GLYCOLAX) 17 g packet Take 17 g by mouth daily.    . polyvinyl alcohol (LIQUIFILM TEARS) 1.4 % ophthalmic solution Place 1 drop into both eyes every 4 (four) hours as needed for dry eyes.    Marland Kitchen senna (SENOKOT) 8.6 MG TABS tablet Take 1-4 tablets by mouth at bedtime as needed for mild constipation.    . sildenafil (VIAGRA) 100 MG tablet Take 100 mg by mouth daily as needed for erectile dysfunction.    . sodium chloride (OCEAN) 0.65 % SOLN nasal spray Place 1 spray into both nostrils as needed for congestion.    . traMADol (ULTRAM) 50 MG tablet Take 1 tablet (50 mg total) by mouth every 6 (six) hours as needed. (Patient taking differently: Take 50 mg by mouth every 6 (six) hours as needed for moderate pain.) 15 tablet 0  . VENTOLIN HFA 108 (90 BASE) MCG/ACT inhaler Inhale 2 puffs into the lungs every 4 (four) hours as needed for wheezing or shortness of breath.      Allergies  Allergen Reactions  . Bacitracin Hives  . Flagyl [Metronidazole] Itching  . Neosporin [Neomycin-Bacitracin Zn-Polymyx] Hives  . Penicillins Itching    Has patient had a PCN reaction causing immediate rash, facial/tongue/throat swelling, SOB or lightheadedness with hypotension: No Has patient had a  PCN reaction causing severe rash involving mucus membranes or skin necrosis: No Has patient had a PCN reaction that required hospitalization No Has patient had a PCN reaction occurring within the last 10 years: No If all of the above answers are "NO", then may proceed with Cephalosporin use.       ROS:  A comprehensive review of systems was negative except for: Gastrointestinal: positive for abdominal pain, nausea, reflux symptoms and pancreatitis, IBS, right and left groin pain Musculoskeletal: positive for back pain  and joint pain  Blood pressure (!) 143/77, pulse 67, temperature 98.1 F (36.7 C), temperature source Oral, resp. rate 18, SpO2 100 %. Physical Exam Vitals reviewed.  Constitutional:      Appearance: Normal appearance.  HENT:     Head: Normocephalic.  Eyes:     Pupils: Pupils are equal, round, and reactive to light.  Cardiovascular:     Rate and Rhythm: Normal rate.  Pulmonary:     Effort: Pulmonary effort is normal.  Abdominal:     General: There is no distension.     Palpations: Abdomen is soft.     Tenderness: There is no abdominal tenderness.     Hernia: A hernia is present. Hernia is present in the right inguinal area.     Comments: Marked   Musculoskeletal:        General: Normal range of motion.     Cervical back: Normal range of motion.  Skin:    General: Skin is warm.  Neurological:     General: No focal deficit present.     Mental Status: He is oriented to person, place, and time.  Psychiatric:        Mood and Affect: Mood normal.        Behavior: Behavior normal.        Thought Content: Thought content normal.        Judgment: Judgment normal.     Results: None   Assessment & Plan:  Ronnie Patrick is a 70 y.o. male with a recurrent right inguinal hernia and possible left. He feels a bulge and pain but I did not feel it on prior exam.  I did not examine the left today.  -Discussed the risk and benefits including, bleeding, infection, use of  mesh, risk of recurrence, risk of nerve damage causing numbness or changes in sensation, risk of damage to the cord structures. The patient understands the risk and benefits of repair with mesh, and has decided to proceed.  We also discussed open versus laparoscopic surgery and the use of mesh. We discussed that I do open repairs with mesh, and that this is considered equivalent to laparoscopic surgery. We discussed reasons for opting for laparoscopic surgery including if a bilateral repair is needed or if a patient has a recurrence after an open repair.  He could not find anyone to do a repeat laparoscopic repair. He wants to proceed with a right open repair now.  All questions were answered to the satisfaction of the patient .   Virl Cagey 04/24/2021, 11:52 AM

## 2021-04-25 ENCOUNTER — Encounter (HOSPITAL_COMMUNITY): Payer: Self-pay | Admitting: General Surgery

## 2021-04-26 ENCOUNTER — Telehealth (INDEPENDENT_AMBULATORY_CARE_PROVIDER_SITE_OTHER): Payer: Medicare HMO | Admitting: General Surgery

## 2021-04-26 DIAGNOSIS — K4091 Unilateral inguinal hernia, without obstruction or gangrene, recurrent: Secondary | ICD-10-CM

## 2021-04-26 MED ORDER — OXYCODONE HCL 5 MG PO TABS: 5.0000 mg | ORAL_TABLET | ORAL | 0 refills | Status: DC | PRN

## 2021-04-26 NOTE — Telephone Encounter (Signed)
[facility]   Patient with pain. Can take 5-10 mg Roxicodone q 4 PRN. Continue tylenol and ibuprofen.  Refill sent in.  Curlene Labrum, MD [facility] [address] [place], [place] [address] [phone] (office)

## 2021-05-01 ENCOUNTER — Telehealth: Payer: Self-pay | Admitting: General Surgery

## 2021-05-01 NOTE — Telephone Encounter (Signed)
Patient left a voice mail requesting that someone call him. He has a lump that has formed at surgical site that will not go down and he is very concerned about this. Stated that he is in a lot of pain and will run out of his pain medication in the next day or two. Would like a call back at [phone].

## 2021-05-02 ENCOUNTER — Telehealth (INDEPENDENT_AMBULATORY_CARE_PROVIDER_SITE_OTHER): Payer: Medicare HMO | Admitting: General Surgery

## 2021-05-02 DIAGNOSIS — K4091 Unilateral inguinal hernia, without obstruction or gangrene, recurrent: Secondary | ICD-10-CM

## 2021-05-02 MED ORDER — OXYCODONE HCL 5 MG PO TABS: 5.0000 mg | ORAL_TABLET | ORAL | 0 refills | Status: DC | PRN

## 2021-05-02 NOTE — Telephone Encounter (Signed)
Apt made

## 2021-05-02 NOTE — Telephone Encounter (Signed)
[facility]  Patients he has a large lump in the groin area. The pain medication is not helping he says. He is hurting very badly.   He is taking naproxen and tylenol and is needing more pain medication.  He is worried because of the swelling.   I will see him on Thursday at 3pm because he will have to [place] down from [place]. I have refilled his Roxicodone.   Curlene Labrum, MD [facility] [address] [place], [place] [address] [phone] (office)

## 2021-05-02 NOTE — Telephone Encounter (Signed)
See note 05/02/21

## 2021-05-04 ENCOUNTER — Ambulatory Visit (INDEPENDENT_AMBULATORY_CARE_PROVIDER_SITE_OTHER): Payer: Medicare HMO | Admitting: General Surgery

## 2021-05-04 ENCOUNTER — Other Ambulatory Visit: Payer: Self-pay

## 2021-05-04 ENCOUNTER — Encounter: Payer: Self-pay | Admitting: General Surgery

## 2021-05-04 VITALS — BP 169/80 | HR 82 | Temp 98.4°F | Resp 12 | Ht 66.0 in | Wt 141.0 lb

## 2021-05-04 DIAGNOSIS — K4091 Unilateral inguinal hernia, without obstruction or gangrene, recurrent: Secondary | ICD-10-CM

## 2021-05-04 DIAGNOSIS — S301XXA Contusion of abdominal wall, initial encounter: Secondary | ICD-10-CM | POA: Insufficient documentation

## 2021-05-04 MED ORDER — HYDROMORPHONE HCL 4 MG PO TABS: 2.0000 mg | ORAL_TABLET | Freq: Four times a day (QID) | ORAL | 0 refills | Status: DC | PRN

## 2021-05-04 MED ORDER — HYDROMORPHONE HCL 2 MG PO TABS: 2.0000 mg | ORAL_TABLET | Freq: Four times a day (QID) | ORAL | 0 refills | Status: DC | PRN

## 2021-05-04 NOTE — Patient Instructions (Signed)
Take gabapentin 100mg  three times a day for pain control. Take dilaudid for breakthrough pain, do not mix with gabapentin.   Hematoma A hematoma is a collection of blood under the skin, in an organ, in a body space, in a joint space, or in other tissue. The blood can thicken (clot) to form a lump that you can see and feel. The lump is often firm and may become sore and tender. Most hematomas get better in a few days to weeks. However, some hematomas may be serious and require medical care. Hematomas can range from very small to very large. What are the causes? This condition is caused by:  A blunt or penetrating injury.  A leakage from a blood vessel under the skin.  Some medical procedures, including surgeries, such as oral surgery, face lifts, and surgeries on the joints.  Some medical conditions that cause bleeding or bruising. There may be multiple hematomas that appear in different areas of the body. What increases the risk? You are more likely to develop this condition if:  You are an older adult.  You use blood thinners. What are the signs or symptoms? Symptoms of this condition depend on where the hematoma is located.  Common symptoms of a hematoma that is under the skin include:  A firm lump on the body.  Pain and tenderness in the area.  Bruising. Blue, dark blue, purple-red, or yellowish skin (discoloration) may appear at the site of the hematoma if the hematoma is close to the surface of the skin. Common symptoms of a hematoma that is deep in the tissues or body spaces may be less obvious. They include:  A collection of blood in the stomach (intra-abdominal hematoma). This may cause pain in the abdomen, weakness, fainting, and shortness of breath.  A collection of blood in the head (intracranial hematoma). This may cause a headache or symptoms such as weakness, trouble speaking or understanding, or a change in consciousness.   How is this diagnosed? This condition is  diagnosed based on:  Your medical history.  A physical exam.  Imaging tests, such as an ultrasound or CT scan. These may be needed if your health care provider suspects a hematoma in deeper tissues or body spaces.  Blood tests. These may be needed if your health care provider believes that the hematoma is caused by a medical condition. How is this treated? Treatment for this condition depends on the cause, size, and location of the hematoma. Treatment may include:  Doing nothing. The majority of hematomas do not need treatment as many of them go away on their own over time.  Surgery or close monitoring. This may be needed for large hematomas or hematomas that affect vital organs.  Medicines. Medicines may be given if there is an underlying medical cause for the hematoma. Follow these instructions at home: Managing pain, stiffness, and swelling  If directed, apply heat to the affected area after applying ice for a couple of days. Use the heat source that your health care provider recommends, such as a moist heat pack or a heating pad. ? Place a towel between your skin and the heat source. ? Leave the heat on for 20-30 minutes. ? Remove the heat if your skin turns bright red. This is especially important if you are unable to feel pain, heat, or cold. You may have a greater risk of getting burned.  Raise (elevate) the affected area above the level of your heart while you are sitting or lying  down.  If told, wrap the affected area with an elastic bandage. The bandage applies pressure (compression) to the area, which may help to reduce swelling and promote healing. Do not wrap the bandage too tightly around the affected area.  If your hematoma is on a leg or foot (lower extremity) and is painful, your health care provider may recommend crutches. Use them as told by your health care provider.   General instructions  Take over-the-counter and prescription medicines only as told by your  health care provider.  Keep all follow-up visits as told by your health care provider. This is important. Contact a health care provider if:  You have a fever.  The swelling or discoloration gets worse.  You develop more hematomas. Get help right away if:  Your pain is worse or your pain is not controlled with medicine.  Your skin over the hematoma breaks or starts bleeding.  Your hematoma is in your chest or abdomen and you have weakness, shortness of breath, or a change in consciousness.  You have a hematoma on your scalp that is caused by a fall or injury, and you also have: ? A headache that gets worse. ? Trouble speaking or understanding speech. ? Weakness. ? Change in alertness or consciousness. Summary  A hematoma is a collection of blood under the skin, in an organ, in a body space, in a joint space, or in other tissue.  This condition usually does not need treatment because many hematomas go away on their own over time.  Large hematomas, or those that may affect vital organs, may need surgical drainage or monitoring. If the hematoma is caused by a medical condition, medicines may be prescribed.  Get help right away if your hematoma breaks or starts to bleed, you have shortness of breath, or you have a headache or trouble speaking after a fall. This information is not intended to replace advice given to you by your health care provider. Make sure you discuss any questions you have with your health care provider. Document Revised: 04/29/2019 Document Reviewed: 05/08/2018 Elsevier Patient Education  2021 [employer].

## 2021-05-04 NOTE — Progress Notes (Signed)
[facility] Note   HPI:  70 y.o. Male presents to clinic for post-op follow-up evaluation after right inguinal hernia repair with mesh. Patient reports he is having severe pain and is very bruised. He has been trying the roxicodone without relief.  Review of Systems:  No fevers or chills Bruising No drainage All other review of systems: otherwise negative   Vital Signs:  BP (!) 169/80   Pulse 82   Temp 98.4 F (36.9 C) (Other (Comment))   Resp 12   Ht 5\' 6"  (1.676 m)   Wt 141 lb (64 kg)   SpO2 96%   BMI 22.76 kg/m    Physical Exam:  Physical Exam Vitals reviewed.  Cardiovascular:     Rate and Rhythm: Normal rate.  Pulmonary:     Effort: Pulmonary effort is normal.  Abdominal:     Comments: Right inguinal incision c/d/i with dermabond, extensive bruising and hematoma onto the abdominal wall and down to hip  Neurological:     Mental Status: He is alert.         Assessment:  70 y.o. yo Male with a large hematoma post op. This is causing his additional pain.  Plan:  Take gabapentin 100mg  three times a day for pain control. Take dilaudid 2mg  q 6 PRN for breakthrough pain, do not mix with gabapentin.  Heating pad to area  Will check on next week   Future Appointments  Date Time Provider [place]  05/11/2021  2:15 PM Virl Cagey, MD [facility] None  05/23/2021 10:45 AM Virl Cagey, MD [facility] None      Curlene Labrum, MD [facility] [address] [place], [place] [address] [phone] (office)

## 2021-05-05 ENCOUNTER — Telehealth (INDEPENDENT_AMBULATORY_CARE_PROVIDER_SITE_OTHER): Payer: Medicare HMO | Admitting: General Surgery

## 2021-05-05 ENCOUNTER — Other Ambulatory Visit: Payer: Self-pay | Admitting: Family Medicine

## 2021-05-05 DIAGNOSIS — R109 Unspecified abdominal pain: Secondary | ICD-10-CM

## 2021-05-05 DIAGNOSIS — K4091 Unilateral inguinal hernia, without obstruction or gangrene, recurrent: Secondary | ICD-10-CM

## 2021-05-05 DIAGNOSIS — S301XXA Contusion of abdominal wall, initial encounter: Secondary | ICD-10-CM

## 2021-05-05 MED ORDER — GABAPENTIN 100 MG PO CAPS
100.0000 mg | ORAL_CAPSULE | Freq: Three times a day (TID) | ORAL | 1 refills | Status: DC
Start: 2021-05-05 — End: 2021-05-05

## 2021-05-05 MED ORDER — GABAPENTIN 100 MG PO CAPS: 100.0000 mg | ORAL_CAPSULE | Freq: Three times a day (TID) | ORAL | 1 refills | Status: DC

## 2021-05-05 NOTE — Telephone Encounter (Signed)
[facility]  Patient had 300mg  gabapentin tablets at home not 100mg  . Sent in 100mg  TID Rx.  Do not mix with narcotic.  Curlene Labrum, MD [facility] [address] [place], [place] [address] [phone] (office)

## 2021-05-11 ENCOUNTER — Other Ambulatory Visit: Payer: Self-pay

## 2021-05-11 ENCOUNTER — Encounter: Payer: Self-pay | Admitting: General Surgery

## 2021-05-11 ENCOUNTER — Ambulatory Visit (INDEPENDENT_AMBULATORY_CARE_PROVIDER_SITE_OTHER): Payer: Medicare HMO | Admitting: General Surgery

## 2021-05-11 VITALS — BP 155/79 | HR 74 | Temp 98.4°F | Resp 12 | Ht 66.0 in | Wt 139.0 lb

## 2021-05-11 DIAGNOSIS — S301XXA Contusion of abdominal wall, initial encounter: Secondary | ICD-10-CM

## 2021-05-11 DIAGNOSIS — R109 Unspecified abdominal pain: Secondary | ICD-10-CM

## 2021-05-11 DIAGNOSIS — K4091 Unilateral inguinal hernia, without obstruction or gangrene, recurrent: Secondary | ICD-10-CM

## 2021-05-11 MED ORDER — LIDOCAINE 5 % EX PTCH: 1.0000 | MEDICATED_PATCH | Freq: Two times a day (BID) | CUTANEOUS | 0 refills | Status: DC

## 2021-05-11 MED ORDER — HYDROMORPHONE HCL 2 MG PO TABS: 2.0000 mg | ORAL_TABLET | Freq: Four times a day (QID) | ORAL | 0 refills | Status: DC | PRN

## 2021-05-11 NOTE — Patient Instructions (Signed)
Can increase the gabapentin up to 200 mg three times a day (take two of the 100 mg and ultimately up to 300 mg a day using your other gabapentin doses).  Refill of the dilaudid.  Ok to drive if not on narcotics and can steer/ slam on breaks etc.  Do not mix gabapentin and narcotic. Lidocaine patch 5% over the counter over the area.

## 2021-05-11 NOTE — Progress Notes (Signed)
[facility]   HPI:  70 y.o. Male presents to clinic for follow-up evaluation of his right recurrent inguinal hernia repair with mesh and post op hematoma. He is still having pain. He is trying the gabapentin 100 mg but says that he cannot tell if it works. He has 300 mg tablets at home from being on it previously. He says that the dilaudid did help initially but seems to have less effect now. He is eating and drinking and having Bms.   Review of Systems:  Bruising evolving  All other review of systems: otherwise negative   Vital Signs:  BP (!) 155/79   Pulse 74   Temp 98.4 F (36.9 C) (Other (Comment))   Resp 12   Ht 5\' 6"  (1.676 m)   Wt 139 lb (63 kg)   SpO2 99%   BMI 22.44 kg/m    Physical Exam:  Physical Exam Vitals reviewed.  Cardiovascular:     Rate and Rhythm: Normal rate.  Pulmonary:     Effort: Pulmonary effort is normal.  Abdominal:     General: There is no distension.     Palpations: Abdomen is soft.     Tenderness: There is abdominal tenderness.     Comments: Right inguinal incision c/d/i with dermabond, bruising evolvoing, hematoma superior to incision is 5X6cm in size, soft, bruising evolving, tender  Neurological:     Mental Status: He is alert.        Assessment:  70 y.o. yo Male with post op hematoma after right inguinal hernia repair with mesh for recurrent hernia. Doing fair but still with pain.  Plan:  Lidocaine patches to see if they help with the pain, over the counter if not able to get 5% patch Dilaudid 2mg  q6 PRN refilled   Future Appointments  Date Time Provider [place]  05/23/2021 10:45 AM Virl Cagey, MD [facility] None     Curlene Labrum, MD [facility] [address] [place], [place] [address] [phone] (office)

## 2021-05-23 ENCOUNTER — Ambulatory Visit (INDEPENDENT_AMBULATORY_CARE_PROVIDER_SITE_OTHER): Payer: Medicare HMO | Admitting: General Surgery

## 2021-05-23 ENCOUNTER — Encounter: Payer: Self-pay | Admitting: General Surgery

## 2021-05-23 ENCOUNTER — Other Ambulatory Visit: Payer: Self-pay

## 2021-05-23 VITALS — BP 149/84 | HR 64 | Temp 98.5°F | Resp 14 | Ht 66.0 in | Wt 142.0 lb

## 2021-05-23 DIAGNOSIS — S301XXA Contusion of abdominal wall, initial encounter: Secondary | ICD-10-CM

## 2021-05-23 DIAGNOSIS — K4091 Unilateral inguinal hernia, without obstruction or gangrene, recurrent: Secondary | ICD-10-CM

## 2021-05-23 NOTE — Patient Instructions (Signed)
Continue to wean off naproxen and then tylenol. Increase activity but still avoid lifting.

## 2021-05-23 NOTE — Progress Notes (Signed)
[facility] Note   HPI:  70 y.o. Male presents to clinic for follow-up evaluation of his right recurrent inguinal hernia s/p repair with post op hematoma. Patient reports doing fair and is off the gabapentin and dilaudid. He says he is about the same as far as soreness with or without those.   Review of Systems:  Sore Improving bruising and swelling  All other review of systems: otherwise negative   Vital Signs:  BP (!) 149/84   Pulse 64   Temp 98.5 F (36.9 C) (Other (Comment))   Resp 14   Ht 5\' 6"  (1.676 m)   Wt 142 lb (64.4 kg)   SpO2 100%   BMI 22.92 kg/m    Physical Exam:  Physical Exam Cardiovascular:     Rate and Rhythm: Normal rate.  Pulmonary:     Effort: Pulmonary effort is normal.  Abdominal:     General: There is no distension.     Palpations: Abdomen is soft.     Tenderness: There is abdominal tenderness.     Comments: Right inguinal hernia incision healed, dermabond peeling, no erythema or drainage, bruising on abdominal wall greatly improved, hematoma about 4X3cm in size, shrinking, no signs of infection  Neurological:     Mental Status: He is alert.     Assessment:  70 y.o. yo Male with hematoma after recurrent inguinal hernia repair with mesh. Improving greatly.  Plan:  Continue to wean off naproxen and then tylenol. Increase activity but still avoid lifting.  If he wants to assess the left more for recurrence, will likely need to get a Ct scan as I could not feel anything on exam previously and he just reports pain   Future Appointments  Date Time Provider [place]  06/29/2021  1:30 PM Virl Cagey, MD [facility] None    Curlene Labrum, MD [facility] [address] [place], [place] [address] [phone] (office)

## 2021-06-29 ENCOUNTER — Encounter: Payer: Self-pay | Admitting: General Surgery

## 2021-06-29 ENCOUNTER — Ambulatory Visit (INDEPENDENT_AMBULATORY_CARE_PROVIDER_SITE_OTHER): Payer: Medicare HMO | Admitting: General Surgery

## 2021-06-29 ENCOUNTER — Other Ambulatory Visit: Payer: Self-pay

## 2021-06-29 VITALS — BP 151/80 | HR 61 | Temp 98.3°F | Resp 16 | Ht 66.0 in | Wt 145.0 lb

## 2021-06-29 DIAGNOSIS — R1032 Left lower quadrant pain: Secondary | ICD-10-CM

## 2021-06-29 DIAGNOSIS — K4091 Unilateral inguinal hernia, without obstruction or gangrene, recurrent: Secondary | ICD-10-CM

## 2021-06-29 DIAGNOSIS — S301XXA Contusion of abdominal wall, initial encounter: Secondary | ICD-10-CM

## 2021-06-29 NOTE — Progress Notes (Signed)
[facility] Note   HPI:  70 y.o. Male presents to clinic for post-op follow-up evaluation of his right inguinal hernia who had issues with post operative hematoma. He is doing well and off the pain meds. He is having less swelling and the hematoma is down in size.   Review of Systems:  Soreness at times Resolving hematoma  All other review of systems: otherwise negative   Vital Signs:  BP (!) 151/80   Pulse 61   Temp 98.3 F (36.8 C) (Other (Comment))   Resp 16   Ht 5\' 6"  (1.676 m)   Wt 145 lb (65.8 kg)   SpO2 97%   BMI 23.40 kg/m    Physical Exam:  Physical Exam HENT:     Head: Normocephalic.  Cardiovascular:     Rate and Rhythm: Normal rate.  Pulmonary:     Effort: Pulmonary effort is normal.  Abdominal:     General: There is no distension.     Palpations: Abdomen is soft.     Tenderness: There is abdominal tenderness.     Hernia: A hernia is present. Hernia is present in the left inguinal area.     Comments: Right inguinal hernia site healing, hematoma superiorly on the abdominal wall shrinking in size, < 3cm now, mildly tender, tender in the left groin, possible inguinal hernia recurrence versus fatty cord on the left       Assessment:  70 y.o. yo Male with recurrent right inguinal hernia s/p open repair with mesh and now with concern for left inguinal recurrence. Given the difficulty in exam and prior bilateral laparoscopic repair will try to get a CT prior to look for a hernia.   Plan:  CT ordered. Ok to do without oral contrast. Will call with results. Activity as tolerated.   Information for PCP given to patient  [place],  [place]  [address] [place], [place] [address] [place], [place] [address] [phone] (office)

## 2021-06-29 NOTE — Patient Instructions (Addendum)
CT ordered. Ok to do without oral contrast. Will call with results. Activity as tolerated.   [place] [address] [place],  [place]  [address] [phone]

## 2021-07-03 ENCOUNTER — Telehealth: Payer: Self-pay | Admitting: Family Medicine

## 2021-07-03 NOTE — Telephone Encounter (Signed)
Prior Authorization for CT  A/P w/contrast, [employer] MCR ins, approved from 07/03/2021 and expires 12/30/2021. CPT: 97673 DX Code: R10.32, A19.37 Auth# - [id number]  CT scheduled for 07/25/2021 at [facility] [place]. [facility] Dr. Constance Haw PT DOES NOT NEED ORAL CONTRAST JUST IV CONTRAST)   Pt is aware via phone conservation.

## 2021-07-25 ENCOUNTER — Other Ambulatory Visit: Payer: Self-pay

## 2021-07-25 ENCOUNTER — Ambulatory Visit (HOSPITAL_COMMUNITY)
Admission: RE | Admit: 2021-07-25 | Discharge: 2021-07-25 | Disposition: A | Discharge status: Home / Self Care | Payer: Medicare HMO | Source: Ambulatory Visit | Attending: General Surgery | Admitting: General Surgery

## 2021-07-25 DIAGNOSIS — R1032 Left lower quadrant pain: Secondary | ICD-10-CM | POA: Diagnosis present

## 2021-07-25 LAB — POCT I-STAT CREATININE: Creatinine, Ser: 0.8 mg/dL (ref 0.61–1.24)

## 2021-07-25 MED ORDER — IOHEXOL 350 MG/ML SOLN
85.0000 mL | Freq: Once | INTRAVENOUS | Status: AC | PRN
  Administered 2021-07-25: 85 mL via INTRAVENOUS

## 2021-08-01 ENCOUNTER — Telehealth (INDEPENDENT_AMBULATORY_CARE_PROVIDER_SITE_OTHER): Payer: Medicare HMO | Admitting: General Surgery

## 2021-08-01 ENCOUNTER — Other Ambulatory Visit: Payer: Self-pay | Admitting: Family Medicine

## 2021-08-01 DIAGNOSIS — R1032 Left lower quadrant pain: Secondary | ICD-10-CM

## 2021-08-01 DIAGNOSIS — N3090 Cystitis, unspecified without hematuria: Secondary | ICD-10-CM

## 2021-08-01 NOTE — Telephone Encounter (Signed)
[facility]  Patient says he is urinating significant like 12-14. Will send in a UA and culture. He has frequency but no burning.   Discussed no hernia on the left and seroma on the right. Discussed that he has known spinal stenosis and he is on meloxicam for back pain. He has known adrenal nodule that is also stable. He is not having any symptoms of numbness or claudication for his back.   The left sided pain could be from the bladder or the hernia repair he had laparoscopically. He says at this only bothers him on occasion. Discussed potential option of nerve block in future if needed. Discussed if symptoms persist and UA negative may need referral to Urology for bladder wall thickening but that it is circumferential so that is reassuring.   Will order a UA and Urinalysis from [facility] and the patient will come to the one closest to our office.  Curlene Labrum, MD [facility] [address] [place], [place] [address] [phone] (office)

## 2021-08-01 NOTE — Telephone Encounter (Signed)
[facility]  Left a message regarding CT results. No hernia on CT, Known seroma that is decreasing in size, and findings of some lumbar stenosis. Will call him back to discuss things further.  CLINICAL DATA:  Abdominal pain, hernia suspected, left quadrant abdominal pain, concern for recurrent left inguinal hernia. History of laparoscopic bilateral hernia repair 11/05/2020 and open right hernia repair revision 0000000 complicated by postoperative hematoma.   EXAM: CT ABDOMEN AND PELVIS WITH CONTRAST   TECHNIQUE: Multidetector CT imaging of the abdomen and pelvis was performed using the standard protocol following bolus administration of intravenous contrast.   CONTRAST:  46m OMNIPAQUE IOHEXOL 350 MG/ML SOLN   COMPARISON:  CT 09/20/2020   FINDINGS: Lower chest: Minimal atelectatic changes otherwise clear lung bases. Normal heart size. No pericardial effusion. Minimal atherosclerotic calcification of coronary.   Hepatobiliary: No worrisome focal liver lesions. Smooth liver surface contour. Normal hepatic attenuation. Gallbladder decompressed no pericholecystic fluid or inflammation. No biliary ductal dilatation or visible intraductal gallstones.   Pancreas: Few pancreatic parenchymal calcifications on a background of partial fatty replacement of the pancreas. No pancreatic ductal dilatation or surrounding inflammatory changes.   Spleen: Normal in size. No concerning splenic lesions.   Adrenals/Urinary Tract: Continued stability of a an intermediate attenuation (60 HU) 2.2 cm nodule in the body of the right adrenal gland. No concerning left adrenal nodules. Partially exophytic fluid attenuation cyst measuring 2.8 cm seen in the upper pole left kidney. Additional smaller 1.6 cm cyst more inferiorly. No concerning focal renal lesions. No urolithiasis or hydronephrosis. Mild circumferential bladder wall thickening slightly greater than expected for  underdistention. Faint perivesicular hazy stranding.   Stomach/Bowel: Distal esophagus, stomach and duodenum are unremarkable with a normal small bowel course across the midline abdomen. No small bowel thickening or dilatation. No colonic dilatation or wall thickening.   Vascular/Lymphatic: Atherosclerotic calcifications within the abdominal aorta and branch vessels. No aneurysm or ectasia. No enlarged abdominopelvic lymph nodes.   Reproductive: The prostate and seminal vesicles are unremarkable.   Other: Postsurgical changes of the low anterior abdominal wall from prior bilateral inguinal mesh hernia repair. Small round 3.5 by 2.3 cm thick-walled collection in the right inguinal region may reflect residual hematoma or seroma from prior open inguinal hernia repair. No evidence of recurrent inguinal herniation or bowel containing hernia elsewhere in the abdomen or pelvis. No abdominopelvic free air or fluid.   Musculoskeletal: Multilevel degenerative changes are present in the imaged portions of the spine. Bony fusion across the L2-3 levels. Grade 1 anterolisthesis L3 on L4 and L4 on L5. No spondylolysis. No abnormally widened, jumped or perched facets. Multilevel discogenic and facet degenerative changes maximal at the L3-4 level resulting in severe canal stenosis and bilateral foraminal narrowing. No other acute or significant osseous abnormalities. Further degenerative changes in the hips and pelvis.   IMPRESSION: 1. Postsurgical changes from bilateral inguinal hernia repair and right inguinal revision with a thick-walled collection in the more superficial right inguinal soft tissues measuring 3.5 x 2.3 cm in size, likely a postoperative hematoma or seroma with superinfection less favored though cannot completely excluded on an imaging basis alone. No recurrent herniation is seen. 2. Circumferential thickening of bladder wall and mild perivesicular hazy stranding. Correlate  with urinalysis to exclude cystitis. 3. Punctate calcifications of the pancreas, can be seen as sequela of chronic pancreatitis. 4. 2.2 cm nodule in the right adrenal gland is stable since 09/20/2020 and favored to reflect a benign adrenal adenoma.   Electronically  Signed: By: Lovena Le M.D. On: 07/26/2021 02:27  ADDENDUM REPORT: 07/26/2021 02:33   ADDENDUM: Additional impression point:   Bony fusion of the L2-3 levels. 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     Electronically Signed   By: Lovena Le M.D.   On: 07/26/2021 02:33

## 2021-08-03 LAB — URINALYSIS, ROUTINE W REFLEX MICROSCOPIC
Bilirubin, UA: NEGATIVE
Glucose, UA: NEGATIVE
Ketones, UA: NEGATIVE
Leukocytes,UA: NEGATIVE
Nitrite, UA: NEGATIVE
Protein,UA: NEGATIVE
RBC, UA: NEGATIVE
Specific Gravity, UA: 1.012 (ref 1.005–1.030)
Urobilinogen, Ur: 0.2 mg/dL (ref 0.2–1.0)
pH, UA: 6.5 (ref 5.0–7.5)

## 2021-08-04 LAB — URINE CULTURE: Organism ID, Bacteria: NO GROWTH

## 2021-10-02 ENCOUNTER — Other Ambulatory Visit: Payer: Self-pay

## 2021-10-02 ENCOUNTER — Encounter: Payer: Self-pay | Admitting: Urology

## 2021-10-02 ENCOUNTER — Ambulatory Visit: Payer: Medicare HMO | Admitting: Urology

## 2021-10-02 VITALS — BP 153/87 | HR 71 | Temp 99.1°F | Wt 149.8 lb

## 2021-10-02 DIAGNOSIS — N401 Enlarged prostate with lower urinary tract symptoms: Secondary | ICD-10-CM | POA: Diagnosis not present

## 2021-10-02 DIAGNOSIS — N138 Other obstructive and reflux uropathy: Secondary | ICD-10-CM | POA: Diagnosis not present

## 2021-10-02 DIAGNOSIS — R351 Nocturia: Secondary | ICD-10-CM | POA: Diagnosis not present

## 2021-10-02 LAB — URINALYSIS, ROUTINE W REFLEX MICROSCOPIC
Bilirubin, UA: NEGATIVE
Glucose, UA: NEGATIVE
Ketones, UA: NEGATIVE
Leukocytes,UA: NEGATIVE
Nitrite, UA: NEGATIVE
Protein,UA: NEGATIVE
RBC, UA: NEGATIVE
Specific Gravity, UA: 1.015 (ref 1.005–1.030)
Urobilinogen, Ur: 0.2 mg/dL (ref 0.2–1.0)
pH, UA: 7 (ref 5.0–7.5)

## 2021-10-02 MED ORDER — TADALAFIL 5 MG PO TABS: 5.0000 mg | ORAL_TABLET | Freq: Every day | ORAL | 11 refills | Status: DC

## 2021-10-02 NOTE — Progress Notes (Signed)
Urological Symptom Review  Patient is experiencing the following symptoms: Frequent urination Get up at night to urinate Sexually transmitted disease Erection problems (male only)   Review of Systems  Gastrointestinal (upper)  : Indigestion/heartburn  Gastrointestinal (lower) : Diarrhea Constipation  Constitutional : Weight loss  Skin: Negative for skin symptoms  Eyes: Negative for eye symptoms  Ear/Nose/Throat : Negative for Ear/Nose/Throat symptoms  Hematologic/Lymphatic: Negative for Hematologic/Lymphatic symptoms  Cardiovascular : Negative for cardiovascular symptoms  Respiratory : Negative for respiratory symptoms  Endocrine: Negative for endocrine symptoms  Musculoskeletal: Back pain  Neurological: Negative for neurological symptoms  Psychologic: Depression Anxiety

## 2021-10-02 NOTE — Progress Notes (Signed)
post void residual = 0 ml

## 2021-10-02 NOTE — Progress Notes (Signed)
10/02/2021 1:16 PM   Ronnie Patrick 1951-08-29 [id number]  Referring provider: Dolores Patty, DO [place]; [place] [address]  No chief complaint on file.   HPI:  New patient-  1) BPH-patient with symptoms of frequency day and night. Drinks 78 oz and no caffeine. Drinks up to bedtime. He does snore, but says he was checked for OSA and doesn't have it. He's woken himself up snoring.  Good stream. Void diary 12-14 x in 24 hrs. Noc x 3-5. Going on for 1-2 years. CT a/p 08/22 with a 40 g prostate and mild BWT.   No prior GU history or surgery. No NG risk. No gross hematuria.   He does have ED and takes sildenafil.  Today, seen for the above. UA clear.   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   [place]: Past Medical History:  Diagnosis Date   Basal cell carcinoma (BCC)    skin cancer on scalp-been removed   Chronic pancreatitis ([place])    Exocrine pancreatic insufficiency    Gastritis    GERD (gastroesophageal reflux disease)    Hiatal hernia    HTN (hypertension)    Hyperlipidemia    IBS (irritable bowel syndrome)    Jock itch    LAMISIL/TERBINAFINE REQUIRED   Lyme disease JUN 2016    Surgical History: Past Surgical History:  Procedure Laterality Date   BIOPSY  02/20/2017   Procedure: BIOPSY;  Surgeon: Ronnie Binder, MD;  [facility];  Service: Endoscopy;;  gastric and esophageal   BIOPSY  04/13/2019   Procedure: BIOPSY;  Surgeon: Ronnie Binder, MD;  [facility];  Service: Endoscopy;;  colon   COLONOSCOPY  DEC 2015 Ronnie Patrick   [facility]   COLONOSCOPY WITH PROPOFOL N/A 04/13/2019   Procedure: COLONOSCOPY WITH PROPOFOL;  Surgeon: Ronnie Binder, MD;  [facility];  Service: Endoscopy;  Laterality: N/A;  8:30am   ESOPHAGOGASTRODUODENOSCOPY N/A 02/20/2017   Procedure: ESOPHAGOGASTRODUODENOSCOPY (EGD);  Surgeon: Ronnie Binder, MD;  [facility];  Service: Endoscopy;  Laterality: N/A;  215   EXCISIONAL HEMORRHOIDECTOMY     AGE 70-PAINFUL    FLEXIBLE SIGMOIDOSCOPY N/A 07/15/2015   Procedure: FLEXIBLE SIGMOIDOSCOPY;  Surgeon: Ronnie Binder, MD;  [facility];  Service: Endoscopy;  Laterality: N/A;  100 - moved to 12:00 - office to notify   HEMORRHOID BANDING N/A 07/15/2015   Procedure: HEMORRHOID BANDING;  Surgeon: Ronnie Binder, MD;  [facility];  Service: Endoscopy;  Laterality: N/A;   HERNIA REPAIR Bilateral 10/2020   INGUINAL HERNIA REPAIR Right 04/24/2021   Procedure: HERNIA REPAIR INGUINAL ADULT/ RECURRENT;  Surgeon: Ronnie Cagey, MD;  Location: [facility];  Service: General;  Laterality: Right;   POLYPECTOMY  04/13/2019   Procedure: POLYPECTOMY;  Surgeon: Ronnie Binder, MD;  [facility];  Service: Endoscopy;;  colon   TONSILLECTOMY AND ADENOIDECTOMY     AS A CHILD   UPPER GASTROINTESTINAL ENDOSCOPY  DEC 2015 Ronnie Patrick   GERD, HH    Home Medications:  Allergies as of 10/02/2021       Reactions   Bacitracin Hives   Flagyl [metronidazole] Itching   Neosporin [neomycin-bacitracin Zn-polymyx] Hives   Penicillins Itching   Has patient had a PCN reaction causing immediate rash, facial/tongue/throat swelling, SOB or lightheadedness with hypotension: No Has patient had a PCN reaction causing severe rash involving mucus membranes or skin necrosis: No Has patient had a PCN  reaction that required hospitalization No Has patient had a PCN reaction occurring within the last 10 years: No If all of the above answers are "NO", then may proceed with Cephalosporin use.        Medication List        Accurate as of October 02, 2021  1:16 PM. If you have any questions, ask your nurse or doctor.          acetaminophen 500 MG tablet Commonly known as: TYLENOL Take 500 mg by mouth 2 (two) times daily.   acyclovir 400 MG tablet Commonly known as: ZOVIRAX Take 400 mg by mouth 2 (two) times daily.   atorvastatin 40 MG tablet Commonly known as: LIPITOR Take 40 mg by mouth daily.   calcium  carbonate 750 MG chewable tablet Commonly known as: TUMS EX Chew 1 tablet by mouth 3 (three) times daily as needed for heartburn.   camphor-menthol lotion Commonly known as: SARNA Apply 1 application topically 2 (two) times daily as needed for itching.   fexofenadine 180 MG tablet Commonly known as: ALLEGRA Take 180 mg by mouth at bedtime.   fluticasone 50 MCG/ACT nasal spray Commonly known as: FLONASE Place 1 spray into both nostrils 2 (two) times daily.   lidocaine 5 % Commonly known as: Lidoderm Place 1 patch onto the skin every 12 (twelve) hours.   meloxicam 15 MG tablet Commonly known as: MOBIC Take 15 mg by mouth daily.   metoprolol tartrate 25 MG tablet Commonly known as: LOPRESSOR Take 25 mg by mouth 2 (two) times daily. Hold evening dose if SBP <120   multivitamin with minerals Tabs tablet Take 1 tablet by mouth daily.   naproxen sodium 220 MG tablet Commonly known as: ALEVE Take 220 mg by mouth.   ondansetron 4 MG tablet Commonly known as: Zofran Take 1 tablet (4 mg total) by mouth every 8 (eight) hours as needed.   pantoprazole 40 MG tablet Commonly known as: PROTONIX Take 1 tablet (40 mg total) by mouth daily before breakfast.   polyethylene glycol 17 g packet Commonly known as: MIRALAX / GLYCOLAX Take 17 g by mouth daily.   polyvinyl alcohol 1.4 % ophthalmic solution Commonly known as: LIQUIFILM TEARS Place 1 drop into both eyes every 4 (four) hours as needed for dry eyes.   senna 8.6 MG Tabs tablet Commonly known as: SENOKOT Take 1-4 tablets by mouth at bedtime as needed for mild constipation.   sildenafil 100 MG tablet Commonly known as: VIAGRA Take 100 mg by mouth daily as needed for erectile dysfunction.   sodium chloride 0.65 % Soln nasal spray Commonly known as: OCEAN Place 1 spray into both nostrils as needed for congestion.   Ventolin HFA 108 (90 Base) MCG/ACT inhaler Generic drug: albuterol Inhale 2 puffs into the lungs every 4  (four) hours as needed for wheezing or shortness of breath.        Allergies:  Allergies  Allergen Reactions   Bacitracin Hives   Flagyl [Metronidazole] Itching   Neosporin [Neomycin-Bacitracin Zn-Polymyx] Hives   Penicillins Itching    Has patient had a PCN reaction causing immediate rash, facial/tongue/throat swelling, SOB or lightheadedness with hypotension: No Has patient had a PCN reaction causing severe rash involving mucus membranes or skin necrosis: No Has patient had a PCN reaction that required hospitalization No Has patient had a PCN reaction occurring within the last 10 years: No If all of the above answers are "NO", then may proceed with Cephalosporin use.  Family History: Family History  Problem Relation Age of Onset   Allergic rhinitis Mother    Eczema Mother    Urticaria Mother    Asthma Father    Colon cancer Neg Hx    Colon polyps Neg Hx     Social History:  reports that he quit smoking about 20 years ago. His smoking use included cigarettes. He has a 34.00 pack-year smoking history. He has never used smokeless tobacco. He reports that he does not drink alcohol and does not use drugs.   Physical Exam: BP (!) 153/87   Pulse 71   Temp 99.1 F (37.3 C)   Wt 149 lb 12.8 oz (67.9 kg)   BMI 24.18 kg/m   Constitutional:  Alert and oriented, No acute distress. HEENT: [place] AT, moist mucus membranes.  Trachea midline, no masses. Cardiovascular: No clubbing, cyanosis, or edema. Respiratory: Normal respiratory effort, no increased work of breathing. GI: Abdomen is soft, nontender, nondistended, no abdominal masses GU: No CVA tenderness Lymph: No cervical or inguinal lymphadenopathy. Skin: No rashes, bruises or suspicious lesions. Neurologic: Grossly intact, no focal deficits, moving all 4 extremities. Psychiatric: Normal mood and affect. DRE: Prostate 40 g, smooth without hard area or nodule   Laboratory Data: Lab Results  Component Value Date   WBC  6.1 04/21/2021   HGB 15.5 04/21/2021   HCT 46.1 04/21/2021   MCV 91.3 04/21/2021   PLT 202 04/21/2021    Lab Results  Component Value Date   CREATININE 0.80 07/25/2021    No results found for: PSA  No results found for: TESTOSTERONE  No results found for: HGBA1C  Urinalysis    Component Value Date/Time   APPEARANCEUR Clear 08/02/2021 1415   GLUCOSEU Negative 08/02/2021 1415   BILIRUBINUR Negative 08/02/2021 1415   PROTEINUR Negative 08/02/2021 1415   NITRITE Negative 08/02/2021 1415   LEUKOCYTESUR Negative 08/02/2021 1415    Lab Results  Component Value Date   LABMICR Comment 08/02/2021    Pertinent Imaging:    Assessment & Plan:    1. BPH, Nocturia - we discussed the nature risk benefits and alternatives to surveillance, alpha blockers, 5 alpha reductase inhibitors, daily PDE 5 inhibitor, desmopressin, Botox and PTNS.  All questions answered.  PSA was sent. Exam normal. Start daily tadalafil. See back in 3 mo for PVR. I recommended he speak to his PCP about repeating a sleep study.    No follow-ups on file.  Festus Aloe, MD  [facility]  [address] [place], [place] [address] [phone]

## 2021-10-03 ENCOUNTER — Ambulatory Visit: Payer: Medicare HMO | Admitting: Urology

## 2021-10-03 DIAGNOSIS — R1310 Dysphagia, unspecified: Secondary | ICD-10-CM

## 2021-10-03 LAB — PSA: Prostate Specific Ag, Serum: 1.2 ng/mL (ref 0.0–4.0)

## 2021-10-04 ENCOUNTER — Telehealth: Payer: Self-pay

## 2021-10-04 NOTE — Telephone Encounter (Signed)
Faxed PA form to [facility] for Tadalafil

## 2021-10-16 ENCOUNTER — Telehealth (INDEPENDENT_AMBULATORY_CARE_PROVIDER_SITE_OTHER): Payer: Self-pay | Admitting: *Deleted

## 2021-10-16 ENCOUNTER — Other Ambulatory Visit (INDEPENDENT_AMBULATORY_CARE_PROVIDER_SITE_OTHER): Payer: Self-pay | Admitting: Gastroenterology

## 2021-10-16 DIAGNOSIS — K219 Gastro-esophageal reflux disease without esophagitis: Secondary | ICD-10-CM

## 2021-10-16 MED ORDER — PANTOPRAZOLE SODIUM 40 MG PO TBEC: 40.0000 mg | DELAYED_RELEASE_TABLET | Freq: Every day | ORAL | 0 refills | Status: DC

## 2021-10-16 NOTE — Telephone Encounter (Signed)
Mitzie please call pt and schedule an appt. Pt was notified that 90 day supply sent in and that he needs office visit before further refills.

## 2021-10-16 NOTE — Telephone Encounter (Signed)
Pt requesting refill on pantoprazole to [facility]. He states pharm told him request was denied. Pt last seen over one year ago ( 09/06/20) and has no upcoming appt. I will advise pt he needs visit. Can he have enough refills to get him through til next available appt.   [phone]

## 2021-10-16 NOTE — Telephone Encounter (Signed)
Will refill for 3 months, needs follow up appointment for further refills °

## 2021-10-24 ENCOUNTER — Telehealth: Payer: Self-pay

## 2021-10-26 ENCOUNTER — Ambulatory Visit (INDEPENDENT_AMBULATORY_CARE_PROVIDER_SITE_OTHER): Payer: Medicare HMO | Admitting: Gastroenterology

## 2021-11-01 ENCOUNTER — Telehealth: Payer: Self-pay

## 2021-11-01 NOTE — Telephone Encounter (Signed)
Received call from patient stating that 5 mg Tadalafil for BPH is working great. Patient states that he was told to take an additional quarter of another Tadalafil to equal 6.25mg  when he was ready to have intercourse. It has not been working for him. I informed patient that I do not believe that is the correct dosage and will verify with you. Please advise.

## 2021-11-02 ENCOUNTER — Other Ambulatory Visit: Payer: Self-pay

## 2021-11-02 DIAGNOSIS — N529 Male erectile dysfunction, unspecified: Secondary | ICD-10-CM

## 2021-11-02 MED ORDER — SILDENAFIL CITRATE 20 MG PO TABS: 20.0000 mg | ORAL_TABLET | Freq: Every day | ORAL | 1 refills | Status: DC | PRN

## 2021-11-02 MED ORDER — TADALAFIL 20 MG PO TABS: 20.0000 mg | ORAL_TABLET | Freq: Every day | ORAL | 11 refills | Status: DC | PRN

## 2021-11-02 NOTE — Telephone Encounter (Signed)
Patient had picked up Tadalafil 20 mg. Error in E-scribing medicaton. Pharmacy and patient notified to discontinue Tadalafil 20mg .  Patient had not taken any of the medication and voiced understanding not to do so. Corrected prescription sent in.

## 2021-11-02 NOTE — Telephone Encounter (Addendum)
Per Dr. Junious Silk, ok to send 20mg  Sildenafil  po prn for ED #30 Rx sent. Patient called and made aware.

## 2021-11-20 ENCOUNTER — Ambulatory Visit: Payer: Medicare HMO | Admitting: Urology

## 2021-11-20 ENCOUNTER — Other Ambulatory Visit: Payer: Self-pay

## 2021-11-20 ENCOUNTER — Encounter: Payer: Self-pay | Admitting: Urology

## 2021-11-20 VITALS — BP 149/87 | HR 73 | Wt 149.0 lb

## 2021-11-20 DIAGNOSIS — N138 Other obstructive and reflux uropathy: Secondary | ICD-10-CM

## 2021-11-20 DIAGNOSIS — N401 Enlarged prostate with lower urinary tract symptoms: Secondary | ICD-10-CM

## 2021-11-20 LAB — URINALYSIS, ROUTINE W REFLEX MICROSCOPIC
Bilirubin, UA: NEGATIVE
Glucose, UA: NEGATIVE
Ketones, UA: NEGATIVE
Leukocytes,UA: NEGATIVE
Nitrite, UA: NEGATIVE
Protein,UA: NEGATIVE
RBC, UA: NEGATIVE
Specific Gravity, UA: 1.015 (ref 1.005–1.030)
Urobilinogen, Ur: 0.2 mg/dL (ref 0.2–1.0)
pH, UA: 7 (ref 5.0–7.5)

## 2021-11-20 NOTE — Progress Notes (Signed)
Urological Symptom Review  Patient is experiencing the following symptoms: Frequent urination Get up at night to urinate Erection problems (male only)   Review of Systems  Gastrointestinal (upper)  : Indigestion/heartburn  Gastrointestinal (lower) : Diarrhea Constipation  Constitutional : Negative for symptoms  Skin: Skin rash/lesion Itching  Eyes: Negative for eye symptoms  Ear/Nose/Throat : Negative for Ear/Nose/Throat symptoms  Hematologic/Lymphatic: Negative for Hematologic/Lymphatic symptoms  Cardiovascular : Negative for cardiovascular symptoms  Respiratory : Negative for respiratory symptoms  Endocrine: Negative for endocrine symptoms  Musculoskeletal: Back pain  Neurological: Negative for neurological symptoms  Psychologic: Depression Anxiety

## 2021-11-20 NOTE — Progress Notes (Signed)
11/20/2021 2:05 PM   Ronnie Patrick 12-03-1951 [id number]  Referring provider: Dolores Patty, DO [place]; [place] [address]  No chief complaint on file.   HPI:  F/u -   1) BPH-patient with symptoms of frequency day and night. Drinks 78 oz and no caffeine. Drinks up to bedtime. He does snore, but says he was checked for OSA and doesn't have it. He's woken himself up snoring.  Good stream. Void diary 12-14 x in 24 hrs. Noc x 3-5. Going on for 1-2 years. CT a/p 08/22 with a 40 g prostate and mild BWT. PSA was 1.17 Sep 2021.    No prior GU history or surgery. No NG risk. No gross hematuria. Oct 2022 tried daily tadalafil. Did well. Urinary frequency down 30-50%.    2) ED - takes sildenafil. Oct 2022 daily tadalafil.    Today, seen for the above. UA clear.    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   [place]: Past Medical History:  Diagnosis Date   Basal cell carcinoma (BCC)    skin cancer on scalp-been removed   Chronic pancreatitis ([place])    Exocrine pancreatic insufficiency    Gastritis    GERD (gastroesophageal reflux disease)    Hiatal hernia    HTN (hypertension)    Hyperlipidemia    IBS (irritable bowel syndrome)    Jock itch    LAMISIL/TERBINAFINE REQUIRED   Lyme disease JUN 2016    Surgical History: Past Surgical History:  Procedure Laterality Date   BIOPSY  02/20/2017   Procedure: BIOPSY;  Surgeon: Danie Binder, MD;  [facility];  Service: Endoscopy;;  gastric and esophageal   BIOPSY  04/13/2019   Procedure: BIOPSY;  Surgeon: Danie Binder, MD;  [facility];  Service: Endoscopy;;  colon   COLONOSCOPY  DEC 2015 [facility]   [facility]   COLONOSCOPY WITH PROPOFOL N/A 04/13/2019   Procedure: COLONOSCOPY WITH PROPOFOL;  Surgeon: Danie Binder, MD;  [facility]: [facility];  Service: Endoscopy;  Laterality: N/A;  8:30am   ESOPHAGOGASTRODUODENOSCOPY N/A 02/20/2017   Procedure: ESOPHAGOGASTRODUODENOSCOPY (EGD);  Surgeon: Danie Binder, MD;  [facility]:  [facility];  Service: Endoscopy;  Laterality: N/A;  215   EXCISIONAL HEMORRHOIDECTOMY     AGE 70-PAINFUL   FLEXIBLE SIGMOIDOSCOPY N/A 07/15/2015   Procedure: FLEXIBLE SIGMOIDOSCOPY;  Surgeon: Danie Binder, MD;  [facility]: [facility];  Service: Endoscopy;  Laterality: N/A;  100 - moved to 12:00 - office to notify   HEMORRHOID BANDING N/A 07/15/2015   Procedure: HEMORRHOID BANDING;  Surgeon: Danie Binder, MD;  [facility];  Service: Endoscopy;  Laterality: N/A;   HERNIA REPAIR Bilateral 10/2020   INGUINAL HERNIA REPAIR Right 04/24/2021   Procedure: HERNIA REPAIR INGUINAL ADULT/ RECURRENT;  Surgeon: Virl Cagey, MD;  Location: [facility];  Service: General;  Laterality: Right;   POLYPECTOMY  04/13/2019   Procedure: POLYPECTOMY;  Surgeon: Danie Binder, MD;  [facility];  Service: Endoscopy;;  colon   TONSILLECTOMY AND ADENOIDECTOMY     AS A CHILD   UPPER GASTROINTESTINAL ENDOSCOPY  DEC 2015 [facility]   GERD, HH    Home Medications:  Allergies as of 11/20/2021       Reactions   Bacitracin Hives   Flagyl [metronidazole] Itching   Neosporin Original [bacitracin-neomycin-polymyxin]    Other reaction(s): Unknown   Neosporin [neomycin-bacitracin Zn-polymyx] Hives   Penicillins Itching   Has patient had  a PCN reaction causing immediate rash, facial/tongue/throat swelling, SOB or lightheadedness with hypotension: No Has patient had a PCN reaction causing severe rash involving mucus membranes or skin necrosis: No Has patient had a PCN reaction that required hospitalization No Has patient had a PCN reaction occurring within the last 10 years: No If all of the above answers are "NO", then may proceed with Cephalosporin use.        Medication List        Accurate as of November 20, 2021  2:05 PM. If you have any questions, ask your nurse or doctor.          acetaminophen 500 MG tablet Commonly known as: TYLENOL Take 500 mg by mouth 2 (two) times daily.    acyclovir 400 MG tablet Commonly known as: ZOVIRAX Take 400 mg by mouth 2 (two) times daily.   atorvastatin 40 MG tablet Commonly known as: LIPITOR Take 40 mg by mouth daily.   calcium carbonate 750 MG chewable tablet Commonly known as: TUMS EX Chew 1 tablet by mouth 3 (three) times daily as needed for heartburn.   camphor-menthol lotion Commonly known as: SARNA Apply 1 application topically 2 (two) times daily as needed for itching.   fexofenadine 180 MG tablet Commonly known as: ALLEGRA Take 180 mg by mouth at bedtime.   fluticasone 50 MCG/ACT nasal spray Commonly known as: FLONASE Place 1 spray into both nostrils 2 (two) times daily.   lidocaine 5 % Commonly known as: Lidoderm Place 1 patch onto the skin every 12 (twelve) hours.   meloxicam 15 MG tablet Commonly known as: MOBIC Take 15 mg by mouth daily.   metoprolol tartrate 25 MG tablet Commonly known as: LOPRESSOR Take 25 mg by mouth 2 (two) times daily. Hold evening dose if SBP <120   multivitamin with minerals Tabs tablet Take 1 tablet by mouth daily.   naproxen sodium 220 MG tablet Commonly known as: ALEVE Take 220 mg by mouth.   ondansetron 4 MG tablet Commonly known as: Zofran Take 1 tablet (4 mg total) by mouth every 8 (eight) hours as needed.   pantoprazole 40 MG tablet Commonly known as: PROTONIX Take 1 tablet (40 mg total) by mouth daily before breakfast.   polyethylene glycol 17 g packet Commonly known as: MIRALAX / GLYCOLAX Take 17 g by mouth daily.   polyvinyl alcohol 1.4 % ophthalmic solution Commonly known as: LIQUIFILM TEARS Place 1 drop into both eyes every 4 (four) hours as needed for dry eyes.   senna 8.6 MG Tabs tablet Commonly known as: SENOKOT Take 1-4 tablets by mouth at bedtime as needed for mild constipation.   sildenafil 100 MG tablet Commonly known as: VIAGRA Take 100 mg by mouth daily as needed for erectile dysfunction.   sildenafil 20 MG tablet Commonly known as:  REVATIO Take 1 tablet (20 mg total) by mouth daily as needed.   sodium chloride 0.65 % Soln nasal spray Commonly known as: OCEAN Place 1 spray into both nostrils as needed for congestion.   tadalafil 5 MG tablet Commonly known as: CIALIS Take 1 tablet (5 mg total) by mouth daily.   Ventolin HFA 108 (90 Base) MCG/ACT inhaler Generic drug: albuterol Inhale 2 puffs into the lungs every 4 (four) hours as needed for wheezing or shortness of breath.        Allergies:  Allergies  Allergen Reactions   Bacitracin Hives   Flagyl [Metronidazole] Itching   Neosporin Original [Bacitracin-Neomycin-Polymyxin]     Other  reaction(s): Unknown   Neosporin [Neomycin-Bacitracin Zn-Polymyx] Hives   Penicillins Itching    Has patient had a PCN reaction causing immediate rash, facial/tongue/throat swelling, SOB or lightheadedness with hypotension: No Has patient had a PCN reaction causing severe rash involving mucus membranes or skin necrosis: No Has patient had a PCN reaction that required hospitalization No Has patient had a PCN reaction occurring within the last 10 years: No If all of the above answers are "NO", then may proceed with Cephalosporin use.     Family History: Family History  Problem Relation Age of Onset   Allergic rhinitis Mother    Eczema Mother    Urticaria Mother    Asthma Father    Colon cancer Neg Hx    Colon polyps Neg Hx     Social History:  reports that he quit smoking about 20 years ago. His smoking use included cigarettes. He has a 34.00 pack-year smoking history. He has never used smokeless tobacco. He reports that he does not drink alcohol and does not use drugs.   Physical Exam: BP (!) 149/87   Pulse 73   Wt 149 lb (67.6 kg)   BMI 24.05 kg/m   Constitutional:  Alert and oriented, No acute distress. HEENT: [place] AT, moist mucus membranes.  Trachea midline, no masses. Cardiovascular: No clubbing, cyanosis, or edema. Respiratory: Normal respiratory effort,  no increased work of breathing. GI: Abdomen is soft, nontender, nondistended, no abdominal masses GU: No CVA tenderness Skin: No rashes, bruises or suspicious lesions. Neurologic: Grossly intact, no focal deficits, moving all 4 extremities. Psychiatric: Normal mood and affect.  Laboratory Data: Lab Results  Component Value Date   WBC 6.1 04/21/2021   HGB 15.5 04/21/2021   HCT 46.1 04/21/2021   MCV 91.3 04/21/2021   PLT 202 04/21/2021    Lab Results  Component Value Date   CREATININE 0.80 07/25/2021    No results found for: PSA  No results found for: TESTOSTERONE  No results found for: HGBA1C  Urinalysis    Component Value Date/Time   APPEARANCEUR Clear 10/02/2021 1338   GLUCOSEU Negative 10/02/2021 1338   BILIRUBINUR Negative 10/02/2021 1338   PROTEINUR Negative 10/02/2021 1338   NITRITE Negative 10/02/2021 1338   LEUKOCYTESUR Negative 10/02/2021 1338    Lab Results  Component Value Date   LABMICR Comment 10/02/2021    Pertinent Imaging: N/a     Assessment & Plan:    1. BPH with obstruction/lower urinary tract symptoms He did well on tadalafil 5 mg po daily and I recommend he stay on this dose. He is NOT a good candidate for alfuzosin, silodosin, or tamsulosin as these medicines can cause diziness and retrograde ejaculation. He is NOT a good candidate for finasteride as this medicine is know to cause decrease libido, worsening of erectile dysfunction and possibly depressed mood.   Nurse Louretta Shorten made a copy of his appeal and will send it in or call.   - Urinalysis, Routine w reflex microscopic   No follow-ups on file.  Festus Aloe, MD  [facility]  [address] [place], [place] [address] [phone]

## 2021-11-23 ENCOUNTER — Telehealth: Payer: Self-pay

## 2021-11-23 NOTE — Telephone Encounter (Signed)
Patient would like a call when appeal paperwork is faxed.  Call back: [phone]  Thanks, Helene Kelp

## 2021-11-27 ENCOUNTER — Encounter (INDEPENDENT_AMBULATORY_CARE_PROVIDER_SITE_OTHER): Payer: Self-pay | Admitting: Gastroenterology

## 2021-11-27 ENCOUNTER — Other Ambulatory Visit: Payer: Self-pay

## 2021-11-27 ENCOUNTER — Ambulatory Visit (INDEPENDENT_AMBULATORY_CARE_PROVIDER_SITE_OTHER): Payer: Medicare HMO | Admitting: Gastroenterology

## 2021-11-27 VITALS — BP 150/87 | HR 71 | Temp 98.3°F | Ht 66.0 in | Wt 147.5 lb

## 2021-11-27 DIAGNOSIS — K219 Gastro-esophageal reflux disease without esophagitis: Secondary | ICD-10-CM | POA: Diagnosis not present

## 2021-11-27 DIAGNOSIS — R197 Diarrhea, unspecified: Secondary | ICD-10-CM | POA: Diagnosis not present

## 2021-11-27 MED ORDER — PANTOPRAZOLE SODIUM 40 MG PO TBEC: 40.0000 mg | DELAYED_RELEASE_TABLET | Freq: Every day | ORAL | 3 refills | Status: DC

## 2021-11-27 NOTE — Progress Notes (Signed)
Referring Provider: Dolores Patty, DO Primary Care Physician:  Dolores Patty, DO Primary GI Physician: Jenetta Downer  Chief Complaint  Patient presents with   Gastroesophageal Reflux    Follow up on gerd. Pt states he takes pantoprazole 40mg  daily.  Last 7 - 10 days stools have changed. Having diarrhea. Has stopped stool softner and went back on stool softners yesterday. Having noticeable increase in hiccips and belching. Went to pcp about 7 months ago with sore throat and found out it was from gerd.    HPI:   Ronnie Patrick is a 69 y.o. male with past medical history of lactose intolerance, red scrotum syndrome, HTN, HLD, GERD, hx of Lyme disease.   Patient presenting today for follow up of GERD.   GERD: currently maintained on pantoprazole 40mg  once daily. He is doing well on this medication in regards to reflux symptoms,however, has had increased belching and hiccups. Reports that symptoms sometimes are worse after drinking out of his thermos, but not worse after eating. He questions if he could be taking in too much air when drinking his drinks. Saw PCP about 7 months ago for sore throat which was felt to be related to his GERD, however, he reports he has had no issues with this since then. He was having some occasional reflux symptoms after starting tidalafil for BPH in October as he was taking this medication at night and was noticing heartburn around that time, he switched his PPI to be taken at night which seems to have resolved his symptoms. He will take a tums occasionally if he has breakthrough but has not had any episodes since changing timing of PPI.   Diarrhea:history of the same, with extensive workup in the past, to include celiac serology (negative), colonoscopy in 2020 with , random bx were negative for microscopic colitis. He also had evaluation by a GI provider in [place] in Oct 2020 who performed fecal elastase that was 131, O&P and fecal copper testing that were  unremarkable. CT A/P with contrast in Nov 2020 showed mild to moderate fatty infiltration of pancreas w/o pancreatic ductal dilation and minimal pancreatic head calcifications, subsequent MRI 11/10/19 with right adrenal nodule consistent with adenoma, no pancreatic alterations. Started on creon thereafter, but reported it made diarrhea worse, he was switched to Zenpep without any improvement in symptoms.   CT done 07/25/21 done for concern of recurrent L inguinal hernia and LLQ abdominal pain, showed pancreatic calcifications compatible with chronic pancreatitis but no ductal dilation or strictures. He then underwent repeat fecal fat testing in October 2022 that was negative. 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 He was advised to start IB gard but declined due to concerns that it would worsen his GERD, he was instructed to start probiotic such as Electronics engineer. He requested to have [facility] microbiome testing done, however, Dr. Jenetta Downer had in depth discussion with patient in regards to the lack of evidence behind microbiome testing and advised the patient he was not going to order this test as there was no strong scientific evidence to support preapproval for having the testing done/covered by insurance.   Today, he reports that he Was taking 3 docusate and 3 senna tablets at bedtime r/t his IBS-M, 7-10 days ago he began having diarrhea, prior to episodes of diarrhea, he was having very soft 1-2 BMs per day. Since onset of diarrhea, he has been having 1-3 episodes of loose to watery stools per day.  He had stopped his docusate and Senna up until last night when he was concerned he may be getting constipated and went back to 1 docusate and 2 senna. This morning he had a very small, soft BM and then a second BM that was watery. In October he was started on tidalafil r/t BPH, he questions if his diarrhea could be induced by this medication. He denies  any recent abx, travel or sick contacts. He has not had any abdominal pain, fevers, chills, nausea or vomiting. Has failed miralax, amitiza and linzess for his constipation in the past.  No red flag symptoms. Patient denies melena, hematochezia, nausea, vomiting, constipation, dysphagia, odyonophagia, early satiety or weight loss.   Last Colonoscopy:04/13/19- Two 4 to 5 mm polyps at the hepatic flexure, removed with a cold snare. Resected and retrieved.serrated lesion in hepatic flexure and a tubular adenoma. Random biopsies neg. - External and internal hemorrhoids. - MILDLY Tortuous LEFT colon. - NO OBVIOUS SOURCE FOR DIARRHEA IDENTIFIED  Last Endoscopy:02/20/17 small hiatal hernia, gastritis-negative, benign gastric polyps  Past Medical History:  Diagnosis Date   Basal cell carcinoma (BCC)    skin cancer on scalp-been removed   Chronic pancreatitis (HCC)    Exocrine pancreatic insufficiency    Gastritis    GERD (gastroesophageal reflux disease)    Hiatal hernia    HTN (hypertension)    Hyperlipidemia    IBS (irritable bowel syndrome)    Jock itch    LAMISIL/TERBINAFINE REQUIRED   Lyme disease JUN 2016    Past Surgical History:  Procedure Laterality Date   BIOPSY  02/20/2017   Procedure: BIOPSY;  Surgeon: Danie Binder, MD;  [facility];  Service: Endoscopy;;  gastric and esophageal   BIOPSY  04/13/2019   Procedure: BIOPSY;  Surgeon: Danie Binder, MD;  [facility];  Service: Endoscopy;;  colon   COLONOSCOPY  DEC 2015 [facility]   [facility]   COLONOSCOPY WITH PROPOFOL N/A 04/13/2019   Procedure: COLONOSCOPY WITH PROPOFOL;  Surgeon: Danie Binder, MD;  [facility];  Service: Endoscopy;  Laterality: N/A;  8:30am   ESOPHAGOGASTRODUODENOSCOPY N/A 02/20/2017   Procedure: ESOPHAGOGASTRODUODENOSCOPY (EGD);  Surgeon: Danie Binder, MD;  [facility];  Service: Endoscopy;  Laterality: N/A;  215   EXCISIONAL HEMORRHOIDECTOMY     AGE 75-PAINFUL    FLEXIBLE SIGMOIDOSCOPY N/A 07/15/2015   Procedure: FLEXIBLE SIGMOIDOSCOPY;  Surgeon: Danie Binder, MD;  [facility];  Service: Endoscopy;  Laterality: N/A;  100 - moved to 12:00 - office to notify   HEMORRHOID BANDING N/A 07/15/2015   Procedure: HEMORRHOID BANDING;  Surgeon: Danie Binder, MD;  [facility];  Service: Endoscopy;  Laterality: N/A;   HERNIA REPAIR Bilateral 10/2020   INGUINAL HERNIA REPAIR Right 04/24/2021   Procedure: HERNIA REPAIR INGUINAL ADULT/ RECURRENT;  Surgeon: Virl Cagey, MD;  Location: [facility];  Service: General;  Laterality: Right;   POLYPECTOMY  04/13/2019   Procedure: POLYPECTOMY;  Surgeon: Danie Binder, MD;  [facility];  Service: Endoscopy;;  colon   TONSILLECTOMY AND ADENOIDECTOMY     AS A CHILD   UPPER GASTROINTESTINAL ENDOSCOPY  DEC 2015 [facility]   GERD, HH    Current Outpatient Medications  Medication Sig Dispense Refill   acetaminophen (TYLENOL) 500 MG tablet Take 500 mg by mouth 2 (two) times daily.     acyclovir (ZOVIRAX) 400 MG tablet Take 400 mg by mouth 2 (two) times  daily.     atorvastatin (LIPITOR) 40 MG tablet Take 40 mg by mouth daily.     betamethasone dipropionate 0.05 % cream Apply topically 2 (two) times daily. Up to twice daily as needed     calcium carbonate (TUMS EX) 750 MG chewable tablet Chew 1 tablet by mouth 3 (three) times daily as needed for heartburn.     camphor-menthol (SARNA) lotion Apply 1 application topically 2 (two) times daily as needed for itching.     DOCUSATE SODIUM PO Take by mouth. Up to 3 gel caps at bedtime as needed     fexofenadine (ALLEGRA) 180 MG tablet Take 180 mg by mouth at bedtime.     fluticasone (FLONASE) 50 MCG/ACT nasal spray Place 1 spray into both nostrils 2 (two) times daily.     hydrOXYzine (VISTARIL) 25 MG capsule Take 25 mg by mouth. Takes as needed     meloxicam (MOBIC) 15 MG tablet Take 15 mg by mouth daily.     metoprolol tartrate (LOPRESSOR) 25 MG tablet  Take 25 mg by mouth 2 (two) times daily. Hold evening dose if SBP <120     Multiple Vitamin (MULTIVITAMIN WITH MINERALS) TABS tablet Take 1 tablet by mouth daily.     pantoprazole (PROTONIX) 40 MG tablet Take 1 tablet (40 mg total) by mouth daily before breakfast. 90 tablet 0   polyvinyl alcohol (LIQUIFILM TEARS) 1.4 % ophthalmic solution Place 1 drop into both eyes every 4 (four) hours as needed for dry eyes.     senna (SENOKOT) 8.6 MG TABS tablet Take 1-4 tablets by mouth at bedtime as needed for mild constipation.     sildenafil (REVATIO) 20 MG tablet Take 1 tablet (20 mg total) by mouth daily as needed. 30 tablet 1   sodium chloride (OCEAN) 0.65 % SOLN nasal spray Place 1 spray into both nostrils as needed for congestion.     tadalafil (CIALIS) 5 MG tablet Take 1 tablet (5 mg total) by mouth daily. 30 tablet 11   triamcinolone cream (KENALOG) 0.5 % Apply 1 application topically. Twice daily as needed     VENTOLIN HFA 108 (90 BASE) MCG/ACT inhaler Inhale 2 puffs into the lungs every 4 (four) hours as needed for wheezing or shortness of breath.      No current facility-administered medications for this visit.    Allergies as of 11/27/2021 - Review Complete 11/20/2021  Allergen Reaction Noted   Bacitracin Hives 02/28/2017   Flagyl [metronidazole] Itching 06/29/2015   Neosporin original [bacitracin-neomycin-polymyxin]  11/20/2021   Neosporin [neomycin-bacitracin zn-polymyx] Hives 02/28/2017   Penicillins Itching 06/29/2015    Family History  Problem Relation Age of Onset   Allergic rhinitis Mother    Eczema Mother    Urticaria Mother    Asthma Father    Colon cancer Neg Hx    Colon polyps Neg Hx     Social History   Socioeconomic History   Marital status: Divorced    Spouse name: Not on file   Number of children: Not on file   Years of education: Not on file   Highest education level: Not on file  Occupational History   Not on file  Tobacco Use   Smoking status: Former     Packs/day: 1.00    Years: 34.00    Pack years: 34.00    Types: Cigarettes    Quit date: 07/14/2001    Years since quitting: 20.3   Smokeless tobacco: Never   Tobacco comments:  Quit in 2002  Vaping Use   Vaping Use: Never used  Substance and Sexual Activity   Alcohol use: No    Alcohol/week: 0.0 standard drinks   Drug use: No   Sexual activity: Yes  Other Topics Concern   Not on file  Social History Narrative   [place]. 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      2 SONS:  THEY LIVE IN [place] WITH EX-WIFE.   SEXUAL ORIENTATION: ATTRACTED TO MEN.   RARE ETOH OR SWEETS. HAS A TWIN BROTHER WITH DIABETES.   Social Determinants of Health   Financial Resource Strain: Not on file  Food Insecurity: Not on file  Transportation Needs: Not on file  Physical Activity: Not on file  Stress: Not on file  Social Connections: Not on file   Review of systems General: negative for malaise, night sweats, fever, chills, weight loss Neck: Negative for lumps, goiter, pain and significant neck swelling Resp: Negative for cough, wheezing, dyspnea at rest CV: Negative for chest pain, leg swelling, palpitations, orthopnea GI: denies melena, hematochezia, nausea, vomiting, constipation, dysphagia, odyonophagia, early satiety or unintentional weight loss. +diarrhea MSK: Negative for joint pain or swelling, back pain, and muscle pain. Derm: Negative for itching or rash Psych: Denies depression, anxiety, memory loss, confusion. No homicidal or suicidal ideation.  Heme: Negative for prolonged bleeding, bruising easily, and swollen nodes. Endocrine: Negative for cold or heat intolerance, polyuria, polydipsia and goiter. Neuro: negative for tremor, gait imbalance, syncope and seizures. The remainder of the review of systems is noncontributory.  Physical Exam: BP (!) 150/87 (BP Location: Right Arm,  Patient Position: Sitting, Cuff Size: Large)   Pulse 71   Temp 98.3 F (36.8 C) (Oral)   Ht 5\' 6"  (1.676 m)   Wt 147 lb 8 oz (66.9 kg)   BMI 23.81 kg/m  General:   Alert and oriented. No distress noted. Pleasant and cooperative.  Head:  Normocephalic and atraumatic. Eyes:  Conjuctiva clear without scleral icterus. Mouth:  Oral mucosa pink and moist. Good dentition. No lesions. Heart: Normal rate and rhythm, s1 and s2 heart sounds present.  Lungs: Clear lung sounds in all lobes. Respirations equal and unlabored. Abdomen:  +BS, soft, non-tender and non-distended. No rebound or guarding. No HSM or masses noted. Derm: No palmar erythema or jaundice Msk:  Symmetrical without gross deformities. Normal posture. Extremities:  Without edema. Neurologic:  Alert and  oriented x4 Psych:  Alert and cooperative. Normal mood and affect.  Invalid input(s): 6 MONTHS   ASSESSMENT: Ronnie Patrick is a 70 y.o. male presenting today for yearly follow up of GERD, as well as new onset of diarrhea.  Patient states that he recent started tidalifil for BPH in October, at that time, he noticed worsening of his reflux symptoms since he is taking his tidalifil at night, he then switched pantoprazole 40mg  to take at night time, he has not had any further issues with his reflux since doing this. He should continue with current PPI dosing and timing with good result, he can take OTC famotidine 20mg  as needed, if he has breakthrough reflux symptoms. If symptoms become more prevalent again, he will let me know. He denies dysphagia, weight loss, changes in appetite, early satiety, nausea or vomiting.   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 At baseline he has constipation most of the time, he  takes a regimen of 3 senna and 3 docusate daily with usually 1-2 soft BMs per day. Over the past 7-10 days he began having more watery stools, however, frequency of BMs  did not increase. He stopped taking his constipation regimen but then felt that he may be getting constipated, he started back last night with 2 senna and 1 docusate, this morning he had 1 small, soft BM and 1 watery BM thereafter. He denies any fevers, chills, abdominal pain, nausea, vomiting or blood in stools. He has not had any recent antibiotics, sick contacts or travel. I discussed with him that it would be important to rule out infectious etiology first. If stool studies are negative, it is possible that diarrhea is secondary to tidalifil, however, given the timing of onset in relation to the initiation of this medication, I think this is unlikely a cause but we may consider a drug holiday if infection is ruled out to see if diarrhea is medication induced.  He also could be having overflow diarrhea, secondary to constipation, however he states he was having 1-2 BMs prior to diarrhea starting. He has failed miralax, linzess and amitiza in the past. We discussed trialing motegrity in the future if constipation recurs.  Of note, Upon initiation of visit, patient made me aware that he has not had a good previous experience with our clinic, he does not wish to see Dr. Jenetta Downer again and prefers to see a male provider as Dr. Jenetta Downer did not listen to his concerns. He told me that he did not understand why he had to come in for a visit just to have a refill of his PPI as he lives in [place] and it is a very long drive for him. I apologized for patient's previous bad experience with [place] and I discussed the indications of having atleast 1 yearly visit when we are prescribing a medication, as it is important for [place] to continue ongoing evaluation of any patient that we are providing prescription medication to, whether their symptoms are well maintained or not. I also made patient aware that we are more than happy to see him but if it is too far for him to drive just to have his GERD managed, he can discuss with  his PCP if they would be okay prescribing his PPI for him since GERD symptoms are currently well managed. Pt verbalized understanding.   PLAN:  1.continue pantoprazole 40mg , nightly, refill sent 2. C diff and GI panel to r/o infectious etiology 3. Avoid stool softeners and laxatives unless diarrhea stops 4. Consider motegrity in the future for constipation.  5. Stay well hydrated, eat a diet high in fiber, fruits and veggies 6. Patient to make me aware if he has 4 or more watery BMs per day 7. Practice diaphragmatic breathing to help with hiccups  Follow Up: 1 year  Sahar Ryback L. Alver Sorrow, MSN, APRN, AGNP-C Adult-Gerontology Nurse Practitioner [facility]

## 2021-11-27 NOTE — Patient Instructions (Addendum)
It was very nice to meet you today!  I have sent a refill of pantoprazole 40mg  once daily to your pharmacy, please continue taking this in the evenings as you are doing, with good result.   You can add over the counter famotidine (pepcid) 20mg  as needed, you can take this once a day for breakthrough reflux symptoms.  As for your diarrhea, I would like to check stool studies to ensure there is no infectious process causing your symptoms. Please ensure you are staying well hydrated and eating a diet high in fiber with lots of fruits and veggies, if tolerated. If you begin having more than 4 watery BMs per day, blood in your stools, severe abdominal pain, or fever, please let me know. I will be in touch regarding the results of these studies and further recommendations. If you are continuing to have watery bowel movements, you should hold your senna and docusate as these will worsen your diarrhea.   We will plan to have you follow up in 1 year, unless you need [place] sooner.

## 2021-11-30 NOTE — Telephone Encounter (Signed)
error 

## 2021-12-07 ENCOUNTER — Telehealth: Payer: Self-pay

## 2021-12-07 NOTE — Telephone Encounter (Signed)
Patient was able get approved by insurance for Rx prescribed by Dr. Junious Silk.    He wanted to sincerely thank everyone involved to assist him.  Thanks, Helene Kelp

## 2021-12-12 ENCOUNTER — Telehealth (INDEPENDENT_AMBULATORY_CARE_PROVIDER_SITE_OTHER): Payer: Self-pay | Admitting: Gastroenterology

## 2021-12-12 NOTE — Telephone Encounter (Signed)
I spoke with the patient and made aware that the GI path panel was negative and we would be in contact with him once the C diff test came back. He states he has yet to have this test preformed as the lab gave him the wrong container to collect it in. He states he will probably preform this test on 12/14/2021, but has reservations doing this as he states he does not like this lab as there are nothing but liars there. I apologized for them giving him the wrong container, he says it was not our fault., but will plan on taking the container back to them on 12/14/2021.

## 2021-12-15 ENCOUNTER — Telehealth (INDEPENDENT_AMBULATORY_CARE_PROVIDER_SITE_OTHER): Payer: Self-pay

## 2021-12-15 ENCOUNTER — Other Ambulatory Visit (INDEPENDENT_AMBULATORY_CARE_PROVIDER_SITE_OTHER): Payer: Self-pay

## 2021-12-15 DIAGNOSIS — K219 Gastro-esophageal reflux disease without esophagitis: Secondary | ICD-10-CM

## 2021-12-15 MED ORDER — PANTOPRAZOLE SODIUM 40 MG PO TBEC: 40.0000 mg | DELAYED_RELEASE_TABLET | Freq: Every day | ORAL | 3 refills | Status: DC

## 2021-12-15 NOTE — Telephone Encounter (Signed)
Patient aware of all.

## 2021-12-15 NOTE — Telephone Encounter (Signed)
I called the patient this am to let him know his results of recent C diff testing being negative. He is aware of this, but patient returned a call to the office wanting to know where you thought his increased diarrhea was coming from. He states he realizes he has IBS, but this had gotten worse all of a sudden and this is why he came to see [place]. If all stool testing is negative why did he have increase in diarrhea?  Also patient states while he was here for office visit the provider sent in pantoprazole for him, but this was sent to [facility], when it should have been sent to [facility]. I have sent the prescription to [facility] for the patient today with the same refills he was given the day of his appointment. I made him aware we have submitted to [facility] at his request.

## 2021-12-15 NOTE — Telephone Encounter (Signed)
Patient's lab resulted from [place] with [facility] for C Difficile toxin by pcr 12/15/2021.  Results are negative for C.Diff.   Paper copy placed on Scherrie Gerlach, Np desk here at [facility] for [place] for review.

## 2021-12-21 NOTE — Telephone Encounter (Signed)
Patient states he was fine taking his tadalafil and he had no issues with it causing him to have diarrhea when he started it. He states the diarrhea came a while after starting the medication. Patient states in the last few days he has not had as much diarrhea, and has 2-4 bowel movements daily, but he feels he is not having the quantity of stool in the toilet that he used to so he has started taking 2-3 stool softeners per day, when he was having the diarrhea he was one taking the softener once per day.

## 2021-12-25 NOTE — Telephone Encounter (Signed)
I called and left a message on vm, asked that the patient please return call.

## 2021-12-25 NOTE — Telephone Encounter (Signed)
I spoke with the patient and he does not want to start Motegrity or Trulance as he is now having diarrhea again and he is currently taking 3 stools softeners and four Senna per day. He says he has any where from 1-4 bm's per day.Patient states he does not know what to do.

## 2021-12-25 NOTE — Telephone Encounter (Signed)
Patient states he would consider taking those medications,but says if they make stools looser he doesn't think this is a good idea. Chelsea Please advise, He may need another appointment or a call from a provider. Thanks.

## 2021-12-27 ENCOUNTER — Other Ambulatory Visit (INDEPENDENT_AMBULATORY_CARE_PROVIDER_SITE_OTHER): Payer: Self-pay | Admitting: Gastroenterology

## 2021-12-27 MED ORDER — PRUCALOPRIDE SUCCINATE 1 MG PO TABS: 1.0000 mg | ORAL_TABLET | Freq: Every day | ORAL | 1 refills | Status: DC

## 2021-12-27 NOTE — Progress Notes (Signed)
I connected with patient via telephone, he states that he is now having what he feels to be more constipation, he is taking 4 senna per day. Previously he was having looser stools but was having 2-3 BMs per day, but stools were very small in volume and he felt they were not sufficient enough. He previously took 3 senna and 3 stool softeners for constipation at that time, and then had diarrhea so he had to change his regimen. He states that he is having very soft BMs now. He is having 2-4 soft BMs per day now. Previously he would have 1 BM per day in the morning that was large,  very soft but not liquid. We discussed trying motegrity as he has been on linzess, trulance, amitiza and miralax in the past without results. He feels sure that miralax is what caused his hernia. He is amenable to trying motegrity 1g daily. I discussed potential side effects with him such as GI upset, diarrhea, nausea, and answered all questions. He inquired if motegrity could cause ED, I advised him there is no data showing this and I have not seen it in practice, however, that is not to say any side effect of a medication is not possible to occur in any given person, patient verbalized understanding. He will let me know if he has new or worsening symptoms.  1mg  motegrity daily sent to pharmacy, patient requested 30 day supply only.

## 2021-12-28 ENCOUNTER — Telehealth (INDEPENDENT_AMBULATORY_CARE_PROVIDER_SITE_OTHER): Payer: Self-pay | Admitting: *Deleted

## 2021-12-28 ENCOUNTER — Telehealth (INDEPENDENT_AMBULATORY_CARE_PROVIDER_SITE_OTHER): Payer: Self-pay

## 2021-12-28 NOTE — Telephone Encounter (Signed)
Patient went today to pick up [place]. Co pay is $100.00 dollars. States he can not afford this and is open to other suggestions.

## 2021-12-28 NOTE — Telephone Encounter (Signed)
PA required for motegrity. Submitted through cover my meds today and got approval from 12/17/21 -12/16/22. Patient was notified by crystal today and I faxed approval letter to pt's pharmacy.

## 2021-12-28 NOTE — Telephone Encounter (Signed)
Patient made aware his Motegrity was approved from 12/17/2021 - 12/16/2022.

## 2021-12-28 NOTE — Telephone Encounter (Signed)
Per Vikki Ports she spoke with patient on 12/27/2021, and he was willing to try motegrity. This was submitted to patient pharmacy and we did a Prior Auth on it this am 12/28/2021. Patient called to inquire about [place] doing a PA .I advised we did receive the PA request this am and we are awaiting a determination from the insurance which could take up to three business.

## 2021-12-29 ENCOUNTER — Encounter (INDEPENDENT_AMBULATORY_CARE_PROVIDER_SITE_OTHER): Payer: Self-pay

## 2021-12-29 NOTE — Telephone Encounter (Signed)
Patient aware I have sent the message to [facility] stating he can't afford medication and that she is not working today it will be addressed next week.

## 2022-01-04 NOTE — Telephone Encounter (Signed)
Ronnie Patrick mailed the forms to the patient.

## 2022-02-26 ENCOUNTER — Ambulatory Visit (INDEPENDENT_AMBULATORY_CARE_PROVIDER_SITE_OTHER): Payer: Medicare HMO | Admitting: Gastroenterology

## 2022-02-26 ENCOUNTER — Telehealth (INDEPENDENT_AMBULATORY_CARE_PROVIDER_SITE_OTHER): Payer: Self-pay

## 2022-02-26 NOTE — Telephone Encounter (Signed)
No only took the one time. ?

## 2022-02-26 NOTE — Telephone Encounter (Signed)
Mr. Kromer called stating he took a motegrity 1 mg on last Thursday and Friday he did not have the stool results he thought he should have. So, Friday he started taking the Senna four tablets and three stool softeners again to achieve the stools he thinks are sufficient. He states he has done some investigation and the recommended dose is 2 mg and wants to know why you did not place him on this  dose. I advised that he would probably need to give the medication more than one dose to work,but I would check with the provider. ?

## 2022-02-27 NOTE — Telephone Encounter (Signed)
Patient called back states he had an after thought he would like to know if he can take 2 mg and if this is too much he can cut it back to 1 mg per day. ?

## 2022-02-27 NOTE — Telephone Encounter (Signed)
Patient states there is no way he can wait one to two weeks to see a difference. What is he supposed to do in the mean time to have a sizable stool,before this medication kicks in.  ?

## 2022-03-02 NOTE — Telephone Encounter (Signed)
I spoke with the patient and he states his mother reminded him that not everyone has a bm daily and he started taking the 1 mg tablets and he had three bm's yesterday. Patient states he will stick with the 1 mg for now. ?

## 2022-04-02 ENCOUNTER — Encounter: Payer: Self-pay | Admitting: *Deleted

## 2022-04-02 ENCOUNTER — Ambulatory Visit: Payer: Medicare HMO | Admitting: General Surgery

## 2022-04-02 ENCOUNTER — Encounter: Payer: Self-pay | Admitting: General Surgery

## 2022-04-02 ENCOUNTER — Other Ambulatory Visit: Payer: Self-pay

## 2022-04-02 VITALS — BP 175/91 | HR 67 | Temp 97.9°F | Resp 14 | Ht 66.0 in | Wt 147.0 lb

## 2022-04-02 DIAGNOSIS — R1031 Right lower quadrant pain: Secondary | ICD-10-CM

## 2022-04-02 DIAGNOSIS — R1032 Left lower quadrant pain: Secondary | ICD-10-CM

## 2022-04-02 NOTE — Progress Notes (Signed)
[facility] History and Physical ? ?Reason for Referral: Left groin pain and pain in the RLQ  ?Referring Physician: Self  ? ?Chief Complaint   ?Hernia ?  ? ? ?Ronnie Patrick is a 71 y.o. male.  ?HPI: Ronnie Patrick is a 71 yo known to me after a right sided hernia recurrence after a laparoscopic bilateral hernia repair in [place] in November 2021. I later did a right sided inguinal hernia repair with mesh and he had a hematoma post operatively and this had resolved after some time. He had a CT 07/2021 that looked at the repairs and they were intact with the seroma hematoma noted on that imaging. ? ?He says that he has been doing well and feeling good. He had some sneezing fits and feels more pain now on the left inguinal region and above the hematoma site on the right inguinal region. He is worried he has a recurrence. He says that it is not unbearable pain but that it is worse at times. He has some diarrhea but no obstructive symptoms.  ? ?Past Medical History:  ?Diagnosis Date  ? Basal cell carcinoma (BCC)   ? skin cancer on scalp-been removed  ? Chronic pancreatitis ([place])   ? Exocrine pancreatic insufficiency   ? Gastritis   ? GERD (gastroesophageal reflux disease)   ? Hiatal hernia   ? HTN (hypertension)   ? Hyperlipidemia   ? IBS (irritable bowel syndrome)   ? Jock itch   ? LAMISIL/TERBINAFINE REQUIRED  ? Lyme disease JUN 2016  ? ? ?Past Surgical History:  ?Procedure Laterality Date  ? BIOPSY  02/20/2017  ? Procedure: BIOPSY;  Surgeon: Danie Binder, MD;  [facility];  Service: Endoscopy;;  gastric and esophageal  ? BIOPSY  04/13/2019  ? Procedure: BIOPSY;  Surgeon: Danie Binder, MD;  [facility];  Service: Endoscopy;;  colon  ? COLONOSCOPY  DEC 2015 [facility]  ? IH  ? COLONOSCOPY WITH PROPOFOL N/A 04/13/2019  ? Procedure: COLONOSCOPY WITH PROPOFOL;  Surgeon: Danie Binder, MD;  [facility];  Service: Endoscopy;  Laterality: N/A;  8:30am  ?  ESOPHAGOGASTRODUODENOSCOPY N/A 02/20/2017  ? Procedure: ESOPHAGOGASTRODUODENOSCOPY (EGD);  Surgeon: Danie Binder, MD;  [facility];  Service: Endoscopy;  Laterality: N/A;  215  ? EXCISIONAL HEMORRHOIDECTOMY    ? AGE 8-PAINFUL  ? FLEXIBLE SIGMOIDOSCOPY N/A 07/15/2015  ? Procedure: FLEXIBLE SIGMOIDOSCOPY;  Surgeon: Danie Binder, MD;  [facility];  Service: Endoscopy;  Laterality: N/A;  100 - moved to 12:00 - office to notify  ? HEMORRHOID BANDING N/A 07/15/2015  ? Procedure: HEMORRHOID BANDING;  Surgeon: Danie Binder, MD;  [facility];  Service: Endoscopy;  Laterality: N/A;  ? HERNIA REPAIR Bilateral 10/2020  ? INGUINAL HERNIA REPAIR Right 04/24/2021  ? Procedure: HERNIA REPAIR INGUINAL ADULT/ RECURRENT;  Surgeon: Virl Cagey, MD;  [facility];  Service: General;  Laterality: Right;  ? POLYPECTOMY  04/13/2019  ? Procedure: POLYPECTOMY;  Surgeon: Danie Binder, MD;  [facility];  Service: Endoscopy;;  colon  ? TONSILLECTOMY AND ADENOIDECTOMY    ? AS A CHILD  ? UPPER GASTROINTESTINAL ENDOSCOPY  DEC 2015 [facility]  ? GERD, HH  ? ? ?Family History  ?Problem Relation Age of Onset  ? Allergic rhinitis Mother   ? Eczema Mother   ? Urticaria Mother   ? Asthma Father   ? Colon cancer Neg Hx   ?  Colon polyps Neg Hx   ? ? ?Social History  ? ?Tobacco Use  ? Smoking status: Former  ?  Packs/day: 1.00  ?  Years: 34.00  ?  Pack years: 34.00  ?  Types: Cigarettes  ?  Quit date: 07/14/2001  ?  Years since quitting: 20.7  ? Smokeless tobacco: Never  ? Tobacco comments:  ?  Quit in 2002  ?Vaping Use  ? Vaping Use: Never used  ?Substance Use Topics  ? Alcohol use: No  ?  Alcohol/week: 0.0 standard drinks  ? Drug use: No  ? ? ?Medications: I have reviewed the patient's current medications. ?Allergies as of 04/02/2022   ? ?   Reactions  ? Bacitracin Hives  ? Flagyl [metronidazole] Itching  ? Neosporin Original [bacitracin-neomycin-polymyxin]   ? Other reaction(s): Unknown  ?  Neosporin [neomycin-bacitracin Zn-polymyx] Hives  ? Penicillins Itching  ? Has patient had a PCN reaction causing immediate rash, facial/tongue/throat swelling, SOB or lightheadedness with hypotension: No ?Has patient had a PCN reaction causing severe rash involving mucus membranes or skin necrosis: No ?Has patient had a PCN reaction that required hospitalization No ?Has patient had a PCN reaction occurring within the last 10 years: No ?If all of the above answers are "NO", then may proceed with Cephalosporin use.  ? ?  ? ?  ?Medication List  ?  ? ?  ? Accurate as of April 02, 2022  2:25 PM. If you have any questions, ask your nurse or doctor.  ?  ?  ? ?  ? ?STOP taking these medications   ? ?DOCUSATE SODIUM PO ?Stopped by: Virl Cagey, MD ?  ?hydrOXYzine 25 MG capsule ?Commonly known as: VISTARIL ?Stopped by: Virl Cagey, MD ?  ?senna 8.6 MG Tabs tablet ?Commonly known as: SENOKOT ?Stopped by: Virl Cagey, MD ?  ? ?  ? ?TAKE these medications   ? ?acetaminophen 500 MG tablet ?Commonly known as: TYLENOL ?Take 500 mg by mouth 2 (two) times daily. ?  ?acyclovir 400 MG tablet ?Commonly known as: ZOVIRAX ?Take 400 mg by mouth 2 (two) times daily. ?  ?atorvastatin 40 MG tablet ?Commonly known as: LIPITOR ?Take 40 mg by mouth daily. ?  ?betamethasone dipropionate 0.05 % cream ?Apply topically 2 (two) times daily. Up to twice daily as needed ?  ?calcium carbonate 750 MG chewable tablet ?Commonly known as: TUMS EX ?Chew 1 tablet by mouth 3 (three) times daily as needed for heartburn. ?  ?camphor-menthol lotion ?Commonly known as: SARNA ?Apply 1 application topically 2 (two) times daily as needed for itching. ?  ?fexofenadine 180 MG tablet ?Commonly known as: ALLEGRA ?Take 180 mg by mouth at bedtime. ?  ?fluticasone 50 MCG/ACT nasal spray ?Commonly known as: FLONASE ?Place 1 spray into both nostrils 2 (two) times daily. ?  ?meloxicam 15 MG tablet ?Commonly known as: MOBIC ?Take 15 mg by mouth daily. ?   ?metoprolol tartrate 25 MG tablet ?Commonly known as: LOPRESSOR ?Take 25 mg by mouth 2 (two) times daily. Hold evening dose if SBP <120 ?  ?multivitamin with minerals Tabs tablet ?Take 1 tablet by mouth daily. ?  ?pantoprazole 40 MG tablet ?Commonly known as: PROTONIX ?Take 1 tablet (40 mg total) by mouth daily. ?  ?polyvinyl alcohol 1.4 % ophthalmic solution ?Commonly known as: LIQUIFILM TEARS ?Place 1 drop into both eyes every 4 (four) hours as needed for dry eyes. ?  ?Prucalopride Succinate 1 MG Tabs ?Take 1 mg by mouth daily. ?  ?  sildenafil 20 MG tablet ?Commonly known as: REVATIO ?Take 1 tablet (20 mg total) by mouth daily as needed. ?  ?sodium chloride 0.65 % Soln nasal spray ?Commonly known as: OCEAN ?Place 1 spray into both nostrils as needed for congestion. ?  ?tadalafil 5 MG tablet ?Commonly known as: CIALIS ?Take 1 tablet (5 mg total) by mouth daily. ?  ?triamcinolone cream 0.5 % ?Commonly known as: KENALOG ?Apply 1 application topically. Twice daily as needed ?  ?Ventolin HFA 108 (90 Base) MCG/ACT inhaler ?Generic drug: albuterol ?Inhale 2 puffs into the lungs every 4 (four) hours as needed for wheezing or shortness of breath. ?  ? ?  ? ? ? ?ROS:  ?A comprehensive review of systems was negative except for: Gastrointestinal: positive for abdominal pain and diarrhea ? ?Blood pressure (!) 175/91, pulse 67, temperature 97.9 ?F (36.6 ?C), temperature source Oral, resp. rate 14, height '5\' 6"'$  (1.676 m), weight 147 lb (66.7 kg), SpO2 99 %. ?Physical Exam ?Vitals reviewed.  ?HENT:  ?   Head: Normocephalic.  ?Eyes:  ?   Extraocular Movements: Extraocular movements intact.  ?Cardiovascular:  ?   Rate and Rhythm: Normal rate and regular rhythm.  ?Pulmonary:  ?   Effort: Pulmonary effort is normal.  ?   Breath sounds: Normal breath sounds.  ?Abdominal:  ?   General: There is no distension.  ?   Palpations: Abdomen is soft.  ?   Tenderness: There is abdominal tenderness.  ?   Hernia: No hernia is present.  ?    Comments: Tender over area where hematoma was on there right lower abdomen no obvious hernia, some residual inflammation, no obvious hernia in bilateral inguinal area   ?Musculoskeletal:     ?   General: Normal range of motion.  ?   [place]er

## 2022-04-02 NOTE — Patient Instructions (Signed)
Someone will call once CT is scheduled. If you want it done sooner than the [facility] option, see if [facility] is an option. ? ?Take ibuprofen 800 mg every 6 hours with food for pain. ?Try lidocaine patches. ?Heat to the area.  ?

## 2022-04-05 ENCOUNTER — Ambulatory Visit (HOSPITAL_BASED_OUTPATIENT_CLINIC_OR_DEPARTMENT_OTHER)
Admission: RE | Admit: 2022-04-05 | Discharge: 2022-04-05 | Disposition: A | Discharge status: Home / Self Care | Payer: Medicare HMO | Source: Ambulatory Visit | Attending: General Surgery | Admitting: General Surgery

## 2022-04-05 DIAGNOSIS — R1032 Left lower quadrant pain: Secondary | ICD-10-CM | POA: Insufficient documentation

## 2022-04-05 DIAGNOSIS — R1031 Right lower quadrant pain: Secondary | ICD-10-CM | POA: Diagnosis present

## 2022-04-05 LAB — POCT I-STAT CREATININE: Creatinine, Ser: 0.8 mg/dL (ref 0.61–1.24)

## 2022-04-05 MED ORDER — IOHEXOL 300 MG/ML  SOLN
100.0000 mL | Freq: Once | INTRAMUSCULAR | Status: AC | PRN
  Administered 2022-04-05: 80 mL via INTRAVENOUS

## 2022-04-09 NOTE — Progress Notes (Signed)
Updated patient that no hernia on the left or the right in the inguinal region and the right hematoma. He has been known about the adrenal gland nodule and that it is stabled. His primary care is aware.  He says he has had an adrenal protocol CT in the past. He is going to make sure his primary care is updated.  He feels like the pain in the right side was from sneezing and may be from a groin strain from sneezing. He has been exercising with cycling and he is not doing that right now due to the pain. Told him to do heat and rest for a while and try to just walk. He likes to use naproxen for pain. He is going to see his PCP.

## 2022-04-24 ENCOUNTER — Telehealth: Payer: Self-pay

## 2022-04-24 NOTE — Telephone Encounter (Unsigned)
Patient left a voice message:  04-23-2022 ? ?Needing to discuss medication. ?No sure if he will need an appt. ? ?Call back:  [phone] ? ?Thanks, ?Helene Kelp ?

## 2022-04-25 ENCOUNTER — Telehealth: Payer: Self-pay

## 2022-04-25 NOTE — Telephone Encounter (Signed)
Patient is asking if there is a patient assistance for the Cialis he is currently taking.  Informed him we only know of [place] but I will check to see if the Reps know of any financial assistance for patients.  He also wanted to schedule a 6 month f/u with Dr. Junious Silk, scheduled for 6/19.  ?

## 2022-06-04 ENCOUNTER — Ambulatory Visit: Payer: Medicare HMO | Admitting: Urology

## 2022-06-04 ENCOUNTER — Encounter: Payer: Self-pay | Admitting: Urology

## 2022-06-04 ENCOUNTER — Telehealth: Payer: Self-pay | Admitting: *Deleted

## 2022-06-04 VITALS — BP 155/86 | HR 70 | Ht 66.0 in | Wt 147.0 lb

## 2022-06-04 DIAGNOSIS — N401 Enlarged prostate with lower urinary tract symptoms: Secondary | ICD-10-CM

## 2022-06-04 DIAGNOSIS — N138 Other obstructive and reflux uropathy: Secondary | ICD-10-CM | POA: Diagnosis not present

## 2022-06-04 LAB — URINALYSIS, ROUTINE W REFLEX MICROSCOPIC
Bilirubin, UA: NEGATIVE
Glucose, UA: NEGATIVE
Ketones, UA: NEGATIVE
Leukocytes,UA: NEGATIVE
Nitrite, UA: NEGATIVE
Protein,UA: NEGATIVE
RBC, UA: NEGATIVE
Specific Gravity, UA: 1.01 (ref 1.005–1.030)
Urobilinogen, Ur: 0.2 mg/dL (ref 0.2–1.0)
pH, UA: 7.5 (ref 5.0–7.5)

## 2022-06-04 MED ORDER — TADALAFIL 5 MG PO TABS: 5.0000 mg | ORAL_TABLET | Freq: Every day | ORAL | 3 refills | Status: DC

## 2022-06-04 NOTE — Telephone Encounter (Signed)
Patient has called in and stated he was given an appt. With Dr. Jenetta Downer and really only wants to see Chelsea--he feels he is have an allergic reaction of some sort on his medication--he would like Chelsea to call him back he has an appt on 06/14/2022 and if needed to be virtual sooner can but he would prefer in person--he is having symptoms he believes after reading insert of stomah pain etc from the medication --I am sorry it sounded like Lotegrity but I am not sure.  His # is [phone]

## 2022-06-04 NOTE — Telephone Encounter (Signed)
Patient states within a few weeks to a month of starting motegrity, he started having upper and lower abdominal pain, tenderness and discomfort, espigastric pain, burping and hiccups. He states he thought his hernia came back and had ct scan done that cost a hefty co pay and spend $320 on physical therapy. Went back to pcp last week and while there he googled side effects of motegrity and states he read abdominal pain and frequent urination which he states he also had been having since starting the medication. He is still taking the med. He did not want to stop med without talking to [place] first.   [phone]. States he has appt with urologist this afternoon and if he does not answer can leave a detailed voicemail.

## 2022-06-04 NOTE — Telephone Encounter (Signed)
Discussed with patient and he states he is having 1 - 4 stools per day

## 2022-06-04 NOTE — Progress Notes (Signed)
06/04/2022 2:01 PM   Ronnie Patrick [birth year] [id number]  Referring provider: Dolores Patty, DO [place]; [place] [address]  No chief complaint on file.   HPI:  F/u -    1) BPH-patient with symptoms of frequency day and night. Drinks 78 oz and no caffeine. Drinks up to bedtime. He does snore, but says he was checked for OSA and doesn't have it. He's woken himself up snoring.  Good stream. Void diary 12-14 x in 24 hrs. Noc x 3-5. Going on for 1-2 years. CT a/p 08/22 with a 40 g prostate and mild BWT. PSA was 1.17 Sep 2021. Repeat CT Apr 2023 with no BWT and small prostate.    No prior GU history or surgery. No NG risk. No gross hematuria. Oct 2022 tried daily tadalafil. Did well. Urinary frequency down 30-50%.   2) right adrenal nodule, left renal cyst -a 2.1 cm right adrenal nodule has been noted on CT scan 21 September 2020 and a 2.5 cm left renal cyst.  It is homogenous with attenuation of approximately 60 Hounsfield units.  It was stable on follow-up CT in 2022 and on CT in April 2023.    3) ED - takes sildenafil. Oct 2022 daily tadalafil.    Today, seen for the above. UA clear. Nocturia improved.    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    [place]: Past Medical History:  Diagnosis Date   Basal cell carcinoma (BCC)    skin cancer on scalp-been removed   Chronic pancreatitis ([place])    Exocrine pancreatic insufficiency    Gastritis    GERD (gastroesophageal reflux disease)    Hiatal hernia    HTN (hypertension)    Hyperlipidemia    IBS (irritable bowel syndrome)    Jock itch    LAMISIL/TERBINAFINE REQUIRED   Lyme disease JUN 2016    Surgical History: Past Surgical History:  Procedure Laterality Date   BIOPSY  02/20/2017   Procedure: BIOPSY;  Surgeon: Danie Binder, MD;  [facility];  Service: Endoscopy;;  gastric and esophageal   BIOPSY  04/13/2019   Procedure: BIOPSY;  Surgeon: Danie Binder, MD;  [facility];  Service: Endoscopy;;  colon    COLONOSCOPY  DEC 2015 [facility]   IH   COLONOSCOPY WITH PROPOFOL N/A 04/13/2019   Procedure: COLONOSCOPY WITH PROPOFOL;  Surgeon: Danie Binder, MD;  [facility];  Service: Endoscopy;  Laterality: N/A;  8:30am   ESOPHAGOGASTRODUODENOSCOPY N/A 02/20/2017   Procedure: ESOPHAGOGASTRODUODENOSCOPY (EGD);  Surgeon: Danie Binder, MD;  [facility];  Service: Endoscopy;  Laterality: N/A;  215   EXCISIONAL HEMORRHOIDECTOMY     AGE 52-PAINFUL   FLEXIBLE SIGMOIDOSCOPY N/A 07/15/2015   Procedure: FLEXIBLE SIGMOIDOSCOPY;  Surgeon: Danie Binder, MD;  [facility];  Service: Endoscopy;  Laterality: N/A;  100 - moved to 12:00 - office to notify   HEMORRHOID BANDING N/A 07/15/2015   Procedure: HEMORRHOID BANDING;  Surgeon: Danie Binder, MD;  [facility];  Service: Endoscopy;  Laterality: N/A;   HERNIA REPAIR Bilateral 10/2020   INGUINAL HERNIA REPAIR Right 04/24/2021   Procedure: HERNIA REPAIR INGUINAL ADULT/ RECURRENT;  Surgeon: Virl Cagey, MD;  [facility];  Service: General;  Laterality: Right;   POLYPECTOMY  04/13/2019   Procedure: POLYPECTOMY;  Surgeon: Danie Binder, MD;  [facility];  Service: Endoscopy;;  colon   TONSILLECTOMY AND ADENOIDECTOMY  AS A CHILD   UPPER GASTROINTESTINAL ENDOSCOPY  DEC 2015 [facility]   GERD, HH    Home Medications:  Allergies as of 06/04/2022       Reactions   Bacitracin Hives   Flagyl [metronidazole] Itching   Neosporin Original [bacitracin-neomycin-polymyxin]    Other reaction(s): Unknown   Neosporin [neomycin-bacitracin Zn-polymyx] Hives   Penicillins Itching   Has patient had a PCN reaction causing immediate rash, facial/tongue/throat swelling, SOB or lightheadedness with hypotension: No Has patient had a PCN reaction causing severe rash involving mucus membranes or skin necrosis: No Has patient had a PCN reaction that required hospitalization No Has patient had a PCN reaction occurring  within the last 10 years: No If all of the above answers are "NO", then may proceed with Cephalosporin use.        Medication List        Accurate as of June 04, 2022  2:01 PM. If you have any questions, ask your nurse or doctor.          acetaminophen 500 MG tablet Commonly known as: TYLENOL Take 500 mg by mouth 2 (two) times daily.   acyclovir 400 MG tablet Commonly known as: ZOVIRAX Take 400 mg by mouth 2 (two) times daily.   atorvastatin 40 MG tablet Commonly known as: LIPITOR Take 40 mg by mouth daily.   betamethasone dipropionate 0.05 % cream Apply topically 2 (two) times daily. Up to twice daily as needed   calcium carbonate 750 MG chewable tablet Commonly known as: TUMS EX Chew 1 tablet by mouth 3 (three) times daily as needed for heartburn.   camphor-menthol lotion Commonly known as: SARNA Apply 1 application topically 2 (two) times daily as needed for itching.   fexofenadine 180 MG tablet Commonly known as: ALLEGRA Take 180 mg by mouth at bedtime.   fluticasone 50 MCG/ACT nasal spray Commonly known as: FLONASE Place 1 spray into both nostrils 2 (two) times daily.   meloxicam 15 MG tablet Commonly known as: MOBIC Take 15 mg by mouth daily.   metoprolol tartrate 25 MG tablet Commonly known as: LOPRESSOR Take 25 mg by mouth 2 (two) times daily. Hold evening dose if SBP <120   multivitamin with minerals Tabs tablet Take 1 tablet by mouth daily.   pantoprazole 40 MG tablet Commonly known as: PROTONIX Take 1 tablet (40 mg total) by mouth daily.   polyvinyl alcohol 1.4 % ophthalmic solution Commonly known as: LIQUIFILM TEARS Place 1 drop into both eyes every 4 (four) hours as needed for dry eyes.   Prucalopride Succinate 1 MG Tabs Take 1 mg by mouth daily.   sildenafil 20 MG tablet Commonly known as: REVATIO Take 1 tablet (20 mg total) by mouth daily as needed.   sodium chloride 0.65 % Soln nasal spray Commonly known as: OCEAN Place 1  spray into both nostrils as needed for congestion.   tadalafil 5 MG tablet Commonly known as: CIALIS Take 1 tablet (5 mg total) by mouth daily.   triamcinolone cream 0.5 % Commonly known as: KENALOG Apply 1 application topically. Twice daily as needed   Ventolin HFA 108 (90 Base) MCG/ACT inhaler Generic drug: albuterol Inhale 2 puffs into the lungs every 4 (four) hours as needed for wheezing or shortness of breath.        Allergies:  Allergies  Allergen Reactions   Bacitracin Hives   Flagyl [Metronidazole] Itching   Neosporin Original [Bacitracin-Neomycin-Polymyxin]     Other reaction(s): Unknown   Neosporin [Neomycin-Bacitracin  Zn-Polymyx] Hives   Penicillins Itching    Has patient had a PCN reaction causing immediate rash, facial/tongue/throat swelling, SOB or lightheadedness with hypotension: No Has patient had a PCN reaction causing severe rash involving mucus membranes or skin necrosis: No Has patient had a PCN reaction that required hospitalization No Has patient had a PCN reaction occurring within the last 10 years: No If all of the above answers are "NO", then may proceed with Cephalosporin use.     Family History: Family History  Problem Relation Age of Onset   Allergic rhinitis Mother    Eczema Mother    Urticaria Mother    Asthma Father    Colon cancer Neg Hx    Colon polyps Neg Hx     Social History:  reports that he quit smoking about 20 years ago. His smoking use included cigarettes. He has a 34.00 pack-year smoking history. He has never used smokeless tobacco. He reports that he does not drink alcohol and does not use drugs.   Physical Exam: BP (!) 155/86   Pulse 70   Ht '5\' 6"'$  (1.676 m)   Wt 147 lb (66.7 kg)   BMI 23.73 kg/m   Constitutional:  Alert and oriented, No acute distress. HEENT: [place] AT, moist mucus membranes.  Trachea midline, no masses. Cardiovascular: No clubbing, cyanosis, or edema. Respiratory: Normal respiratory effort, no  increased work of breathing. GI: Abdomen is soft, nontender, nondistended, no abdominal masses GU: No CVA tenderness Skin: No rashes, bruises or suspicious lesions. Neurologic: Grossly intact, no focal deficits, moving all 4 extremities. Psychiatric: Normal mood and affect.  Laboratory Data: Lab Results  Component Value Date   WBC 6.1 04/21/2021   HGB 15.5 04/21/2021   HCT 46.1 04/21/2021   MCV 91.3 04/21/2021   PLT 202 04/21/2021    Lab Results  Component Value Date   CREATININE 0.80 04/05/2022    No results found for: "PSA"  No results found for: "TESTOSTERONE"  No results found for: "HGBA1C"  Urinalysis    Component Value Date/Time   APPEARANCEUR Clear 11/20/2021 1510   GLUCOSEU Negative 11/20/2021 1510   BILIRUBINUR Negative 11/20/2021 1510   PROTEINUR Negative 11/20/2021 1510   NITRITE Negative 11/20/2021 1510   LEUKOCYTESUR Negative 11/20/2021 1510    Lab Results  Component Value Date   LABMICR Comment 11/20/2021    Pertinent Imaging: N/a   Assessment & Plan:    1. BPH with obstruction/lower urinary tract symptoms Tadalafil refilled. See in 6 mo with PSA prior  - Urinalysis, Routine w reflex microscopic  No follow-ups on file.  Festus Aloe, MD  [facility]  [address] [place], [place] [address] [phone]

## 2022-06-05 NOTE — Telephone Encounter (Signed)
Discussed with patient per [facility] - would like to keep him on the motegrity for now, we can discuss further at his upcoming visit.   Patient verbalized understanding and has appt on 6/29

## 2022-06-11 ENCOUNTER — Telehealth: Payer: Self-pay | Admitting: *Deleted

## 2022-06-12 NOTE — Telephone Encounter (Signed)
Call placed to patient and patient made aware.  

## 2022-06-14 ENCOUNTER — Ambulatory Visit (INDEPENDENT_AMBULATORY_CARE_PROVIDER_SITE_OTHER): Payer: Medicare HMO | Admitting: Gastroenterology

## 2022-06-14 ENCOUNTER — Encounter (INDEPENDENT_AMBULATORY_CARE_PROVIDER_SITE_OTHER): Payer: Self-pay | Admitting: Gastroenterology

## 2022-06-14 VITALS — BP 158/87 | HR 65 | Temp 98.6°F | Wt 148.3 lb

## 2022-06-14 DIAGNOSIS — R1030 Lower abdominal pain, unspecified: Secondary | ICD-10-CM

## 2022-06-14 DIAGNOSIS — K582 Mixed irritable bowel syndrome: Secondary | ICD-10-CM | POA: Diagnosis not present

## 2022-06-14 MED ORDER — PRUCALOPRIDE SUCCINATE 2 MG PO TABS: 2.0000 mg | ORAL_TABLET | Freq: Every day | ORAL | 3 refills | Status: DC

## 2022-06-14 MED ORDER — PEG 3350-KCL-NA BICARB-NACL 420 G PO SOLR: 4000.0000 mL | Freq: Once | ORAL | 0 refills | Status: AC

## 2022-06-14 NOTE — Progress Notes (Signed)
Referring Provider: Dolores Patty, DO Primary Care Physician:  Dolores Patty, DO Primary GI Physician: Jenetta Downer  Chief Complaint  Patient presents with   Abdominal Pain    Abdominal pain for past 3 months. He sneezed and had horrible pain and thought his hernia was back. Saw Dr. Constance Haw and had scan and it showed a stool ball. Takes naproxen and tylenol for pain. Alternates every 6 hours. Looked up motegrity side effects and it showed abdominal pain.    HPI:   Ronnie Patrick is a 71 y.o. male with past medical history of  lactose intolerance, red scrotum syndrome, HTN, HLD, GERD, hx of Lyme disease.  Patient presenting today for abdominal pain.  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 Patient has failed miralza, linzess and amitiza in the past. Previously on 3 senna and 3 dulcolax per day. Patient last seen December 2022, at that time he was c/o diarrhea, stool studies and GI path panel checked-negative, he was later started on motegrity '1mg'$  daily in January.  Recent CT A/P in April with contrast ordered by Dr Constance Haw after he saw her for concerns of right inguinal hernia with presence of 7cm stool ball.   Today, he states that about 3 months ago, he sneezed one day and had terrible Right sided abdominal pain, he thought his inguinal hernia was back. Saw Dr. Constance Haw who did a CT scan, as above, which did not show evidence of recurrent hernia. His PCP sent him to physical therapy which did not help. He is taking naproxen '500mg'$  BID, he is taking '500mg'$  tylenol BID in between. He reports that he is unable to exercise and do his normal activity as he was previously riding an exercise bike but this causes him too much pain now. He is having 1-4 BMs per day most days. Stools are not very hard and tend to get looser as the day goes on. He states that he has not been happy on the motegrity since he started it on  March 6th, though he tells me that when he first got off of docusate and senna, he was somewhat constipated and then felt that bowels were actually moving pretty well after that. He is concerned that his abdominal pain may be related to his bowels. He questions why he was started on '1mg'$  of motegrity instead of 2 and wonders if the medication may be causing his pain as he read this was a side effect. He denies rectal bleeding or melena, no changes in appetite, weight loss or other GI symptoms.   Last Colonoscopy:04/13/19- Two 4 to 5 mm polyps at the hepatic flexure, removed with a cold snare. Resected and retrieved.serrated lesion in hepatic flexure and a tubular adenoma. Random biopsies neg. - External and internal hemorrhoids. - MILDLY Tortuous LEFT colon. - NO OBVIOUS SOURCE FOR DIARRHEA IDENTIFIED   Last Endoscopy:02/20/17 small hiatal hernia, gastritis-negative, benign gastric polyps    Past Medical History:  Diagnosis Date   Basal cell carcinoma (BCC)    skin cancer on scalp-been removed   Chronic pancreatitis ([place])    Exocrine pancreatic insufficiency    Gastritis    GERD (gastroesophageal reflux disease)    Hiatal hernia    HTN (hypertension)    Hyperlipidemia    IBS (irritable bowel syndrome)    Jock itch    LAMISIL/TERBINAFINE REQUIRED   Lyme disease JUN 2016    Past Surgical History:  Procedure Laterality Date   BIOPSY  02/20/2017   Procedure: BIOPSY;  Surgeon: Danie Binder, MD;  [facility];  Service: Endoscopy;;  gastric and esophageal   BIOPSY  04/13/2019   Procedure: BIOPSY;  Surgeon: Danie Binder, MD;  [facility];  Service: Endoscopy;;  colon   COLONOSCOPY  DEC 2015 [facility]   [facility]   COLONOSCOPY WITH PROPOFOL N/A 04/13/2019   Procedure: COLONOSCOPY WITH PROPOFOL;  Surgeon: Danie Binder, MD;  [facility];  Service: Endoscopy;  Laterality: N/A;  8:30am   ESOPHAGOGASTRODUODENOSCOPY N/A 02/20/2017   Procedure:  ESOPHAGOGASTRODUODENOSCOPY (EGD);  Surgeon: Danie Binder, MD;  [facility];  Service: Endoscopy;  Laterality: N/A;  215   EXCISIONAL HEMORRHOIDECTOMY     AGE 71-PAINFUL   FLEXIBLE SIGMOIDOSCOPY N/A 07/15/2015   Procedure: FLEXIBLE SIGMOIDOSCOPY;  Surgeon: Danie Binder, MD;  [facility];  Service: Endoscopy;  Laterality: N/A;  100 - moved to 12:00 - office to notify   HEMORRHOID BANDING N/A 07/15/2015   Procedure: HEMORRHOID BANDING;  Surgeon: Danie Binder, MD;  [facility];  Service: Endoscopy;  Laterality: N/A;   HERNIA REPAIR Bilateral 10/2020   INGUINAL HERNIA REPAIR Right 04/24/2021   Procedure: HERNIA REPAIR INGUINAL ADULT/ RECURRENT;  Surgeon: Virl Cagey, MD;  Location: [facility];  Service: General;  Laterality: Right;   POLYPECTOMY  04/13/2019   Procedure: POLYPECTOMY;  Surgeon: Danie Binder, MD;  [facility];  Service: Endoscopy;;  colon   TONSILLECTOMY AND ADENOIDECTOMY     AS A CHILD   UPPER GASTROINTESTINAL ENDOSCOPY  DEC 2015 [facility]   GERD, HH    Current Outpatient Medications  Medication Sig Dispense Refill   acetaminophen (TYLENOL) 500 MG tablet Take 500 mg by mouth 2 (two) times daily.     acyclovir (ZOVIRAX) 400 MG tablet Take 400 mg by mouth 2 (two) times daily.     atorvastatin (LIPITOR) 40 MG tablet Take 40 mg by mouth daily.     calcium carbonate (TUMS EX) 750 MG chewable tablet Chew 1 tablet by mouth 3 (three) times daily as needed for heartburn.     camphor-menthol (SARNA) lotion Apply 1 application topically 2 (two) times daily as needed for itching.     fexofenadine (ALLEGRA) 180 MG tablet Take 180 mg by mouth at bedtime.     fluticasone (FLONASE) 50 MCG/ACT nasal spray Place 1 spray into both nostrils 2 (two) times daily.     meloxicam (MOBIC) 15 MG tablet Take 15 mg by mouth daily.     metoprolol tartrate (LOPRESSOR) 25 MG tablet Take 25 mg by mouth 2 (two) times daily. Hold evening dose if SBP <120      Multiple Vitamin (MULTIVITAMIN WITH MINERALS) TABS tablet Take 1 tablet by mouth daily.     pantoprazole (PROTONIX) 40 MG tablet Take 1 tablet (40 mg total) by mouth daily. 90 tablet 3   polyvinyl alcohol (LIQUIFILM TEARS) 1.4 % ophthalmic solution Place 1 drop into both eyes every 4 (four) hours as needed for dry eyes.     Prucalopride Succinate 1 MG TABS Take 1 mg by mouth daily. 30 tablet 1   sodium chloride (OCEAN) 0.65 % SOLN nasal spray Place 1 spray into both nostrils as needed for congestion.     tadalafil (CIALIS) 5 MG tablet Take 1 tablet (5 mg total) by mouth daily. 90 tablet 3   triamcinolone cream (KENALOG) 0.5 %  Apply 1 application topically. Twice daily as needed     VENTOLIN HFA 108 (90 BASE) MCG/ACT inhaler Inhale 2 puffs into the lungs every 4 (four) hours as needed for wheezing or shortness of breath.      betamethasone dipropionate 0.05 % cream Apply topically 2 (two) times daily. Up to twice daily as needed     sildenafil (REVATIO) 20 MG tablet Take 1 tablet (20 mg total) by mouth daily as needed. 30 tablet 1   No current facility-administered medications for this visit.    Allergies as of 06/14/2022 - Review Complete 06/14/2022  Allergen Reaction Noted   Bacitracin Hives 02/28/2017   Flagyl [metronidazole] Itching 06/29/2015   Neosporin original [bacitracin-neomycin-polymyxin]  11/20/2021   Neosporin [neomycin-bacitracin zn-polymyx] Hives 02/28/2017   Penicillins Itching 06/29/2015    Family History  Problem Relation Age of Onset   Allergic rhinitis Mother    Eczema Mother    Urticaria Mother    Asthma Father    Colon cancer Neg Hx    Colon polyps Neg Hx     Social History   Socioeconomic History   Marital status: Divorced    Spouse name: Not on file   Number of children: Not on file   Years of education: Not on file   Highest education level: Not on file  Occupational History   Not on file  Tobacco Use   Smoking status: Former    Packs/day: 1.00     Years: 34.00    Total pack years: 34.00    Types: Cigarettes    Quit date: 07/14/2001    Years since quitting: 20.9    Passive exposure: Past   Smokeless tobacco: Never   Tobacco comments:    Quit in 2002  Vaping Use   Vaping Use: Never used  Substance and Sexual Activity   Alcohol use: No    Alcohol/week: 0.0 standard drinks of alcohol   Drug use: No   Sexual activity: Yes  Other Topics Concern   Not on file  Social History Narrative   [place]. 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      2 SONS:  THEY LIVE IN [place] WITH EX-WIFE.   SEXUAL ORIENTATION: ATTRACTED TO MEN.   RARE ETOH OR SWEETS. HAS A TWIN BROTHER WITH DIABETES.   Social Determinants of Health   Financial Resource Strain: Not on file  Food Insecurity: Not on file  Transportation Needs: Not on file  Physical Activity: Not on file  Stress: Not on file  Social Connections: Not on file   Review of systems General: negative for malaise, night sweats, fever, chills, weight loss Neck: Negative for lumps, goiter, pain and significant neck swelling Resp: Negative for cough, wheezing, dyspnea at rest CV: Negative for chest pain, leg swelling, palpitations, orthopnea GI: denies melena, hematochezia, nausea, vomiting, diarrhea, constipation, dysphagia, odyonophagia, early satiety or unintentional weight loss. +abdominal pain MSK: Negative for joint pain or swelling, back pain, and muscle pain. Derm: Negative for itching or rash Psych: Denies depression, anxiety, memory loss, confusion. No homicidal or suicidal ideation.  Heme: Negative for prolonged bleeding, bruising easily, and swollen nodes. Endocrine: Negative for cold or heat intolerance, polyuria, polydipsia and goiter. Neuro: negative for tremor, gait imbalance, syncope and seizures. The remainder of the review of systems is noncontributory.  Physical Exam: BP  (!) 158/87 (BP Location: Left Arm, Patient Position: Sitting, Cuff Size:  Normal)   Pulse 65   Temp 98.6 F (37 C) (Oral)   Wt 148 lb 4.8 oz (67.3 kg)   BMI 23.94 kg/m  General:   Alert and oriented. No distress noted. Pleasant and cooperative.  Head:  Normocephalic and atraumatic. Eyes:  Conjuctiva clear without scleral icterus. Mouth:  Oral mucosa pink and moist. Good dentition. No lesions. Heart: Normal rate and rhythm, s1 and s2 heart sounds present.  Lungs: Clear lung sounds in all lobes. Respirations equal and unlabored. Abdomen:  +BS, soft, and non-distended. Mildly TTP RLQ and LLQ. No rebound or guarding. No HSM or masses noted. Derm: No palmar erythema or jaundice Msk:  Symmetrical without gross deformities. Normal posture. Extremities:  Without edema. Neurologic:  Alert and  oriented x4 Psych:  Alert and cooperative. Normal mood and affect.  Invalid input(s): "6 MONTHS"   ASSESSMENT: Thoams Siefert is a 71 y.o. male presenting today for R sided abdominal pain.  Significant history of diarrhea in the past with extensive work up, eventually given diagnosis of IBS-M. He has failed Mirlax, linzess and amitiza. Started on motegrity '1mg'$  in March 2023 and sounds to have been doing well on this until late April when he sneezed and had sudden onset of RLQ pain. Saw generally surgeon who rule out inguinal hernia as cause, via CT A/P, which then showed large 7cm stool ball. He is taking naproxen '500mg'$  BID for his abdominal pain along with Tylenol '500mg'$  BID.   Abdominal pain is likely related to a strained muscle from sneezing but could certainly be influenced by his ongoing constipation/presence of stool ball.Patient inquired why he was started on '1mg'$  of Motegrity vs '2mg'$ , I discussed with him that I prefer to start on lowest dose possible to avoid any potential side effects, however, at this time, given presence of stool ball on recent CT imaging, I am recommending we do bowel prep to reset  his bowels and then start on '2mg'$  Motegrity daily after that. He will hold motegrity during time he is completing bowel prep. He has plenty of '1mg'$  tablets so he will double these until new '2mg'$  prescription is approved and sent to his pharmacy.   I also encouraged him against ongoing naproxen use as he is currently taking '1000mg'$  of this daily and has been for the past 2-3 months. He was counseled on potential side effects to include Gi bleeding, ulcers and overall damage to the GI tract. Encouraged to utilize tylenol for pain and do not exceed 4g/24 hours.   No red flag symptoms. Patient denies melena, hematochezia, nausea, vomiting, dysphagia, odyonophagia, early satiety or weight loss.    PLAN:  Rx Bowel prep 2. Motegrity '2mg'$  daily after bowel prep 3. Avoid NSAIDs 4. Can continue tylenol PRN for pain 5. Pt to call me with an update if symptoms to not improve  All questions were answered, patient verbalized understanding and is in agreement with plan as outlined above.   Follow Up: 6 months  Keyari Kleeman L. Alver Sorrow, MSN, APRN, AGNP-C Adult-Gerontology Nurse Practitioner [facility]

## 2022-06-14 NOTE — Patient Instructions (Signed)
It was nice to see you, I am sorry that you are not feeling well. As we discussed, the naproxen (an NSAID medication) can be very hard on your GI tract, causing bleeding, ulcers and other damage to the lining, I would recommend you holding off on taking this for now, you can continue tylenol as long as you are not exceeding '4000mg'$  in a 24 hour period. I am sending a bowel prep to help get your bowels reset and cleaned out, please hold the motegrity during this time After the bowel prep is completed and you are cleaned out, you can restart your motegrity, please take 1 pills to make '2mg'$ , we will work on getting prescription for '2mg'$  approved. Please let me know how you are feeling after the bowel prep  Follow up 6 months

## 2022-06-20 ENCOUNTER — Telehealth (INDEPENDENT_AMBULATORY_CARE_PROVIDER_SITE_OTHER): Payer: Self-pay | Admitting: *Deleted

## 2022-06-20 NOTE — Telephone Encounter (Signed)
Ronnie Patrick asked him to call after he took bowel prep to see if pain is better, pain not completely gone but better   Please call [phone]

## 2022-06-21 NOTE — Telephone Encounter (Signed)
He started motegrity '2mg'$  Monday this week, skipped Tuesday which was the day he did the bowel prep and has been taking daily since then

## 2022-06-22 NOTE — Telephone Encounter (Signed)
Pain got better for a few days but now pain is back to where it was when he saw you in office. Taking motegrity '2mg'$  daily.

## 2022-06-25 NOTE — Telephone Encounter (Signed)
Patient states BM's improved for a few days but not pain and BM's are back to where he was at the office visit.

## 2022-06-27 NOTE — Telephone Encounter (Signed)
Called and discussed with patient. Patient is requesting a call from [place]. States he has been in pain for bout a week and calls are just going back and forth. He states he thought another CT scan was going to be done if he was still having pain to see if he still had a stool ball.

## 2022-07-06 DIAGNOSIS — A6000 Herpesviral infection of urogenital system, unspecified: Secondary | ICD-10-CM | POA: Insufficient documentation

## 2022-07-07 IMAGING — CT CT ABD-PELV W/ CM
2 of 5 series · 15 of 46 positions shown, 17 images · IV contrast (APPLIED)
Comparison: July 25, 2021

CLINICAL DATA: Right lower abdominal pain above right inguinal
repair at site of old hematoma. Left inguinal pain.

EXAM:
CT ABDOMEN AND PELVIS WITH CONTRAST
TECHNIQUE: Multidetector CT imaging of the abdomen and pelvis was performed
using the standard protocol following bolus administration of
intravenous contrast.

[Series 2: abd pel w · axial · 0.69mm/px · z∈[+870,+1270]mm · 12 of 92 slices shown, 14 images]
[im 6/92  soft-tissue]
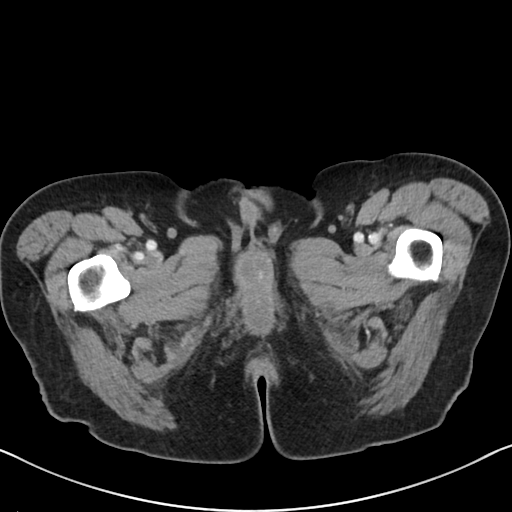
[im 6/92  bone]
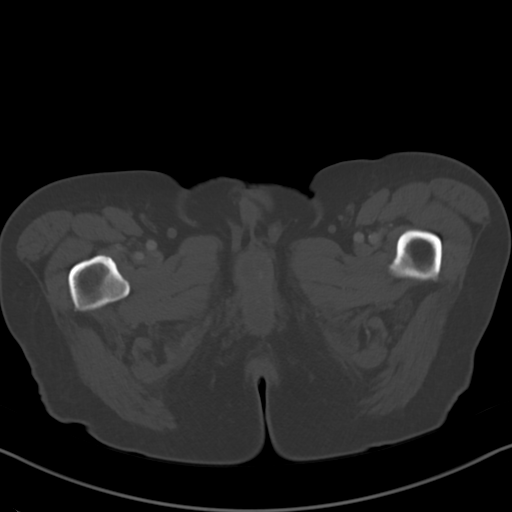
[im 16/92  soft-tissue]
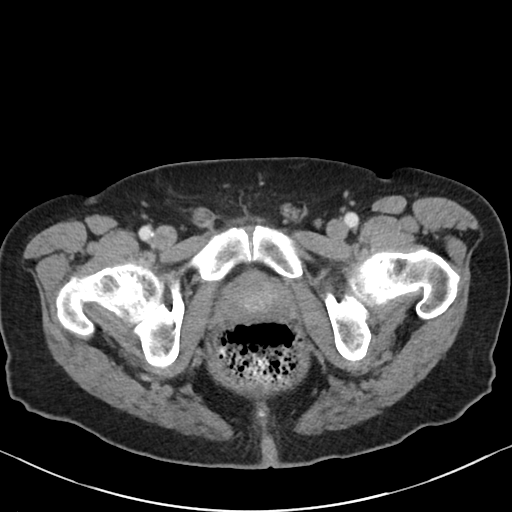
[im 21/92  soft-tissue]
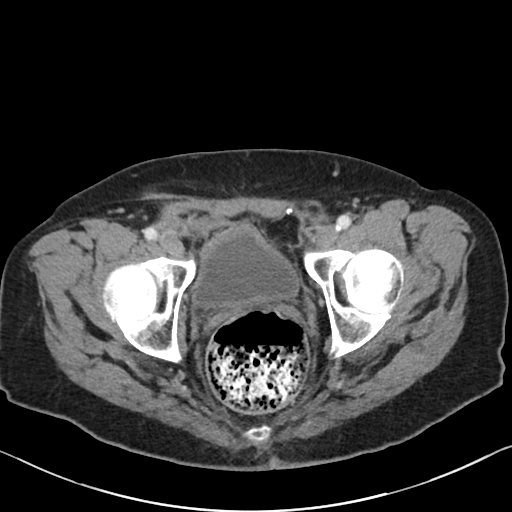
[im 26/92  soft-tissue]
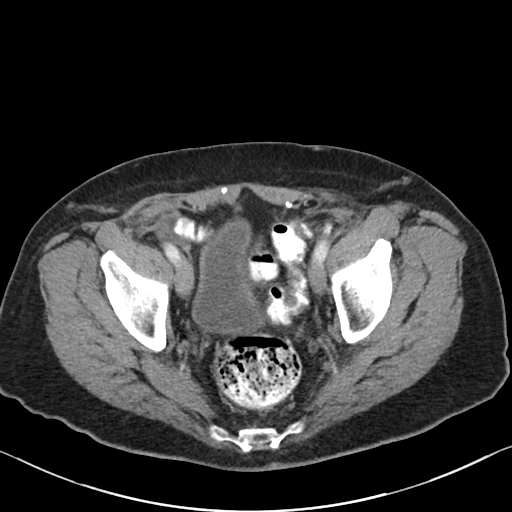
[im 36/92  soft-tissue]
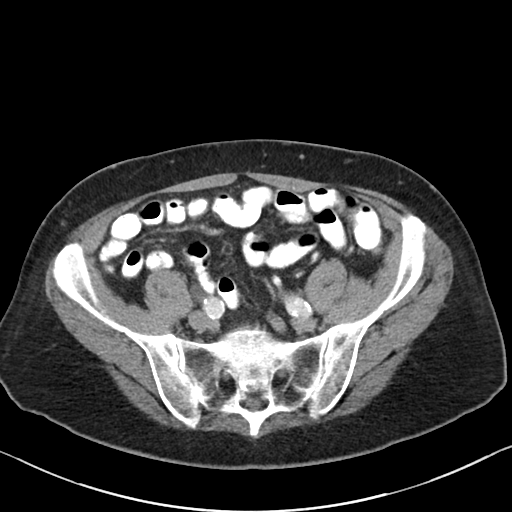
[im 41/92  soft-tissue]
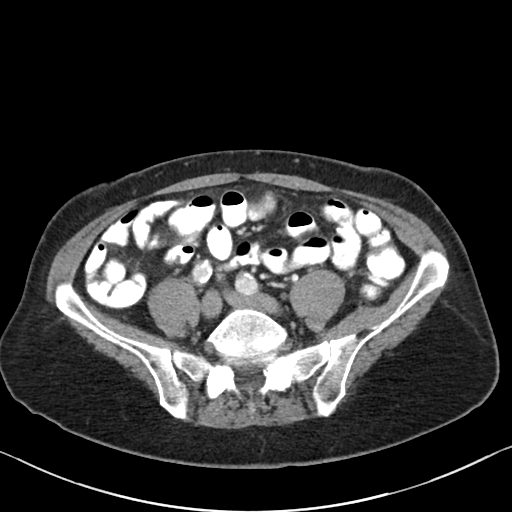
[im 51/92  soft-tissue]
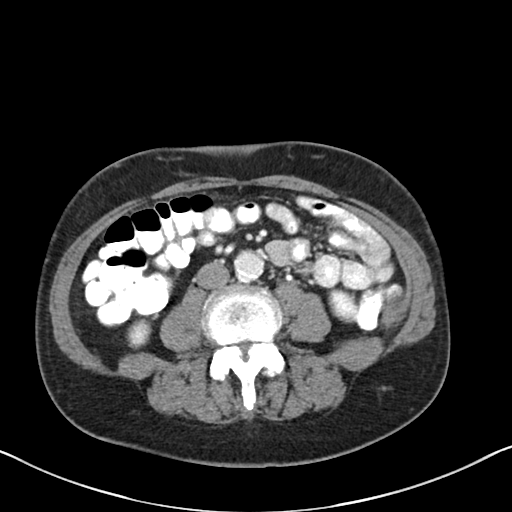
[im 56/92  soft-tissue]
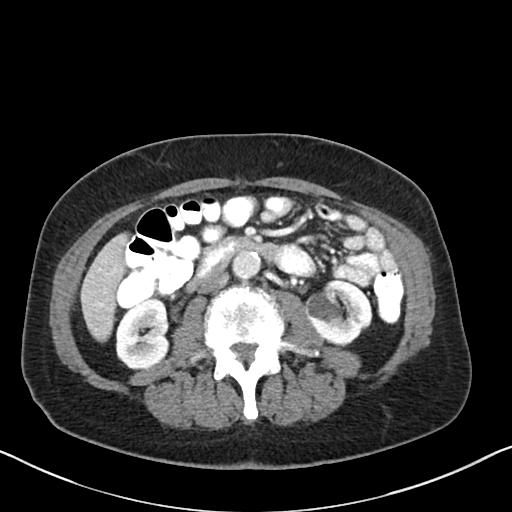
[im 66/92  soft-tissue]
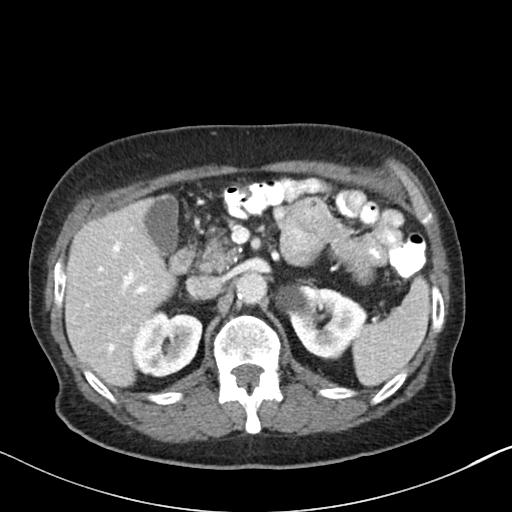
[im 66/92  bone]
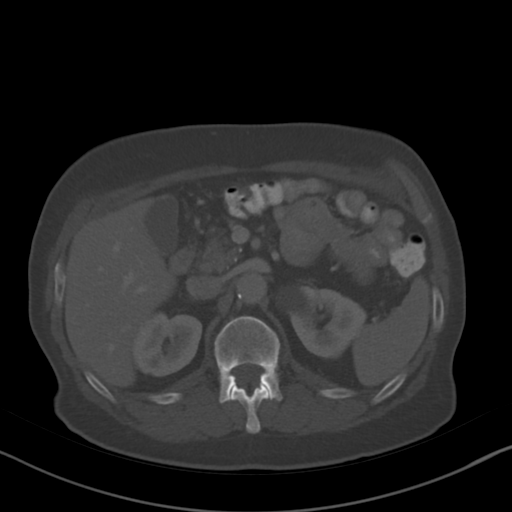
[im 71/92  soft-tissue]
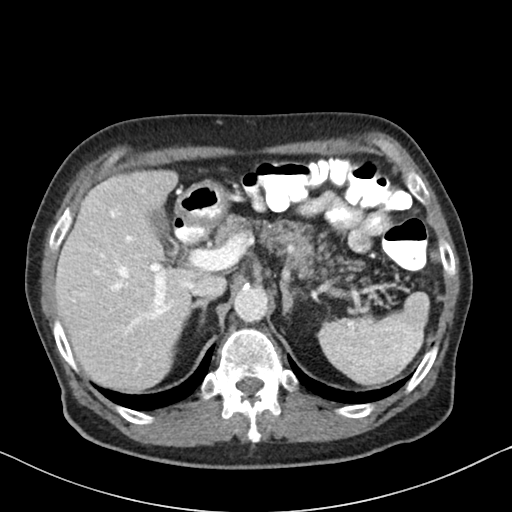
[im 76/92  soft-tissue]
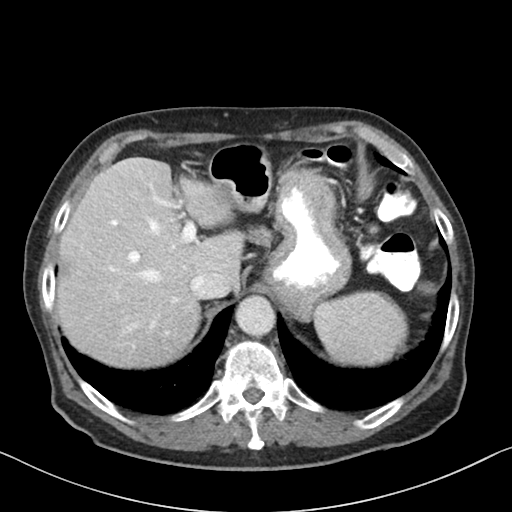
[im 86/92  soft-tissue]
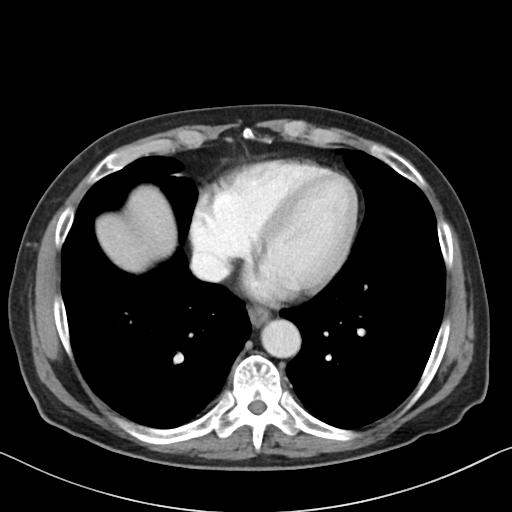

[Series 5: coronal · coronal · 0.71mm/px · 3 of 90 slices shown]
[im 30/90  soft-tissue]
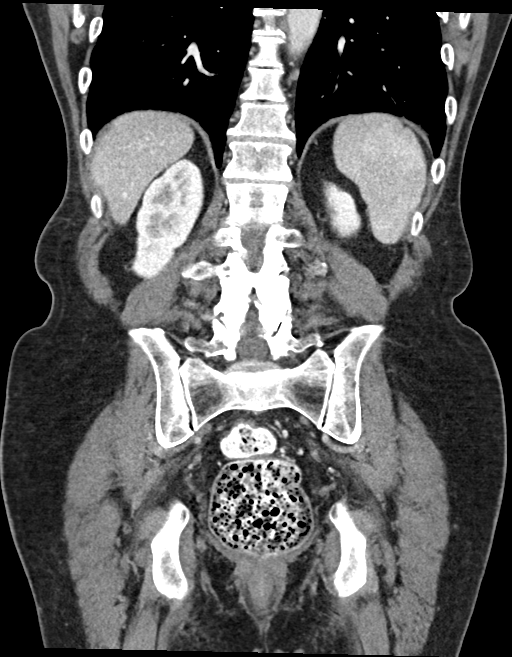
[im 40/90  soft-tissue]
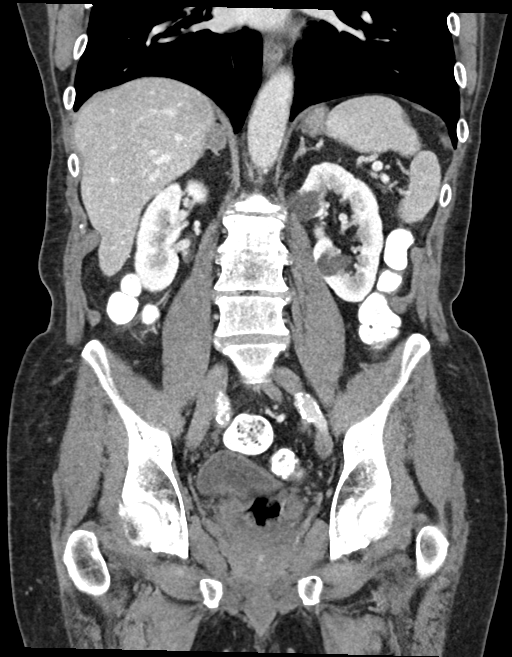
[im 50/90  soft-tissue]
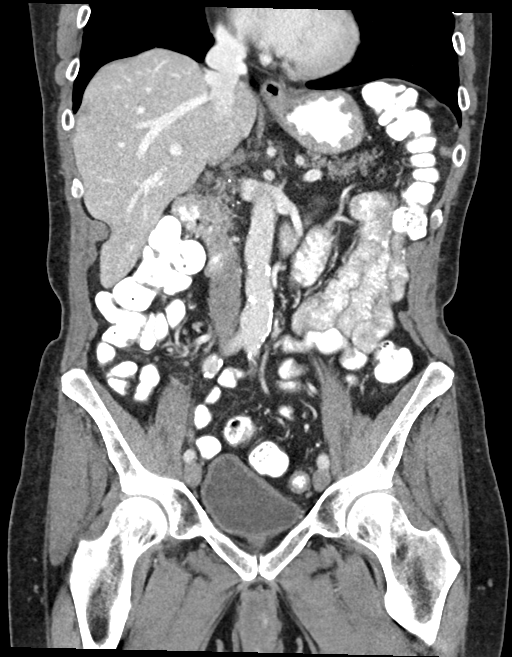

[15 of 46 positions shown; findings below may reference images not displayed]

RADIATION DOSE REDUCTION: This exam was performed according to the
departmental dose-optimization program which includes automated
exposure control, adjustment of the mA and/or kV according to
patient size and/or use of iterative reconstruction technique.

CONTRAST:  80mL OMNIPAQUE IOHEXOL 300 MG/ML  SOLN
FINDINGS: Lower chest: A 4 mm nodule in the lingula is identified on series 4,
image 4 and coronal image 64, not significantly changed since
September 20, 2020. No follow-up of this nodule is necessary. The lower
chest and lung bases are otherwise normal.

Hepatobiliary: No focal liver abnormality is seen. No gallstones,
gallbladder wall thickening, or biliary dilatation.

Pancreas: Unremarkable. No pancreatic ductal dilatation or
surrounding inflammatory changes.

Spleen: Normal in size without focal abnormality.

Adrenals/Urinary Tract: A left adrenal nodule measuring 2.1 cm by my
measurement today is not significantly changed since September 20, 2020. This nodule demonstrates an attenuation of 64 Hounsfield
units. The left adrenal gland is normal. Two simple cysts are
identified in the left kidney, not significantly changed. These
simple cysts are of no clinical significance and require no imaging
follow-up. The kidneys are otherwise normal in appearance. The
ureters and bladder are normal.

Stomach/Bowel: The stomach and small bowel are normal. There is a
prominent stool ball in the rectum measuring nearly 7 cm in all
dimensions. The remainder of the colon is normal. The appendix is
best visualized on coronal imaging and normal in appearance with no
evidence of appendicitis.

Vascular/Lymphatic: The abdominal aorta is normal in caliber with no
dissection. Calcified atherosclerotic changes are identified in the
abdominal aorta, iliac vessels, and proximal femoral vessels. No
adenopathy is identified.

Reproductive: Prostate is unremarkable.

Other: The hematoma at the site of previous right inguinal hernia
repair has resolved. The patient is status post bilateral inguinal
hernia repair with no evidence recurrence. No cause for pain
identified.

Musculoskeletal: Continued grade 1 anterolisthesis of L3 versus L4.
Fusion of L2-3. Schmorl's nodes identified along the superior
endplate of L2. Facet degenerative changes. Multilevel degenerative
disc disease.
IMPRESSION: 1. No cause for the patient's pain identified.
2. The 2.1 cm nodule in the right adrenal gland with homogeneous
attenuation of approximately 60 Hounsfield units is not
significantly changed since September 2020. The stability since
September 2020 is reassuring and this nodule is almost certainly and
adenoma. A CT scan of the abdomen utilizing an adrenal protocol
could confirm.
3. Prominent stool ball in the rectum measuring up to 7 cm in all
dimensions.
4. Calcified atherosclerotic changes identified in the nonaneurysmal
aorta.
5. The previous hematoma over the right inguinal repair site has
resolved.
6. Grade 1 anterolisthesis of L3 versus L4. Fusion of L2-3.
Degenerative changes in the lumbar spine.

## 2022-07-17 ENCOUNTER — Telehealth (INDEPENDENT_AMBULATORY_CARE_PROVIDER_SITE_OTHER): Payer: Self-pay

## 2022-07-17 NOTE — Telephone Encounter (Signed)
Patient called today to give a progress report to [facility] as she requested. Patient report to be feeling "1,000%" better since stopping the Motegrity and resumed senna and docusate. He says the pain is not completely gone,but he feels so much better that he has even started back exercising. Patient states if you have any questions to call him at [phone]. Thanks.

## 2022-07-19 ENCOUNTER — Ambulatory Visit (INDEPENDENT_AMBULATORY_CARE_PROVIDER_SITE_OTHER): Payer: Medicare HMO | Admitting: Gastroenterology

## 2022-08-01 ENCOUNTER — Telehealth: Payer: Self-pay

## 2022-08-01 NOTE — Telephone Encounter (Signed)
Patient applied for assistance to cover his cialis rx.  He was notified that the MD portion of the form had not been filled out yet.  He is asking for an update if you have one.

## 2022-08-02 NOTE — Telephone Encounter (Signed)
Called patient back, he states he gave them Lisa's e-mail since the form required an e-mail in order to submit.  I have reached out to lisa by e-mail to see if she ever received the form.  Waiting for response.

## 2022-08-09 NOTE — Telephone Encounter (Signed)
Gave patient my email for him to send those forms for Dr. Junious Silk to complete.

## 2022-09-03 NOTE — Telephone Encounter (Signed)
Dr. Junious Silk filled out MD portion of patient assistance form.  Form was then mailed to patient for him to complete patient portion of the form, patient was notified and will reach out with any other needs.

## 2022-11-15 ENCOUNTER — Encounter (INDEPENDENT_AMBULATORY_CARE_PROVIDER_SITE_OTHER): Payer: Self-pay | Admitting: Gastroenterology

## 2022-12-03 ENCOUNTER — Encounter: Payer: Self-pay | Admitting: Urology

## 2022-12-03 ENCOUNTER — Ambulatory Visit: Payer: Medicare HMO | Admitting: Urology

## 2022-12-03 VITALS — BP 164/83 | HR 70 | Ht 66.0 in | Wt 148.3 lb

## 2022-12-03 DIAGNOSIS — N401 Enlarged prostate with lower urinary tract symptoms: Secondary | ICD-10-CM

## 2022-12-03 DIAGNOSIS — N138 Other obstructive and reflux uropathy: Secondary | ICD-10-CM | POA: Diagnosis not present

## 2022-12-03 LAB — URINALYSIS, ROUTINE W REFLEX MICROSCOPIC
Bilirubin, UA: NEGATIVE
Glucose, UA: NEGATIVE
Ketones, UA: NEGATIVE
Leukocytes,UA: NEGATIVE
Nitrite, UA: NEGATIVE
Protein,UA: NEGATIVE
RBC, UA: NEGATIVE
Specific Gravity, UA: 1.015 (ref 1.005–1.030)
Urobilinogen, Ur: 0.2 mg/dL (ref 0.2–1.0)
pH, UA: 7 (ref 5.0–7.5)

## 2022-12-03 MED ORDER — SILDENAFIL CITRATE 50 MG PO TABS: 50.0000 mg | ORAL_TABLET | Freq: Every day | ORAL | 5 refills | Status: AC | PRN

## 2022-12-03 NOTE — Progress Notes (Unsigned)
12/03/2022 2:07 PM   Ronnie Patrick 01/22/1951 [id number]  Referring provider: Dolores Patty, DO [place]; [place] [address]  No chief complaint on file.   HPI:  F/u -    1) BPH-patient with symptoms of frequency day and night. Drinks 78 oz and no caffeine. Drinks up to bedtime. He does snore, but says he was checked for OSA and doesn't have it. He's woken himself up snoring.  Good stream. Void diary 12-14 x in 24 hrs. Noc x 3-5. Going on for 1-2 years. CT a/p 08/22 with a 40 g prostate and mild BWT. PSA was 1.2 in Oct 2022. Repeat CT Apr 2023 with no BWT and small prostate.    No prior GU history or surgery. No NG risk. No gross hematuria. Oct 2022 tried daily tadalafil. Did well. Urinary frequency down 30-50%.    2) right adrenal nodule, left renal cyst -a 2.1 cm right adrenal nodule has been noted on CT scan 21 September 2020 and a 2.5 cm left renal cyst.  It is homogenous with attenuation of approximately 60 Hounsfield units.  It was stable on follow-up CT in 2022 and CT in April 2023.     3) ED - takes sildenafil. Oct 2022 daily tadalafil.    Today, seen for the above. He is on brand name Cialis 5 mg daily. Voiding well. "Keeps a journal". Noc x 2 which is improved. He also takes sildenafil 50-60 mg as needed before. No dizziness or HA.    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    [place]: Past Medical History:  Diagnosis Date   Basal cell carcinoma (BCC)    skin cancer on scalp-been removed   Chronic pancreatitis ([place])    Exocrine pancreatic insufficiency    Gastritis    GERD (gastroesophageal reflux disease)    Hiatal hernia    HTN (hypertension)    Hyperlipidemia    IBS (irritable bowel syndrome)    Jock itch    LAMISIL/TERBINAFINE REQUIRED   Lyme disease JUN 2016    Surgical History: Past Surgical History:  Procedure Laterality Date   BIOPSY  02/20/2017   Procedure: BIOPSY;  Surgeon: Danie Binder, MD;  [facility];  Service: Endoscopy;;  gastric and  esophageal   BIOPSY  04/13/2019   Procedure: BIOPSY;  Surgeon: Danie Binder, MD;  [facility];  Service: Endoscopy;;  colon   COLONOSCOPY  DEC 2015 [facility]   COLONOSCOPY WITH PROPOFOL N/A 04/13/2019   Procedure: COLONOSCOPY WITH PROPOFOL;  Surgeon: Danie Binder, MD;  [facility];  Service: Endoscopy;  Laterality: N/A;  8:30am   ESOPHAGOGASTRODUODENOSCOPY N/A 02/20/2017   Procedure: ESOPHAGOGASTRODUODENOSCOPY (EGD);  Surgeon: Danie Binder, MD;  [facility];  Service: Endoscopy;  Laterality: N/A;  215   EXCISIONAL HEMORRHOIDECTOMY     AGE 89-PAINFUL   FLEXIBLE SIGMOIDOSCOPY N/A 07/15/2015   Procedure: FLEXIBLE SIGMOIDOSCOPY;  Surgeon: Danie Binder, MD;  [facility];  Service: Endoscopy;  Laterality: N/A;  100 - moved to 12:00 - office to notify   HEMORRHOID BANDING N/A 07/15/2015   Procedure: HEMORRHOID BANDING;  Surgeon: Danie Binder, MD;  [facility];  Service: Endoscopy;  Laterality: N/A;   HERNIA REPAIR Bilateral 10/2020   INGUINAL HERNIA REPAIR Right 04/24/2021   Procedure: HERNIA REPAIR INGUINAL ADULT/ RECURRENT;  Surgeon: Virl Cagey, MD;  [facility];  Service: General;  Laterality: Right;  POLYPECTOMY  04/13/2019   Procedure: POLYPECTOMY;  Surgeon: Danie Binder, MD;  [facility];  Service: Endoscopy;;  colon   TONSILLECTOMY AND ADENOIDECTOMY     AS A CHILD   UPPER GASTROINTESTINAL ENDOSCOPY  DEC 2015 [facility]   GERD, HH    Home Medications:  Allergies as of 12/03/2022       Reactions   Bacitracin Hives   Flagyl [metronidazole] Itching   Neosporin Original [bacitracin-neomycin-polymyxin]    Other reaction(s): Unknown   Neosporin [neomycin-bacitracin Zn-polymyx] Hives   Penicillins Itching   Has patient had a PCN reaction causing immediate rash, facial/tongue/throat swelling, SOB or lightheadedness with hypotension: No Has patient had a PCN reaction causing severe rash involving  mucus membranes or skin necrosis: No Has patient had a PCN reaction that required hospitalization No Has patient had a PCN reaction occurring within the last 10 years: No If all of the above answers are "NO", then may proceed with Cephalosporin use.        Medication List        Accurate as of December 03, 2022  2:07 PM. If you have any questions, ask your nurse or doctor.          acetaminophen 500 MG tablet Commonly known as: TYLENOL Take 500 mg by mouth 2 (two) times daily.   acyclovir 400 MG tablet Commonly known as: ZOVIRAX Take 400 mg by mouth 2 (two) times daily.   atorvastatin 40 MG tablet Commonly known as: LIPITOR Take 40 mg by mouth daily.   calcium carbonate 750 MG chewable tablet Commonly known as: TUMS EX Chew 1 tablet by mouth 3 (three) times daily as needed for heartburn.   camphor-menthol lotion Commonly known as: SARNA Apply 1 application topically 2 (two) times daily as needed for itching.   fexofenadine 180 MG tablet Commonly known as: ALLEGRA Take 180 mg by mouth at bedtime.   fluticasone 50 MCG/ACT nasal spray Commonly known as: FLONASE Place 1 spray into both nostrils 2 (two) times daily.   meloxicam 15 MG tablet Commonly known as: MOBIC Take 15 mg by mouth daily.   metoprolol tartrate 25 MG tablet Commonly known as: LOPRESSOR Take 25 mg by mouth 2 (two) times daily. Hold evening dose if SBP <120   multivitamin with minerals Tabs tablet Take 1 tablet by mouth daily.   pantoprazole 40 MG tablet Commonly known as: PROTONIX Take 1 tablet (40 mg total) by mouth daily.   polyvinyl alcohol 1.4 % ophthalmic solution Commonly known as: LIQUIFILM TEARS Place 1 drop into both eyes every 4 (four) hours as needed for dry eyes.   Prucalopride Succinate 2 MG Tabs Take 1 tablet (2 mg total) by mouth daily.   sodium chloride 0.65 % Soln nasal spray Commonly known as: OCEAN Place 1 spray into both nostrils as needed for congestion.    tadalafil 5 MG tablet Commonly known as: CIALIS Take 1 tablet (5 mg total) by mouth daily.   triamcinolone cream 0.5 % Commonly known as: KENALOG Apply 1 application topically. Twice daily as needed   Ventolin HFA 108 (90 Base) MCG/ACT inhaler Generic drug: albuterol Inhale 2 puffs into the lungs every 4 (four) hours as needed for wheezing or shortness of breath.        Allergies:  Allergies  Allergen Reactions   Bacitracin Hives   Flagyl [Metronidazole] Itching   Neosporin Original [Bacitracin-Neomycin-Polymyxin]     Other reaction(s): Unknown   Neosporin [Neomycin-Bacitracin Zn-Polymyx] Hives   Penicillins  Itching    Has patient had a PCN reaction causing immediate rash, facial/tongue/throat swelling, SOB or lightheadedness with hypotension: No Has patient had a PCN reaction causing severe rash involving mucus membranes or skin necrosis: No Has patient had a PCN reaction that required hospitalization No Has patient had a PCN reaction occurring within the last 10 years: No If all of the above answers are "NO", then may proceed with Cephalosporin use.     Family History: Family History  Problem Relation Age of Onset   Allergic rhinitis Mother    Eczema Mother    Urticaria Mother    Asthma Father    Colon cancer Neg Hx    Colon polyps Neg Hx     Social History:  reports that he quit smoking about 21 years ago. His smoking use included cigarettes. He has a 34.00 pack-year smoking history. He has been exposed to tobacco smoke. He has never used smokeless tobacco. He reports that he does not drink alcohol and does not use drugs.   Physical Exam: BP (!) 164/83   Pulse 70   Ht '5\' 6"'$  (1.676 m)   Wt 148 lb 4.8 oz (67.3 kg)   BMI 23.94 kg/m   Constitutional:  Alert and oriented, No acute distress. HEENT: [place] AT, moist mucus membranes.  Trachea midline, no masses. Cardiovascular: No clubbing, cyanosis, or edema. Respiratory: Normal respiratory effort, no increased work  of breathing. GI: Abdomen is soft, nontender, nondistended, no abdominal masses GU: No CVA tenderness Skin: No rashes, bruises or suspicious lesions. Neurologic: Grossly intact, no focal deficits, moving all 4 extremities. Psychiatric: Normal mood and affect.  Laboratory Data: Lab Results  Component Value Date   WBC 6.1 04/21/2021   HGB 15.5 04/21/2021   HCT 46.1 04/21/2021   MCV 91.3 04/21/2021   PLT 202 04/21/2021    Lab Results  Component Value Date   CREATININE 0.80 04/05/2022    No results found for: "PSA"  No results found for: "TESTOSTERONE"  No results found for: "HGBA1C"  Urinalysis    Component Value Date/Time   APPEARANCEUR Clear 06/04/2022 1348   GLUCOSEU Negative 06/04/2022 1348   BILIRUBINUR Negative 06/04/2022 1348   PROTEINUR Negative 06/04/2022 1348   NITRITE Negative 06/04/2022 1348   LEUKOCYTESUR Negative 06/04/2022 1348    Lab Results  Component Value Date   LABMICR Comment 06/04/2022    Pertinent Imaging:  Assessment & Plan:    1. BPH with obstruction/lower urinary tract symptoms - cont daily Cialis   - Urinalysis, Routine w reflex microscopic  2. ED - cont sildenafil as needed No follow-ups on file.  Festus Aloe, MD  [facility]  [address] [place], [place] [address] [phone]

## 2022-12-04 LAB — PSA: Prostate Specific Ag, Serum: 1.2 ng/mL (ref 0.0–4.0)

## 2022-12-12 ENCOUNTER — Encounter (INDEPENDENT_AMBULATORY_CARE_PROVIDER_SITE_OTHER): Payer: Self-pay

## 2022-12-13 ENCOUNTER — Ambulatory Visit (INDEPENDENT_AMBULATORY_CARE_PROVIDER_SITE_OTHER): Payer: Medicare HMO | Admitting: Gastroenterology

## 2022-12-20 ENCOUNTER — Ambulatory Visit (INDEPENDENT_AMBULATORY_CARE_PROVIDER_SITE_OTHER): Payer: Medicare HMO | Admitting: Gastroenterology

## 2023-01-21 ENCOUNTER — Encounter (INDEPENDENT_AMBULATORY_CARE_PROVIDER_SITE_OTHER): Payer: Self-pay | Admitting: *Deleted

## 2023-01-21 ENCOUNTER — Telehealth (INDEPENDENT_AMBULATORY_CARE_PROVIDER_SITE_OTHER): Payer: Self-pay | Admitting: *Deleted

## 2023-01-21 ENCOUNTER — Ambulatory Visit (INDEPENDENT_AMBULATORY_CARE_PROVIDER_SITE_OTHER): Payer: Medicare HMO | Admitting: Gastroenterology

## 2023-01-21 ENCOUNTER — Encounter (INDEPENDENT_AMBULATORY_CARE_PROVIDER_SITE_OTHER): Payer: Self-pay | Admitting: Gastroenterology

## 2023-01-21 VITALS — BP 149/95 | HR 75 | Temp 98.4°F | Ht 66.0 in | Wt 151.3 lb

## 2023-01-21 DIAGNOSIS — K582 Mixed irritable bowel syndrome: Secondary | ICD-10-CM

## 2023-01-21 DIAGNOSIS — R1031 Right lower quadrant pain: Secondary | ICD-10-CM

## 2023-01-21 DIAGNOSIS — K219 Gastro-esophageal reflux disease without esophagitis: Secondary | ICD-10-CM

## 2023-01-21 DIAGNOSIS — R197 Diarrhea, unspecified: Secondary | ICD-10-CM

## 2023-01-21 DIAGNOSIS — R1032 Left lower quadrant pain: Secondary | ICD-10-CM | POA: Diagnosis not present

## 2023-01-21 MED ORDER — PANTOPRAZOLE SODIUM 40 MG PO TBEC: 40.0000 mg | DELAYED_RELEASE_TABLET | Freq: Every day | ORAL | 3 refills | Status: DC

## 2023-01-21 NOTE — Progress Notes (Unsigned)
Referring Provider: Dolores Patty, DO Primary Care Physician:  Dolores Patty, DO Primary GI Physician: Jenetta Downer   Chief Complaint  Patient presents with   Gastroesophageal Reflux    Follow up on GERD and IBS. Requesting refill on protonix. Would like to discuss  being on protonix long term. Reports pcp did not want him on it long term. Having tenderness in abdominal area. Change in bowel habits, bouts of diarrhea in late afternoon or evening. Discuss if its ok to lift weights due to having hemorrhoid issues in the past.    HPI:   Ronnie Patrick is a 72 y.o. male with past medical history of  lactose intolerance, red scrotum syndrome, HTN, HLD, GERD, hx of Lyme disease.   Patient presenting today for follow up  At last visit in June 2023, he reported that he sneezed one day and had terrible Right sided abdominal pain, he thought his inguinal hernia was back. Saw Dr. Constance Haw who did a CT scan, as above, which did not show evidence of recurrent hernia. His PCP sent him to physical therapy which did not help.  taking naproxen '500mg'$  BID, he is taking '500mg'$  tylenol BID in between. having 1-4 BMs per day most days. Stools are not very hard and tend to get looser as the day goes on. He states that he has not been happy on the motegrity since he started it on March 6th, though he tells me that when he first got off of docusate and senna, he was somewhat constipated and then felt that bowels were actually moving pretty well after that. questions why he was started on '1mg'$  of motegrity instead of 2 and wonders if the medication may be causing his pain as he read this was a side effect.   Recommend bowel prep, motegrity '2mg'$  daily, avoid nsaids, tylenol for pain.  Patient ended up stopping motegrity and restartin docusate and sennea in August, abd pain much better and constipation well controlled.   Present: GERD is well managed on protonix, states that this works well for him. He reports that his PCP  is not happy about him remaining on this long term. He states that he feels great on this medication. He is taking his PPI at night as his cialis was worsening his GERD at night.   He is having some tenderness of his abdomen intermittently. Pain is lower abdomen both RLQ and LLQ. Has been going on for the past few weeks to month. Uncertain of precipitating factors, having a BM does not seem to make pain better. He does take some naproxyn for other pain and wonders if this is helping some with his abdominal. Denies rectal bleeding or melena. Having a BM 2-4x/week, can go up to 5x/day sometimes. Stools range from formed then transition to softer/looser. He is taking senna and docusate.   Over the past few weeks, He has had a few episodes of severe diarrhea, watery in nature this has occurred 3 separate times with just one episode each time. It has occurred a few separate times, usually in the late afternoon or evening. Diet is the same, he eats the same snack each afternoon. He also feels that he has to move his bowels at time but very minimal amount of stool. Denies any antibiotics. He did recently wean off of pepcid that he was taking for some allergy issues. Has been on dupixent since last summer also for some dermatological issues.    Last Colonoscopy:04/13/19- Two 4 to 5  mm polyps at the hepatic flexure, removed with a cold snare. Resected and retrieved.serrated lesion in hepatic flexure and a tubular adenoma. Random biopsies neg. - External and internal hemorrhoids. - MILDLY Tortuous LEFT colon. - NO OBVIOUS SOURCE FOR DIARRHEA IDENTIFIED  Repeat in 5 years  Last Endoscopy:02/20/17 small hiatal hernia, gastritis-negative, benign gastric polyps  Past Medical History:  Diagnosis Date   Basal cell carcinoma (BCC)    skin cancer on scalp-been removed   Chronic pancreatitis ([place])    Exocrine pancreatic insufficiency    Gastritis    GERD (gastroesophageal reflux disease)    Hiatal hernia    HTN  (hypertension)    Hyperlipidemia    IBS (irritable bowel syndrome)    Jock itch    LAMISIL/TERBINAFINE REQUIRED   Lyme disease JUN 2016    Past Surgical History:  Procedure Laterality Date   BIOPSY  02/20/2017   Procedure: BIOPSY;  Surgeon: Danie Binder, MD;  [facility];  Service: Endoscopy;;  gastric and esophageal   BIOPSY  04/13/2019   Procedure: BIOPSY;  Surgeon: Danie Binder, MD;  [facility];  Service: Endoscopy;;  colon   COLONOSCOPY  DEC 2015 [facility]   [facility]   COLONOSCOPY WITH PROPOFOL N/A 04/13/2019   Procedure: COLONOSCOPY WITH PROPOFOL;  Surgeon: Danie Binder, MD;  [facility];  Service: Endoscopy;  Laterality: N/A;  8:30am   ESOPHAGOGASTRODUODENOSCOPY N/A 02/20/2017   Procedure: ESOPHAGOGASTRODUODENOSCOPY (EGD);  Surgeon: Danie Binder, MD;  [facility];  Service: Endoscopy;  Laterality: N/A;  215   EXCISIONAL HEMORRHOIDECTOMY     AGE 69-PAINFUL   FLEXIBLE SIGMOIDOSCOPY N/A 07/15/2015   Procedure: FLEXIBLE SIGMOIDOSCOPY;  Surgeon: Danie Binder, MD;  [facility];  Service: Endoscopy;  Laterality: N/A;  100 - moved to 12:00 - office to notify   HEMORRHOID BANDING N/A 07/15/2015   Procedure: HEMORRHOID BANDING;  Surgeon: Danie Binder, MD;  [facility];  Service: Endoscopy;  Laterality: N/A;   HERNIA REPAIR Bilateral 10/2020   INGUINAL HERNIA REPAIR Right 04/24/2021   Procedure: HERNIA REPAIR INGUINAL ADULT/ RECURRENT;  Surgeon: Virl Cagey, MD;  [facility];  Service: General;  Laterality: Right;   POLYPECTOMY  04/13/2019   Procedure: POLYPECTOMY;  Surgeon: Danie Binder, MD;  [facility];  Service: Endoscopy;;  colon   TONSILLECTOMY AND ADENOIDECTOMY     AS A CHILD   UPPER GASTROINTESTINAL ENDOSCOPY  DEC 2015 [facility]   GERD, HH    Current Outpatient Medications  Medication Sig Dispense Refill   acetaminophen (TYLENOL) 500 MG tablet Take 500 mg by mouth 2 (two) times daily.      acyclovir (ZOVIRAX) 400 MG tablet Take 400 mg by mouth 2 (two) times daily.     atorvastatin (LIPITOR) 40 MG tablet Take 40 mg by mouth daily.     calcium carbonate (TUMS EX) 750 MG chewable tablet Chew 1 tablet by mouth 3 (three) times daily as needed for heartburn.     fexofenadine (ALLEGRA) 180 MG tablet Take 180 mg by mouth at bedtime.     fluticasone (FLONASE) 50 MCG/ACT nasal spray Place 1 spray into both nostrils 2 (two) times daily.     metoprolol tartrate (LOPRESSOR) 25 MG tablet Take 25 mg by mouth 2 (two) times daily. Hold evening dose if SBP <120     Multiple Vitamin (MULTIVITAMIN WITH MINERALS) TABS tablet Take 1 tablet by mouth daily.  pantoprazole (PROTONIX) 40 MG tablet Take 1 tablet (40 mg total) by mouth daily. 90 tablet 3   polyvinyl alcohol (LIQUIFILM TEARS) 1.4 % ophthalmic solution Place 1 drop into both eyes every 4 (four) hours as needed for dry eyes.     sildenafil (VIAGRA) 50 MG tablet Take 1 tablet (50 mg total) by mouth daily as needed for erectile dysfunction. 30 tablet 5   sodium chloride (OCEAN) 0.65 % SOLN nasal spray Place 1 spray into both nostrils as needed for congestion.     tadalafil (CIALIS) 5 MG tablet Take 1 tablet (5 mg total) by mouth daily. 90 tablet 3   VENTOLIN HFA 108 (90 BASE) MCG/ACT inhaler Inhale 2 puffs into the lungs every 4 (four) hours as needed for wheezing or shortness of breath.      No current facility-administered medications for this visit.    Allergies as of 01/21/2023 - Review Complete 01/21/2023  Allergen Reaction Noted   Bacitracin Hives 02/28/2017   Flagyl [metronidazole] Itching 06/29/2015   Neosporin original [bacitracin-neomycin-polymyxin]  11/20/2021   Neosporin [neomycin-bacitracin zn-polymyx] Hives 02/28/2017   Penicillins Itching 06/29/2015    Family History  Problem Relation Age of Onset   Allergic rhinitis Mother    Eczema Mother    Urticaria Mother    Asthma Father    Colon cancer Neg Hx    Colon  polyps Neg Hx     Social History   Socioeconomic History   Marital status: Divorced    Spouse name: Not on file   Number of children: Not on file   Years of education: Not on file   Highest education level: Not on file  Occupational History   Not on file  Tobacco Use   Smoking status: Former    Packs/day: 1.00    Years: 34.00    Total pack years: 34.00    Types: Cigarettes    Quit date: 07/14/2001    Years since quitting: 21.5    Passive exposure: Past   Smokeless tobacco: Never   Tobacco comments:    Quit in 2002  Vaping Use   Vaping Use: Never used  Substance and Sexual Activity   Alcohol use: No    Alcohol/week: 0.0 standard drinks of alcohol   Drug use: No   Sexual activity: Yes  Other Topics Concern   Not on file  Social History Narrative   [place]. 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      2 SONS:  THEY LIVE IN [place] WITH EX-WIFE.   SEXUAL ORIENTATION: ATTRACTED TO MEN.   RARE ETOH OR SWEETS. HAS A TWIN BROTHER WITH DIABETES.   Social Determinants of Health   Financial Resource Strain: Not on file  Food Insecurity: Not on file  Transportation Needs: Not on file  Physical Activity: Not on file  Stress: Not on file  Social Connections: Not on file    Review of systems General: negative for malaise, night sweats, fever, chills, weight los Neck: Negative for lumps, goiter, pain and significant neck swelling Resp: Negative for cough, wheezing, dyspnea at rest CV: Negative for chest pain, leg swelling, palpitations, orthopnea GI: denies melena, hematochezia, nausea, vomiting, diarrhea, constipation, dysphagia, odyonophagia, early satiety or unintentional weight loss.  MSK: Negative for joint pain or swelling, back pain, and muscle pain. Derm: Negative for itching or rash Psych: Denies depression, anxiety, memory loss, confusion. No homicidal or suicidal  ideation.  Heme: Negative for prolonged bleeding, bruising easily, and swollen nodes. Endocrine: Negative for cold or heat intolerance, polyuria, polydipsia and goiter. Neuro: negative for tremor, gait imbalance, syncope and seizures. The remainder of the review of systems is noncontributory.  Physical Exam: BP (!) 157/85 (BP Location: Left Arm, Patient Position: Sitting, Cuff Size: Large)   Pulse 75   Temp 98.4 F (36.9 C) (Oral)   Ht '5\' 6"'$  (1.676 m)   Wt 151 lb 4.8 oz (68.6 kg)   BMI 24.42 kg/m  General:   Alert and oriented. No distress noted. Pleasant and cooperative.  Head:  Normocephalic and atraumatic. Eyes:  Conjuctiva clear without scleral icterus. Mouth:  Oral mucosa pink and moist. Good dentition. No lesions. Heart: Normal rate and rhythm, s1 and s2 heart sounds present.  Lungs: Clear lung sounds in all lobes. Respirations equal and unlabored. Abdomen:  +BS, soft, non-tender and non-distended. No rebound or guarding. No HSM or masses noted. Derm: No palmar erythema or jaundice Msk:  Symmetrical without gross deformities. Normal posture. Extremities:  Without edema. Neurologic:  Alert and  oriented x4 Psych:  Alert and cooperative. Normal mood and affect.  Invalid input(s): "6 MONTHS"   ASSESSMENT: Ronnie Patrick is a 72 y.o. male presenting today    PLAN:  Continue PPI daily  2. Pt to let me know if is intersted in TIF procedures  3. Stool studies and CT A/P with contrast 4. Continue current bowel regimen 5. Consider bentyl/levsin if the above workup is negative  All questions were answered, patient verbalized understanding and is in agreement with plan as outlined above.   Follow Up: 6 months   Presley Summerlin L. Alver Sorrow, MSN, APRN, AGNP-C Adult-Gerontology Nurse Practitioner [facility]

## 2023-01-21 NOTE — Telephone Encounter (Signed)
PA approved via [employer]. Auth#  [id number], DOS: 01/21/23-07/20/2023

## 2023-01-21 NOTE — Patient Instructions (Addendum)
It was nice to see you! Let's get a CT of the abdomen to rule out any underlying causes of your pain other than your IBS We will also check stool studies to rule out infection If these are both negative, we can consider trying levsin or bentyl, these are anti spasmodics that can work well for patients with IBS As discussed, please keep an eye on your blood pressure and speak with your PCP if this continues to be elevated Please continue with your protonix '40mg'$  once daily, refill has been sent, if you decide you are interested in the TIF procedure, please let us know You can continue to do your current bowel regimen of colace and docusate if this is working well for you   Follow up 6 months

## 2023-01-22 ENCOUNTER — Telehealth: Payer: Self-pay | Admitting: *Deleted

## 2023-01-22 DIAGNOSIS — R1032 Left lower quadrant pain: Secondary | ICD-10-CM | POA: Insufficient documentation

## 2023-01-22 DIAGNOSIS — R197 Diarrhea, unspecified: Secondary | ICD-10-CM | POA: Insufficient documentation

## 2023-01-22 NOTE — Telephone Encounter (Signed)
Patient called in inquiring about the oral contrast he will have to drink for his CT. He wants to know if it is absolutely necessary he drinks the oral contrast? He stated times before it has caused severe diarrhea from the oral contrast and he does not want that.   Also informed me to schedule his CT at [facility] instead of [facility] since they are booking so far out. Advised will send message regarding oral contrast to [place] and then I will get him rescheduled. He voiced understanding.  Please advise [place]! Thanks!

## 2023-01-22 NOTE — Telephone Encounter (Signed)
Called pt and made aware of Ronnie Patrick was fine with no contrast. CT scheduled for [facility]. 3/14, arrival 1:30pm, npo 4 hrs prior. Aware no oral contrast. [facility] had soober appts but for 5pm, 6pm and he did not want that late in the day.

## 2023-01-30 ENCOUNTER — Ambulatory Visit (HOSPITAL_BASED_OUTPATIENT_CLINIC_OR_DEPARTMENT_OTHER)
Admission: RE | Admit: 2023-01-30 | Discharge: 2023-01-30 | Disposition: A | Discharge status: Home / Self Care | Payer: Medicare HMO | Source: Ambulatory Visit | Attending: Gastroenterology | Admitting: Gastroenterology

## 2023-01-30 ENCOUNTER — Encounter (INDEPENDENT_AMBULATORY_CARE_PROVIDER_SITE_OTHER): Payer: Self-pay

## 2023-01-30 DIAGNOSIS — R1031 Right lower quadrant pain: Secondary | ICD-10-CM | POA: Diagnosis present

## 2023-01-30 DIAGNOSIS — R1032 Left lower quadrant pain: Secondary | ICD-10-CM | POA: Insufficient documentation

## 2023-01-30 LAB — POCT I-STAT CREATININE: Creatinine, Ser: 0.8 mg/dL (ref 0.61–1.24)

## 2023-01-30 MED ORDER — IOHEXOL 300 MG/ML  SOLN
100.0000 mL | Freq: Once | INTRAMUSCULAR | Status: AC | PRN
  Administered 2023-01-30: 85 mL via INTRAVENOUS

## 2023-02-04 ENCOUNTER — Other Ambulatory Visit (INDEPENDENT_AMBULATORY_CARE_PROVIDER_SITE_OTHER): Payer: Self-pay | Admitting: *Deleted

## 2023-02-04 MED ORDER — DICYCLOMINE HCL 10 MG PO CAPS: ORAL_CAPSULE | ORAL | 0 refills | Status: DC

## 2023-02-04 NOTE — Progress Notes (Signed)
Per [facility] send in bentyl 77m one q 8 hours prn #[phone]. Med sent to [facility] on [address].

## 2023-02-05 ENCOUNTER — Telehealth (INDEPENDENT_AMBULATORY_CARE_PROVIDER_SITE_OTHER): Payer: Self-pay | Admitting: *Deleted

## 2023-02-05 ENCOUNTER — Encounter (INDEPENDENT_AMBULATORY_CARE_PROVIDER_SITE_OTHER): Payer: Self-pay

## 2023-02-05 ENCOUNTER — Telehealth: Payer: Self-pay

## 2023-02-05 NOTE — Telephone Encounter (Signed)
Patient states the [employer] has been trying to reach the office for a rx for Cialis.  I returned patient's call and let him know that we have not received anything from them, fax number provided to patient will relay to [employer].

## 2023-02-05 NOTE — Telephone Encounter (Signed)
PA submitted through cover my meds for dicyclomine and it was approved from 12/17/22 -12/17/23. Letter of approval faxed to [employer].

## 2023-02-06 ENCOUNTER — Encounter (INDEPENDENT_AMBULATORY_CARE_PROVIDER_SITE_OTHER): Payer: Self-pay

## 2023-02-12 ENCOUNTER — Telehealth (INDEPENDENT_AMBULATORY_CARE_PROVIDER_SITE_OTHER): Payer: Self-pay | Admitting: *Deleted

## 2023-02-12 NOTE — Telephone Encounter (Signed)
2:20 PM returning call to patient  He did not answer --left message for a call back

## 2023-02-12 NOTE — Telephone Encounter (Signed)
Patient called back--question about survey easier way--I will email him a sticker so he can scan and this would be easier for him.  Would like to make sure his appts are around 1:30 this is easier for him--Mitzie is very kind however would like to talk about days before given an appt that really is hard for him.  CT he knows we can't help what [facility] has but maybe to help patient out we could offer going to [facility] to have better access --he found this out when his PCP needed this done.  Thanks for everything and he is very appreciative to have [facility] as his provider.

## 2023-02-28 ENCOUNTER — Other Ambulatory Visit (HOSPITAL_BASED_OUTPATIENT_CLINIC_OR_DEPARTMENT_OTHER): Payer: Medicare HMO

## 2023-03-05 ENCOUNTER — Ambulatory Visit (INDEPENDENT_AMBULATORY_CARE_PROVIDER_SITE_OTHER): Payer: Medicare HMO | Admitting: Gastroenterology

## 2023-03-05 ENCOUNTER — Encounter (INDEPENDENT_AMBULATORY_CARE_PROVIDER_SITE_OTHER): Payer: Self-pay | Admitting: Gastroenterology

## 2023-03-05 VITALS — BP 148/82 | HR 71 | Temp 97.5°F | Ht 66.0 in | Wt 152.6 lb

## 2023-03-05 DIAGNOSIS — K582 Mixed irritable bowel syndrome: Secondary | ICD-10-CM

## 2023-03-05 NOTE — Patient Instructions (Addendum)
We can continue with dicyclomine for now, you can try taking just before bed and again as soon as you wake up, you can eat something small such as a cracker or two with it. You can let me know what you decide about Elavil (amitriptyline), we would start at 10mg  each night I am providing some samples of a probiotic for you to try as well and information on CBT for IBS.   It was a pleasure to see you today. I want to create trusting relationships with patients and provide genuine, compassionate, and quality care. I truly value your feedback! please be on the lookout for a survey regarding your visit with me today. I appreciate your input about our visit and your time in completing this!    Jontay Maston L. Alver Sorrow, MSN, APRN, AGNP-C Adult-Gerontology Nurse Practitioner [facility]  Follow up 3 months

## 2023-03-05 NOTE — Progress Notes (Addendum)
Referring Provider: Lucila Maine, DO Primary Care Physician:  Lucila Maine, DO Primary GI Physician: Jenetta Downer   Chief Complaint  Patient presents with   Irritable Bowel Syndrome    Patient here today to follow up on his IBS. He says he alternates between Constipation and Diarrhea. Bentyl not working well, he says it makes him tired, he can't take more than twice per day as It makes him drowsy, can not drive while he takes it. He says it only helps about 30% of the time.   HPI:   Ronnie Patrick is a 72 y.o. male with past medical history of  lactose intolerance, red scrotum syndrome, HTN, HLD, GERD, hx of Lyme disease.   Patient presenting today for IBS.  At last visit 01/21/23, GERD is well managed on protonix. having some tenderness of his abdomen intermittently. Pain is lower abdomen both RLQ and LLQ. Has been going on for the past few weeks to month. Having a BM 2-4x/week, can go up to 5x/day sometimes. Stools range from formed then transition to softer/looser. He is taking senna and docusate. Over the past few weeks, He has had a few episodes of severe diarrhea, watery in nature this has occurred 3 separate times with just one episode each time.   Recommended continue PPI daily, stool studies if diarrhea persists, CT A/P with contrast, consider levsin/bentyl if CT negative.  CT A/P with contrast on 2/14 was unremarkable, started on bentyl 10mg  TID PRN.   Present:  Patient states having issues with updating stuff on e chart then not updated when he arrives to the office for his visit, this is very frustrating to him to have to repeat his information again.   He started dicyclomine once a day for 1 week then increased to BID the second week. Only taking dicyclomine twice a day as he is having dry mouth, somnolence, needing to nap when taking this. He has had to add more water. He can have as many loose stools on some days as before he took dicyclomine, other days he may feel more  constipated, however, he does notice some improvement in abdominal discomfort with dicyclomine. he Feels that abdominal pain is worse in the mornings when he gets up. He tried taking it in the morning and at bedtime but does not feel that it lasts throughout the night when taking before bed. Having more BMs later in the afternoon and evenings. He notes that he did not take dicyclomine one day and did notice a difference where he had more abdominal pain vs taking BID. He reports about 30-40% improvement in his symptoms taking dicyclomine. He is taking it twice a day on days he is at home but in going out he can only take it once a day as he does not want to drive while taking it. He does note at times of higher stress, he has more loose stools/abdominal discomfort. He is concerned that the dicyclomine is causing some side effects, while these are manageable, he wants to know if there are any other options to help manage his IBS better with potentially less side effects.    Last Colonoscopy:04/13/19- Two 4 to 5 mm polyps at the hepatic flexure, removed with a cold snare. Resected and retrieved.serrated lesion in hepatic flexure and a tubular adenoma. Random biopsies neg. - External and internal hemorrhoids. - MILDLY Tortuous LEFT colon. - NO OBVIOUS SOURCE FOR DIARRHEA IDENTIFIED Repeat in 5 years  Last Endoscopy:02/20/17 small hiatal hernia, gastritis-negative, benign  gastric polyps   Past Medical History:  Diagnosis Date   Basal cell carcinoma (BCC)    skin cancer on scalp-been removed   Chronic pancreatitis ([place])    Exocrine pancreatic insufficiency    Gastritis    GERD (gastroesophageal reflux disease)    Hiatal hernia    HTN (hypertension)    Hyperlipidemia    IBS (irritable bowel syndrome)    Jock itch    LAMISIL/TERBINAFINE REQUIRED   Lyme disease JUN 2016    Past Surgical History:  Procedure Laterality Date   BIOPSY  02/20/2017   Procedure: BIOPSY;  Surgeon: Danie Binder, MD;   [facility];  Service: Endoscopy;;  gastric and esophageal   BIOPSY  04/13/2019   Procedure: BIOPSY;  Surgeon: Danie Binder, MD;  [facility];  Service: Endoscopy;;  colon   COLONOSCOPY  DEC 2015 [facility]   [facility]   COLONOSCOPY WITH PROPOFOL N/A 04/13/2019   Procedure: COLONOSCOPY WITH PROPOFOL;  Surgeon: Danie Binder, MD;  [facility];  Service: Endoscopy;  Laterality: N/A;  8:30am   ESOPHAGOGASTRODUODENOSCOPY N/A 02/20/2017   Procedure: ESOPHAGOGASTRODUODENOSCOPY (EGD);  Surgeon: Danie Binder, MD;  [facility];  Service: Endoscopy;  Laterality: N/A;  215   EXCISIONAL HEMORRHOIDECTOMY     AGE 67-PAINFUL   FLEXIBLE SIGMOIDOSCOPY N/A 07/15/2015   Procedure: FLEXIBLE SIGMOIDOSCOPY;  Surgeon: Danie Binder, MD;  [facility];  Service: Endoscopy;  Laterality: N/A;  100 - moved to 12:00 - office to notify   HEMORRHOID BANDING N/A 07/15/2015   Procedure: HEMORRHOID BANDING;  Surgeon: Danie Binder, MD;  [facility];  Service: Endoscopy;  Laterality: N/A;   HERNIA REPAIR Bilateral 10/2020   INGUINAL HERNIA REPAIR Right 04/24/2021   Procedure: HERNIA REPAIR INGUINAL ADULT/ RECURRENT;  Surgeon: Virl Cagey, MD;  Location: [facility];  Service: General;  Laterality: Right;   POLYPECTOMY  04/13/2019   Procedure: POLYPECTOMY;  Surgeon: Danie Binder, MD;  [facility];  Service: Endoscopy;;  colon   TONSILLECTOMY AND ADENOIDECTOMY     AS A CHILD   UPPER GASTROINTESTINAL ENDOSCOPY  DEC 2015 [facility]   GERD, HH    Current Outpatient Medications  Medication Sig Dispense Refill   acetaminophen (TYLENOL) 500 MG tablet Take 500 mg by mouth 2 (two) times daily.     acyclovir (ZOVIRAX) 400 MG tablet Take 400 mg by mouth 2 (two) times daily.     atorvastatin (LIPITOR) 40 MG tablet Take 40 mg by mouth daily.     calcium carbonate (TUMS EX) 750 MG chewable tablet Chew 1 tablet by mouth 3 (three) times daily as needed for  heartburn.     dicyclomine (BENTYL) 10 MG capsule Take one tid prn 60 capsule 0   docusate sodium (COLACE) 100 MG capsule Take 300 mg by mouth daily.     fexofenadine (ALLEGRA) 180 MG tablet Take 180 mg by mouth daily.     fluticasone (FLONASE) 50 MCG/ACT nasal spray Place 1 spray into both nostrils 2 (two) times daily.     levocetirizine (XYZAL) 5 MG tablet Take 5 mg by mouth every evening.     metoprolol tartrate (LOPRESSOR) 25 MG tablet Take 25 mg by mouth 2 (two) times daily. Hold evening dose if SBP <120     Multiple Vitamin (MULTIVITAMIN WITH MINERALS) TABS tablet Take 1 tablet by mouth daily.     naproxen sodium (ALEVE) 220 MG tablet Take  440 mg by mouth daily as needed. Plus an additional 220 mg twelve hours later.     pantoprazole (PROTONIX) 40 MG tablet Take 1 tablet (40 mg total) by mouth daily. 90 tablet 3   polyvinyl alcohol (LIQUIFILM TEARS) 1.4 % ophthalmic solution Place 1 drop into both eyes every 4 (four) hours as needed for dry eyes.     senna (SENOKOT) 8.6 MG TABS tablet Take 4 tablets by mouth at bedtime.     sildenafil (VIAGRA) 50 MG tablet Take 1 tablet (50 mg total) by mouth daily as needed for erectile dysfunction. 30 tablet 5   sodium chloride (OCEAN) 0.65 % SOLN nasal spray Place 1 spray into both nostrils as needed for congestion.     tadalafil (CIALIS) 5 MG tablet Take 1 tablet (5 mg total) by mouth daily. 90 tablet 3   VENTOLIN HFA 108 (90 BASE) MCG/ACT inhaler Inhale 2 puffs into the lungs every 4 (four) hours as needed for wheezing or shortness of breath.      No current facility-administered medications for this visit.    Allergies as of 03/05/2023 - Review Complete 03/05/2023  Allergen Reaction Noted   Bacitracin Hives 02/28/2017   Flagyl [metronidazole] Itching 06/29/2015   Neosporin original [bacitracin-neomycin-polymyxin]  11/20/2021   Neosporin [neomycin-bacitracin zn-polymyx] Hives 02/28/2017   Penicillins Itching 06/29/2015    Family History   Problem Relation Age of Onset   Allergic rhinitis Mother    Eczema Mother    Urticaria Mother    Asthma Father    Colon cancer Neg Hx    Colon polyps Neg Hx     Social History   Socioeconomic History   Marital status: Divorced    Spouse name: Not on file   Number of children: Not on file   Years of education: Not on file   Highest education level: Not on file  Occupational History   Not on file  Tobacco Use   Smoking status: Former    Packs/day: 1.00    Years: 34.00    Additional pack years: 0.00    Total pack years: 34.00    Types: Cigarettes    Quit date: 07/14/2001    Years since quitting: 21.6    Passive exposure: Past   Smokeless tobacco: Never   Tobacco comments:    Quit in 2002  Vaping Use   Vaping Use: Never used  Substance and Sexual Activity   Alcohol use: No    Alcohol/week: 0.0 standard drinks of alcohol   Drug use: No   Sexual activity: Yes  Other Topics Concern   Not on file  Social History Narrative   [place]. USED TO LOVE TO VACATION IN [place]: [place], 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      2 SONS:  THEY LIVE IN [place] WITH EX-WIFE.   SEXUAL ORIENTATION: ATTRACTED TO MEN.   RARE ETOH OR SWEETS. HAS A TWIN BROTHER WITH DIABETES.   Social Determinants of Health   Financial Resource Strain: Not on file  Food Insecurity: Not on file  Transportation Needs: Not on file  Physical Activity: Not on file  Stress: Not on file  Social Connections: Not on file   Review of systems General: negative for malaise, night sweats, fever, chills, weight loss Neck: Negative for lumps, goiter, pain and significant neck swelling Resp: Negative for cough, wheezing, dyspnea at rest CV: Negative for chest pain, leg swelling, palpitations, orthopnea GI: denies melena, hematochezia,  nausea, vomiting, constipation, dysphagia, odyonophagia, early satiety or unintentional weight loss. +loose stools +abdominal  discomfort  MSK: Negative for joint pain or swelling, back pain, and muscle pain. Derm: Negative for itching or rash Psych: Denies depression, anxiety, memory loss, confusion. No homicidal or suicidal ideation.  Heme: Negative for prolonged bleeding, bruising easily, and swollen nodes. Endocrine: Negative for cold or heat intolerance, polyuria, polydipsia and goiter. Neuro: negative for tremor, gait imbalance, syncope and seizures. The remainder of the review of systems is noncontributory.  Physical Exam: BP (!) 148/82 (BP Location: Left Arm, Patient Position: Sitting, Cuff Size: Normal)   Pulse 71   Temp (!) 97.5 F (36.4 C) (Temporal)   Ht 5\' 6"  (1.676 m)   Wt 152 lb 9.6 oz (69.2 kg)   BMI 24.63 kg/m  General:   Alert and oriented. No distress noted. Pleasant and cooperative.  Head:  Normocephalic and atraumatic. Eyes:  Conjuctiva clear without scleral icterus. Mouth:  Oral mucosa pink and moist. Good dentition. No lesions. Heart: Normal rate and rhythm, s1 and s2 heart sounds present.  Lungs: Clear lung sounds in all lobes. Respirations equal and unlabored. Abdomen:  +BS, soft, non-tender and non-distended. No rebound or guarding. No HSM or masses noted. Derm: No palmar erythema or jaundice Msk:  Symmetrical without gross deformities. Normal posture. Extremities:  Without edema. Neurologic:  Alert and  oriented x4 Psych:  Alert and cooperative. Normal mood and affect.  Invalid input(s): "6 MONTHS"   ASSESSMENT: Ronnie Patrick is a 72 y.o. male presenting today for follow up of IBS, concerns about dicyclomine.  Patient started on dicyclomine 3 times daily as needed back in February, has noticed improvement in his  symptoms however he is having some mild side effects to include somnolence and dry mouth.  While the side effects are mild and can be dealt with patient queries if there is anything else that could potentially help with his symptoms without causing side effects.  We did  discuss that dicyclomine is known to cause the side effects he is having and while these are not dangerous they certainly can be frustrating but also can be easily managed.  We discussed that irritable bowel syndrome can be highly affected by anxiety and stress as well as diet.  He was important to manage anxiety and stress as well as maintain healthy diet avoiding foods that tend to cause worsening symptoms.  If he does not want to continue on dicyclomine due to side effects we can consider low-dose TCA such as Elavil that he would take at night, however I did discuss there are also potential side effects with this medication.  He notes he has been on this medicine in the past for his depression though it has been many years, he feels he tolerated the medication well then.  He prefers to do more research about Elavil and potential side effects before considering this as a treatment option for his IBS.  I also discussed cognitive behavioral therapy with the patient with regards to his IBS symptoms as some newer data shows that this can be helpful in managing the gut brain axis as it correlates with IBS symptoms.  I will provide him with a handout on this that may be potential option for him if he is interested in CBT therapy.  Patient would like to do some further research and let me know which direction he would like to proceed in regards to managing his IBS.  I will also add some probiotic  samples today as this could also provide some potential benefits for him. For now he can continue dicyclomine 10mg , taking one dose first thing in the morning and a second dose just prior to bedtime.    PLAN:  Continue dicyclomine 10mg , take first thing in am and at bedtime 2. probiotic samlpes provided  3. Pt to consider Elavil 10mg  QHS-will make me aware if he wishes to start this 4. CBT handout given to patient 5. Continue with good stress management, avoiding trigger foods  All questions were answered, patient  verbalized understanding and is in agreement with plan as outlined above.    Follow Up: 3 months   Ronnie Patrick L. Alver Sorrow, MSN, APRN, AGNP-C Adult-Gerontology Nurse Practitioner [facility].  I have reviewed the note and agree with the APP's assessment as described in this progress note  Maylon Peppers, MD Gastroenterology and Hepatology [facility]

## 2023-03-07 ENCOUNTER — Other Ambulatory Visit (HOSPITAL_BASED_OUTPATIENT_CLINIC_OR_DEPARTMENT_OTHER): Payer: Medicare HMO

## 2023-03-07 ENCOUNTER — Other Ambulatory Visit (HOSPITAL_COMMUNITY): Payer: Medicare HMO

## 2023-03-10 ENCOUNTER — Encounter (INDEPENDENT_AMBULATORY_CARE_PROVIDER_SITE_OTHER): Payer: Self-pay

## 2023-03-11 ENCOUNTER — Telehealth: Payer: Self-pay | Admitting: *Deleted

## 2023-03-11 ENCOUNTER — Other Ambulatory Visit (INDEPENDENT_AMBULATORY_CARE_PROVIDER_SITE_OTHER): Payer: Self-pay | Admitting: Gastroenterology

## 2023-03-11 ENCOUNTER — Telehealth (INDEPENDENT_AMBULATORY_CARE_PROVIDER_SITE_OTHER): Payer: Self-pay

## 2023-03-11 ENCOUNTER — Other Ambulatory Visit (INDEPENDENT_AMBULATORY_CARE_PROVIDER_SITE_OTHER): Payer: Self-pay

## 2023-03-11 DIAGNOSIS — K582 Mixed irritable bowel syndrome: Secondary | ICD-10-CM

## 2023-03-11 MED ORDER — LUBIPROSTONE 24 MCG PO CAPS: 24.0000 ug | ORAL_CAPSULE | Freq: Two times a day (BID) | ORAL | 3 refills | Status: DC

## 2023-03-11 NOTE — Telephone Encounter (Signed)
I have called [facility] cancelled the rx with Beth and I have resubmitted the rx to the [facility] in [place] at patient's request. Prior Auth approved.

## 2023-03-11 NOTE — Telephone Encounter (Signed)
Date: 03/11/2023 [facility] [place] [address] Member Name: WILLSON GRATE Member ID Number: [id number] Thank you for trusting your Medicare prescription drug coverage to [place] (HMO-POS). As our member, we want to help you get the most value from your prescription drug coverage and help you understand how your coverage works. As a member of [employer] (HMO-POS), we are pleased to inform you that, upon review of the information provided by you or your doctor, we have approved the requested coverage for the following prescription drug(s): LUBIPROSTONE Capsule Type of coverage approved: Non-Formulary This approval authorizes your coverage from 12/17/2022 - 12/17/2023, unless we notify you otherwise, and as long as the following conditions apply: ? you remain enrolled in our Medicare Part D prescription drug plan, ? your physician or other prescriber continues to prescribe the medication for you, and ? the medication continues to be safe for treating your condition. Depending upon the strength and/or formulation of the drug prescribed by your physician, different quantity limits or safety edits may apply in the future. If you have not already filled your prescription for this approved drug, you may do so at a participating network pharmacy.

## 2023-03-11 NOTE — Telephone Encounter (Signed)
Date: 03/11/2023 [facility] [place] [address] Member Name: STANLEY MERLE Member ID Number: [id number] Thank you for trusting your Medicare prescription drug coverage to [place] (HMO-POS). As our member, we want to help you get the most value from your prescription drug coverage and help you understand how your coverage works. As a member of [employer] (HMO-POS), we are pleased to inform you that, upon review of the information provided by you or your doctor, we have approved the requested coverage for the following prescription drug(s): LUBIPROSTONE Capsule Type of coverage approved: Non-Formulary This approval authorizes your coverage from 12/17/2022 - 12/17/2023, unless we notify you otherwise, and as long as the following conditions apply: ? you remain enrolled in our Medicare Part D prescription drug plan, ? your physician or other prescriber continues to prescribe the medication for you, and ? the medication continues to be safe for treating your condition. Depending upon the strength and/or formulation of the drug prescribed by your physician, different quantity limits or safety edits may apply in the future. If you have not already filled your prescription for this approved drug, you may do so at a participating network pharmacy.

## 2023-03-11 NOTE — Telephone Encounter (Signed)
Thanks, I have submitted through Cover My Meds.

## 2023-03-11 NOTE — Telephone Encounter (Signed)
Received PA for Lubiprostone 24 mcg.

## 2023-03-11 NOTE — Telephone Encounter (Signed)
Your welcome.

## 2023-03-13 ENCOUNTER — Other Ambulatory Visit: Payer: Self-pay

## 2023-03-13 MED ORDER — TADALAFIL 5 MG PO TABS: 5.0000 mg | ORAL_TABLET | Freq: Every day | ORAL | 3 refills | Status: DC

## 2023-03-13 NOTE — Progress Notes (Signed)
[facility] specialty pharmacy needs a printed rx for cialis.  Rx printed and faxed to pharmacy per patient request.

## 2023-03-29 ENCOUNTER — Encounter (INDEPENDENT_AMBULATORY_CARE_PROVIDER_SITE_OTHER): Payer: Self-pay

## 2023-04-14 ENCOUNTER — Encounter (INDEPENDENT_AMBULATORY_CARE_PROVIDER_SITE_OTHER): Payer: Self-pay

## 2023-05-07 ENCOUNTER — Ambulatory Visit (INDEPENDENT_AMBULATORY_CARE_PROVIDER_SITE_OTHER): Payer: Medicare HMO | Admitting: Gastroenterology

## 2023-05-07 ENCOUNTER — Encounter (INDEPENDENT_AMBULATORY_CARE_PROVIDER_SITE_OTHER): Payer: Self-pay | Admitting: Gastroenterology

## 2023-05-07 VITALS — BP 135/82 | HR 62 | Ht 66.0 in | Wt 152.3 lb

## 2023-05-07 DIAGNOSIS — K582 Mixed irritable bowel syndrome: Secondary | ICD-10-CM | POA: Diagnosis not present

## 2023-05-07 NOTE — Patient Instructions (Addendum)
Please research Ronnie Patrick and start on the samples if you feel comfortable doing so Please give them about 2 weeks and let me know how you are doing on this In regards to the lower right pelvic pain, I do not feel this is GI related and recommend follow up with your PCP regarding this to rule out other causes such as urinary related issues  We have a follow up scheduled in June, I will plan to see you then to check in on how you are feeling

## 2023-05-07 NOTE — Progress Notes (Addendum)
Referring Provider: Sherlyn Hay, DO Primary Care Physician:  Sherlyn Hay, DO Primary GI Physician: Levon Hedger   Chief Complaint  Patient presents with   Abdominal Pain    Abdominal pain started 2 -3 weeks ago. Worse last 3 days. Having a little bit of nausea. Has both diarrhea and constipation. Last couple days diarrhea has been worse.    HPI:   Ronnie Patrick is a 72 y.o. male with past medical history of  lactose intolerance, red scrotum syndrome, HTN, HLD, GERD, hx of Lyme disease, IBS  Patient presenting today for abdominal pain/IBS-M.  Last seen march 2024, at that time, started dicyclomine once a day for 1 week then increased to BID the second week. Only taking dicyclomine twice a day as he is having dry mouth, somnolence, needing to nap when taking this. He can have as many loose stools on some days as before he took dicyclomine, other days he may feel more constipated, however, he does notice some improvement in abdominal discomfort with dicyclomine. abdominal pain is worse in the mornings when he gets up. He tried taking it in the morning and at bedtime but does not feel that it lasts throughout the night when taking before bed.  He notes that he did not take dicyclomine one day and did notice a difference where he had more abdominal pain vs taking BID. He reports about 30-40% improvement in his symptoms taking dicyclomine. He does note at times of higher stress, he has more loose stools/abdominal discomfort. He is concerned that the dicyclomine is causing some side effects, while these are manageable, he wants to know if there are any other options to help manage his IBS better with potentially less side effects.    Recommended continue dicyclomine, probiotic, consider elavil, CBT handout given, good stress management.  Present:  Patient reports some RLQ pain down low near pubic bone and then some LLQ and RLQ pain. He notes that lower RLQ pain happened for the past few days.  The RLQ and LLQ pain have been ongoing for weeks. He noted some improvement in abdominal pain after he came off of senna and started [place] but this pain has since returned. Up until the last few days he was having less BMs and harder stools. Over the past week he has had 1-5 BMs, with some episodes being diarrhea some days. He reports that stools are watery and explosive when this occurs. He notes that the odor from his stools is as bad as when he has contrast with CT. He denies any changes in his diet, other medications. Endorses maybe some more stress. He reports he was on [place] a long time ago and took it for a while which worked well for him, he came off of it then as it stopped working. Linzess did not work for him. Did trulance after that which worked but then caused diarrhea. He does not think a lower dose of amitiza would work for him. He notes when he first started [place] he was only having diarrhea 1-2x/week. No rectal bleeding, melena, weight loss.   CT A/P with contrast 01/30/23: No acute findings in the abdomen or pelvis. Specifically, no findings to explain the patient's history of lower abdominal pain. 2. Stable 17 mm right adrenal adenoma. No followup imaging is recommended. 3. Prostatomegaly. 4.  Aortic Atherosclerosis  Last Colonoscopy:04/13/19- Two 4 to 5 mm polyps at the hepatic flexure, removed with a cold snare. Resected and retrieved.serrated lesion in hepatic flexure and  a tubular adenoma. Random biopsies neg. - External and internal hemorrhoids. - MILDLY Tortuous LEFT colon. - NO OBVIOUS SOURCE FOR DIARRHEA IDENTIFIED Repeat in 5 years  Last Endoscopy:02/20/17 small hiatal hernia, gastritis-negative, benign gastric polyps:    Past Medical History:  Diagnosis Date   Basal cell carcinoma (BCC)    skin cancer on scalp-been removed   Chronic pancreatitis (HCC)    Exocrine pancreatic insufficiency    Gastritis    GERD (gastroesophageal reflux disease)    Hiatal hernia     HTN (hypertension)    Hyperlipidemia    IBS (irritable bowel syndrome)    Jock itch    LAMISIL/TERBINAFINE REQUIRED   Lyme disease JUN 2016    Past Surgical History:  Procedure Laterality Date   BIOPSY  02/20/2017   Procedure: BIOPSY;  Surgeon: West Bali, MD;  [facility];  Service: Endoscopy;;  gastric and esophageal   BIOPSY  04/13/2019   Procedure: BIOPSY;  Surgeon: West Bali, MD;  [facility];  Service: Endoscopy;;  colon   COLONOSCOPY  DEC 2015 [facility]   [facility]   COLONOSCOPY WITH PROPOFOL N/A 04/13/2019   Procedure: COLONOSCOPY WITH PROPOFOL;  Surgeon: West Bali, MD;  [facility];  Service: Endoscopy;  Laterality: N/A;  8:30am   ESOPHAGOGASTRODUODENOSCOPY N/A 02/20/2017   Procedure: ESOPHAGOGASTRODUODENOSCOPY (EGD);  Surgeon: West Bali, MD;  [facility];  Service: Endoscopy;  Laterality: N/A;  215   EXCISIONAL HEMORRHOIDECTOMY     AGE 63-PAINFUL   FLEXIBLE SIGMOIDOSCOPY N/A 07/15/2015   Procedure: FLEXIBLE SIGMOIDOSCOPY;  Surgeon: West Bali, MD;  [facility];  Service: Endoscopy;  Laterality: N/A;  100 - moved to 12:00 - office to notify   HEMORRHOID BANDING N/A 07/15/2015   Procedure: HEMORRHOID BANDING;  Surgeon: West Bali, MD;  [facility];  Service: Endoscopy;  Laterality: N/A;   HERNIA REPAIR Bilateral 10/2020   INGUINAL HERNIA REPAIR Right 04/24/2021   Procedure: HERNIA REPAIR INGUINAL ADULT/ RECURRENT;  Surgeon: Lucretia Roers, MD;  Location: [facility];  Service: General;  Laterality: Right;   POLYPECTOMY  04/13/2019   Procedure: POLYPECTOMY;  Surgeon: West Bali, MD;  [facility];  Service: Endoscopy;;  colon   TONSILLECTOMY AND ADENOIDECTOMY     AS A CHILD   UPPER GASTROINTESTINAL ENDOSCOPY  DEC 2015 [facility]   GERD, HH    Current Outpatient Medications  Medication Sig Dispense Refill   acetaminophen (TYLENOL) 500 MG tablet Take 500 mg by mouth 2 (two) times  daily.     acyclovir (ZOVIRAX) 400 MG tablet Take 400 mg by mouth 2 (two) times daily.     atorvastatin (LIPITOR) 40 MG tablet Take 40 mg by mouth daily.     calcium carbonate (TUMS EX) 750 MG chewable tablet Chew 1 tablet by mouth 3 (three) times daily as needed for heartburn.     docusate sodium (COLACE) 100 MG capsule Take 300 mg by mouth daily.     Dupilumab (DUPIXENT) 300 MG/2ML SOPN Inject 300 mg every 2 weeks by subcutaneous route.     fexofenadine (ALLEGRA) 180 MG tablet Take 180 mg by mouth daily.     fluticasone (FLONASE) 50 MCG/ACT nasal spray Place 1 spray into both nostrils 2 (two) times daily.     levocetirizine (XYZAL) 5 MG tablet Take 5 mg by mouth every evening.     lubiprostone (AMITIZA) 24 MCG capsule Take 1 capsule (24  mcg total) by mouth 2 (two) times daily with a meal. 60 capsule 3   metoprolol tartrate (LOPRESSOR) 25 MG tablet Take 25 mg by mouth 2 (two) times daily. Hold evening dose if SBP <120     Multiple Vitamin (MULTIVITAMIN WITH MINERALS) TABS tablet Take 1 tablet by mouth daily.     naproxen sodium (ALEVE) 220 MG tablet Take 440 mg by mouth daily as needed. Plus an additional 220 mg twelve hours later.     pantoprazole (PROTONIX) 40 MG tablet Take 1 tablet (40 mg total) by mouth daily. 90 tablet 3   polyvinyl alcohol (LIQUIFILM TEARS) 1.4 % ophthalmic solution Place 1 drop into both eyes every 4 (four) hours as needed for dry eyes.     senna (SENOKOT) 8.6 MG TABS tablet Take 4 tablets by mouth at bedtime.     sildenafil (VIAGRA) 50 MG tablet Take 1 tablet (50 mg total) by mouth daily as needed for erectile dysfunction. 30 tablet 5   sodium chloride (OCEAN) 0.65 % SOLN nasal spray Place 1 spray into both nostrils as needed for congestion.     tadalafil (CIALIS) 5 MG tablet Take 1 tablet (5 mg total) by mouth daily. 90 tablet 3   VENTOLIN HFA 108 (90 BASE) MCG/ACT inhaler Inhale 2 puffs into the lungs every 4 (four) hours as needed for wheezing or shortness of breath.       No current facility-administered medications for this visit.    Allergies as of 05/07/2023 - Review Complete 05/07/2023  Allergen Reaction Noted   Bacitracin Hives 02/28/2017   Flagyl [metronidazole] Itching 06/29/2015   Neosporin original [bacitracin-neomycin-polymyxin]  11/20/2021   Neosporin [neomycin-bacitracin zn-polymyx] Hives 02/28/2017   Penicillins Itching 06/29/2015    Family History  Problem Relation Age of Onset   Allergic rhinitis Mother    Eczema Mother    Urticaria Mother    Asthma Father    Colon cancer Neg Hx    Colon polyps Neg Hx     Social History   Socioeconomic History   Marital status: Divorced    Spouse name: Not on file   Number of children: Not on file   Years of education: Not on file   Highest education level: Not on file  Occupational History   Not on file  Tobacco Use   Smoking status: Former    Packs/day: 1.00    Years: 34.00    Additional pack years: 0.00    Total pack years: 34.00    Types: Cigarettes    Quit date: 07/14/2001    Years since quitting: 21.8    Passive exposure: Past   Smokeless tobacco: Never   Tobacco comments:    Quit in 2002  Vaping Use   Vaping Use: Never used  Substance and Sexual Activity   Alcohol use: No    Alcohol/week: 0.0 standard drinks of alcohol   Drug use: No   Sexual activity: Yes  Other Topics Concern   Not on file  Social History Narrative   ORIGINALLY FROM [place]. USED TO LOVE TO VACATION IN [place]: [place], [place].    JOB: RESEARCH TECHNICIAN, DEGREE IN COMPUTER SCIENCE, SOLD INSURANCE.      2 SONS:  THEY LIVE IN [place] WITH EX-WIFE.   SEXUAL ORIENTATION: ATTRACTED TO MEN.   RARE ETOH OR SWEETS. HAS A TWIN BROTHER WITH DIABETES.   Social Determinants of Health   Financial Resource Strain: Not on file  Food Insecurity: Not on file  Transportation Needs: Not  on file  Physical Activity: Not on file  Stress: Not on file  Social Connections: Not on file   Review of systems General:  negative for malaise, night sweats, fever, chills, weight loss Neck: Negative for lumps, goiter, pain and significant neck swelling Resp: Negative for cough, wheezing, dyspnea at rest CV: Negative for chest pain, leg swelling, palpitations, orthopnea GI: denies melena, hematochezia, nausea, vomiting, dysphagia, odyonophagia, early satiety or unintentional weight loss. +diarrhea +constipation +abdominal pain  MSK: Negative for joint pain or swelling, back pain, and muscle pain. Derm: Negative for itching or rash Psych: Denies depression, anxiety, memory loss, confusion. No homicidal or suicidal ideation.  Heme: Negative for prolonged bleeding, bruising easily, and swollen nodes. Endocrine: Negative for cold or heat intolerance, polyuria, polydipsia and goiter. Neuro: negative for tremor, gait imbalance, syncope and seizures. The remainder of the review of systems is noncontributory.  Physical Exam: There were no vitals taken for this visit. General:   Alert and oriented. No distress noted. Pleasant and cooperative.  Head:  Normocephalic and atraumatic. Eyes:  Conjuctiva clear without scleral icterus. Mouth:  Oral mucosa pink and moist. Good dentition. No lesions. Heart: Normal rate and rhythm, s1 and s2 heart sounds present.  Lungs: Clear lung sounds in all lobes. Respirations equal and unlabored. Abdomen:  +BS, soft, non-tender and non-distended. No rebound or guarding. No HSM or masses noted. Derm: No palmar erythema or jaundice Msk:  Symmetrical without gross deformities. Normal posture. Extremities:  Without edema. Neurologic:  Alert and  oriented x4 Psych:  Alert and cooperative. Normal mood and affect.  Invalid input(s): "6 MONTHS"   ASSESSMENT: Ronnie Patrick is a 72 y.o. male presenting today for IBS-M and abdominal pain.  IBS-M: has been difficult to manage thus far. Patient has failed multiple prescription strength medications for constipation.  He does not do well on  over-the-counter therapies alone.  he has intermittent abdominal pain for which he has been imaged via CT scan multiple times over the past few years without any remarkable findings.  He is currently on Amitiza and now having more diarrhea, though notes over the past few days prior to diarrhea beginning he had harder less frequent stools.  I have discussed TCA therapy as well as cognitive behavioral therapy to help manage symptoms of his IBS in the past however he was not interested in proceeding with these.  At this point there are few options left to manage his symptoms which I discussed with the patient.  I did recommend that we try Ibsrela to see if this will be a good option for him.  He is amenable to doing further research of this on his own and starting the medication if he feels comfortable after reviewing side effects, etc.  Will provide samples of Ibsrela to have on hand if he wishes to start this.  He will let me know if this is working and we can send a prescription for him.   In regards to his lower Right pelvic/groin pain, I do not feel this is GI related, no hernias present in this area, he denies urinary symptoms though I did advise him to follow up with PCP for possible further evaluation.    PLAN:  Pt to research IBsrela  2. Will provide some samples of ibsrela  3. Repeat Colonoscopy April 2025  All questions were answered, patient verbalized understanding and is in agreement with plan as outlined above.   Follow Up: Has f/u in June   Ronnie Porto L. Tyese Finken,  MSN, APRN, AGNP-C Adult-Gerontology Nurse Practitioner [facility]  I have reviewed the note and agree with the APP's assessment as described in this progress note  Katrinka Blazing, MD Gastroenterology and Hepatology [facility]

## 2023-05-14 ENCOUNTER — Other Ambulatory Visit (INDEPENDENT_AMBULATORY_CARE_PROVIDER_SITE_OTHER): Payer: Self-pay | Admitting: Gastroenterology

## 2023-05-14 ENCOUNTER — Encounter (INDEPENDENT_AMBULATORY_CARE_PROVIDER_SITE_OTHER): Payer: Self-pay

## 2023-05-14 MED ORDER — IBSRELA 50 MG PO TABS: 50.0000 mg | ORAL_TABLET | Freq: Two times a day (BID) | ORAL | 3 refills | Status: DC

## 2023-05-15 ENCOUNTER — Telehealth (INDEPENDENT_AMBULATORY_CARE_PROVIDER_SITE_OTHER): Payer: Self-pay

## 2023-05-15 ENCOUNTER — Encounter (INDEPENDENT_AMBULATORY_CARE_PROVIDER_SITE_OTHER): Payer: Self-pay

## 2023-05-15 NOTE — Telephone Encounter (Signed)
Date: 05/15/2023 [facility] [address] [place], [place] [address] Member Name: KEILIN NASH Member ID Number: [id number] Thank you for trusting your Medicare prescription drug coverage to [facility] Select Plan (HMO-POS). As our member, we want to help you get the most value from your prescription drug coverage and help you understand how your coverage works. As a member of [employer] (HMO-POS), we are pleased to inform you that, upon review of the information provided by you or your doctor, we have approved the requested coverage for the following prescription drug(s): IBSRELA Tablet Type of coverage approved: Non-Formulary This approval authorizes your coverage from 12/17/2022 - 12/17/2023, unless we notify you otherwise, and as long as the following conditions apply: ? you remain enrolled in our Medicare Part D prescription drug plan, ? your physician or other prescriber continues to prescribe the medication for you, and ? the medication continues to be safe for treating your condition. Depending upon the strength and/or formulation of the drug prescribed by your physician, different quantity limits or safety edits may apply in the future. If you have not already filled your prescription for this approved drug, you may do so at a participating network pharmacy

## 2023-05-20 ENCOUNTER — Telehealth (INDEPENDENT_AMBULATORY_CARE_PROVIDER_SITE_OTHER): Payer: Self-pay | Admitting: *Deleted

## 2023-05-20 NOTE — Telephone Encounter (Signed)
Call today from [facility] at [employer] assist. He needed dx for ibsrela. They took verbal diagnosis of IBS -C on the phone. And told me he was approved and he would reach out to the patient. Also received a fax today stating pt has been approved to receive isbrela from the [employer] patient assistance program through 12/17/23.

## 2023-05-27 NOTE — Telephone Encounter (Signed)
Noted  

## 2023-06-04 ENCOUNTER — Ambulatory Visit (INDEPENDENT_AMBULATORY_CARE_PROVIDER_SITE_OTHER): Payer: Medicare HMO | Admitting: Gastroenterology

## 2023-06-04 ENCOUNTER — Encounter (INDEPENDENT_AMBULATORY_CARE_PROVIDER_SITE_OTHER): Payer: Self-pay | Admitting: Gastroenterology

## 2023-06-04 ENCOUNTER — Encounter (INDEPENDENT_AMBULATORY_CARE_PROVIDER_SITE_OTHER): Payer: Self-pay

## 2023-06-04 VITALS — BP 152/76 | HR 72 | Temp 97.8°F | Ht 66.0 in | Wt 153.1 lb

## 2023-06-04 DIAGNOSIS — K582 Mixed irritable bowel syndrome: Secondary | ICD-10-CM | POA: Diagnosis not present

## 2023-06-04 DIAGNOSIS — R1031 Right lower quadrant pain: Secondary | ICD-10-CM | POA: Diagnosis not present

## 2023-06-04 DIAGNOSIS — R1032 Left lower quadrant pain: Secondary | ICD-10-CM | POA: Diagnosis not present

## 2023-06-04 NOTE — Patient Instructions (Signed)
Continue with ibsrela 50mg  twice daily  Continue Docusate, alternate 1 one night, 2 the next If you decide you would like to trial remeron for you abdominal pain, let me know As discussed, if abdominal pain worsens, you have weight loss or bleeding, please make me aware, we may consider a special scan to look at the blood flow of your intestines, however, given this has improved some with Ibsrela I suspect pain is mostly secondary to your IBS  Follow up in 6 months or sooner if you have any new or worsening symptoms  It was a pleasure to see you today! I want to create trusting relationships with patients and provide genuine, compassionate, and quality care. I truly value your feedback! please be on the lookout for a survey regarding your visit with me today. I appreciate your input about our visit and your time in completing this!    Ronnie Patrick L. Jeanmarie Hubert, MSN, APRN, AGNP-C Adult-Gerontology Nurse Practitioner [facility] at [facility]

## 2023-06-04 NOTE — Progress Notes (Addendum)
Referring Provider: Sherlyn Hay, DO Primary Care Physician:  Sherlyn Hay, DO Primary GI Physician: Levon Hedger   Chief Complaint  Patient presents with   Follow-up    Patient here today for a follow up on IBS mix. He was recently started on [facility], which patient says has been beneficial.   HPI:   Ronnie Patrick is a 72 y.o. male with past medical history of lactose intolerance, red scrotum syndrome, HTN, HLD, GERD, hx of Lyme disease, IBS   Patient presenting today for follow up of IBS-M  Last seen May 2024, at that time having some RLQ pain down low near pubic bone and then some LLQ and RLQ pain. some improvement in abdominal pain after he came off of senna and started [place] but this pain has since returned. Up until the last few days he was having less BMs and harder stools. Over the past week he has had 1-5 BMs, with some episodes being diarrhea some days. He reports that stools are watery and explosive when this occurs.   Patient has failed multiple medications for constipation, have discussed CBT, TCA therapy with him to which he declined, he did not like side effects of bentyl. Started on Ibsrela 50mg  BID at last visit.  Present:  Doing much better on Ibsrela, began on 5/22 he had been having 2-4 BMs per day on average with only 1 BM per day on days he did not take docusate. He started back on on his docusate sodium on 5/24 with just one at bedtime.  He increased docusate back to BID on 6/1 and he had more diarrhea, so he decreased back to once daily, he increased docusate again to 2 per night on 6/15 and noted diarrhea the day thereafter.  He is now feeling like he has to strain some to defecate when starting in the mornings sometimes, being just on ibsrela and one docusate at bedtime. Can have a range of harder to looser stools. He did not feel that ibsrela alone makes his stool soft enough. Drinking 70+ oz water per day. He does feel there is a strong correlation between gut  brain axis. Abdominal pain is Improved with Allena Napoleon, had resolved altogether for a short time and then recurred though not as severe. He has researched anti depressants again in hopes of finding one that may help his abdominal pain but not cause sexual side effects. He has some issues with fecal urgency at times. He notes some flatulence at times.   CT A/P with contrast 01/30/23: No acute findings in the abdomen or pelvis. Specifically, no findings to explain the patient's history of lower abdominal pain. 2. Stable 17 mm right adrenal adenoma. No followup imaging is recommended. 3. Prostatomegaly. 4.  Aortic Atherosclerosis  Last Colonoscopy:04/13/19- Two 4 to 5 mm polyps at the hepatic flexure, removed with a cold snare. Resected and retrieved.serrated lesion in hepatic flexure and a tubular adenoma. Random biopsies neg. - External and internal hemorrhoids. - MILDLY Tortuous LEFT colon. - NO OBVIOUS SOURCE FOR DIARRHEA IDENTIFIED Repeat in 5 years  Last Endoscopy:02/20/17 small hiatal hernia, gastritis-negative, benign gastric polyps:      Past Medical History:  Diagnosis Date   Basal cell carcinoma (BCC)    skin cancer on scalp-been removed   Chronic pancreatitis (HCC)    Exocrine pancreatic insufficiency    Gastritis    GERD (gastroesophageal reflux disease)    Hiatal hernia    HTN (hypertension)    Hyperlipidemia  IBS (irritable bowel syndrome)    Jock itch    LAMISIL/TERBINAFINE REQUIRED   Lyme disease JUN 2016    Past Surgical History:  Procedure Laterality Date   BIOPSY  02/20/2017   Procedure: BIOPSY;  Surgeon: West Bali, MD;  [facility];  Service: Endoscopy;;  gastric and esophageal   BIOPSY  04/13/2019   Procedure: BIOPSY;  Surgeon: West Bali, MD;  [facility];  Service: Endoscopy;;  colon   COLONOSCOPY  DEC 2015 [facility]   [facility]   COLONOSCOPY WITH PROPOFOL N/A 04/13/2019   Procedure: COLONOSCOPY WITH PROPOFOL;  Surgeon: West Bali,  MD;  [facility];  Service: Endoscopy;  Laterality: N/A;  8:30am   ESOPHAGOGASTRODUODENOSCOPY N/A 02/20/2017   Procedure: ESOPHAGOGASTRODUODENOSCOPY (EGD);  Surgeon: West Bali, MD;  [facility];  Service: Endoscopy;  Laterality: N/A;  215   EXCISIONAL HEMORRHOIDECTOMY     AGE 17-PAINF[age]   FLEXIBLE SIGMOIDOSCOPY N/A 07/15/2015   Procedure: FLEXIBLE SIGMOIDOSCOPY;  Surgeon: West Bali, MD;  [facility];  Service: Endoscopy;  Laterality: N/A;  100 - moved to 12:00 - office to notify   HEMORRHOID BANDING N/A 07/15/2015   Procedure: HEMORRHOID BANDING;  Surgeon: West Bali, MD;  [facility];  Service: Endoscopy;  Laterality: N/A;   HERNIA REPAIR Bilateral 10/2020   INGUINAL HERNIA REPAIR Right 04/24/2021   Procedure: HERNIA REPAIR INGUINAL ADULT/ RECURRENT;  Surgeon: Lucretia Roers, MD;  Location: [facility];  Service: General;  Laterality: Right;   POLYPECTOMY  04/13/2019   Procedure: POLYPECTOMY;  Surgeon: West Bali, MD;  [facility];  Service: Endoscopy;;  colon   TONSILLECTOMY AND ADENOIDECTOMY     AS A CHILD   UPPER GASTROINTESTINAL ENDOSCOPY  DEC 2015 [facility]   GERD, HH    Current Outpatient Medications  Medication Sig Dispense Refill   acetaminophen (TYLENOL) 500 MG tablet Take 500 mg by mouth 2 (two) times daily.     acyclovir (ZOVIRAX) 400 MG tablet Take 400 mg by mouth 2 (two) times daily.     atorvastatin (LIPITOR) 40 MG tablet Take 40 mg by mouth daily.     calcium carbonate (TUMS EX) 750 MG chewable tablet Chew 1 tablet by mouth 3 (three) times daily as needed for heartburn.     docusate sodium (COLACE) 100 MG capsule Take 300 mg by mouth daily.     Dupilumab (DUPIXENT) 300 MG/2ML SOPN Inject 300 mg every 2 weeks by subcutaneous route.     fexofenadine (ALLEGRA) 180 MG tablet Take 180 mg by mouth daily.     fluticasone (FLONASE) 50 MCG/ACT nasal spray Place 1 spray into both nostrils 2 (two) times daily at 10  AM and 5 PM.     levocetirizine (XYZAL) 5 MG tablet Take 5 mg by mouth every evening.     metoprolol tartrate (LOPRESSOR) 25 MG tablet Take 25 mg by mouth 2 (two) times daily. Hold evening dose if SBP <120     Multiple Vitamin (MULTIVITAMIN WITH MINERALS) TABS tablet Take 1 tablet by mouth daily.     naproxen sodium (ALEVE) 220 MG tablet Take 440 mg by mouth daily as needed. 500mg  daily and 220mg  later in the day     pantoprazole (PROTONIX) 40 MG tablet Take 1 tablet (40 mg total) by mouth daily. 90 tablet 3   polyvinyl alcohol (LIQUIFILM TEARS) 1.4 % ophthalmic solution Place 1 drop into both eyes every  4 (four) hours as needed for dry eyes.     sildenafil (VIAGRA) 50 MG tablet Take 1 tablet (50 mg total) by mouth daily as needed for erectile dysfunction. 30 tablet 5   sodium chloride (OCEAN) 0.65 % SOLN nasal spray Place 1 spray into both nostrils as needed for congestion.     tadalafil (CIALIS) 5 MG tablet Take 1 tablet (5 mg total) by mouth daily. 90 tablet 3   Tenapanor HCl (IBSRELA) 50 MG TABS Take 50 mg by mouth 2 (two) times daily. 180 tablet 3   VENTOLIN HFA 108 (90 BASE) MCG/ACT inhaler Inhale 2 puffs into the lungs every 4 (four) hours as needed for wheezing or shortness of breath.      No current facility-administered medications for this visit.    Allergies as of 06/04/2023 - Review Complete 06/04/2023  Allergen Reaction Noted   Bacitracin Hives 02/28/2017   Flagyl [metronidazole] Itching 06/29/2015   Neosporin original [bacitracin-neomycin-polymyxin]  11/20/2021   Neosporin [neomycin-bacitracin zn-polymyx] Hives 02/28/2017   Penicillins Itching 06/29/2015    Family History  Problem Relation Age of Onset   Allergic rhinitis Mother    Eczema Mother    Urticaria Mother    Asthma Father    Colon cancer Neg Hx    Colon polyps Neg Hx     Social History   Socioeconomic History   Marital status: Divorced    Spouse name: Not on file   Number of children: Not on file    Years of education: Not on file   Highest education level: Not on file  Occupational History   Not on file  Tobacco Use   Smoking status: Former    Packs/day: 1.00    Years: 34.00    Additional pack years: 0.00    Total pack years: 34.00    Types: Cigarettes    Quit date: 07/14/2001    Years since quitting: 21.9    Passive exposure: Past   Smokeless tobacco: Never   Tobacco comments:    Quit in 2002  Vaping Use   Vaping Use: Never used  Substance and Sexual Activity   Alcohol use: No    Alcohol/week: 0.0 standard drinks of alcohol   Drug use: No   Sexual activity: Yes  Other Topics Concern   Not on file  Social History Narrative   ORIGINALLY FROM [place]. USED TO LOVE TO VACATION IN [place]: [place], [place].    JOB: RESEARCH TECHNICIAN, DEGREE IN COMPUTER SCIENCE, SOLD INSURANCE.      2 SONS:  THEY LIVE IN [place] WITH EX-WIFE.   SEXUAL ORIENTATION: ATTRACTED TO MEN.   RARE ETOH OR SWEETS. HAS A TWIN BROTHER WITH DIABETES.   Social Determinants of Health   Financial Resource Strain: Not on file  Food Insecurity: Not on file  Transportation Needs: Not on file  Physical Activity: Not on file  Stress: Not on file  Social Connections: Not on file   Review of systems General: negative for malaise, night sweats, fever, chills, weight loss Neck: Negative for lumps, goiter, pain and significant neck swelling Resp: Negative for cough, wheezing, dyspnea at rest CV: Negative for chest pain, leg swelling, palpitations, orthopnea GI: denies melena, hematochezia, nausea, vomiting, diarrhea, constipation, dysphagia, odyonophagia, early satiety or unintentional weight loss.  MSK: Negative for joint pain or swelling, back pain, and muscle pain. Derm: Negative for itching or rash Psych: Denies depression, anxiety, memory loss, confusion. No homicidal or suicidal ideation.  Heme: Negative for prolonged bleeding, bruising  easily, and swollen nodes. Endocrine: Negative for cold or heat  intolerance, polyuria, polydipsia and goiter. Neuro: negative for tremor, gait imbalance, syncope and seizures. The remainder of the review of systems is noncontributory.  Physical Exam: BP (!) 152/76 (BP Location: Left Arm, Patient Position: Sitting, Cuff Size: Large)   Pulse 72   Temp 97.8 F (36.6 C) (Temporal)   Ht 5\' 6"  (1.676 m)   Wt 153 lb 1.6 oz (69.4 kg)   BMI 24.71 kg/m  General:   Alert and oriented. No distress noted. Pleasant and cooperative.  Head:  Normocephalic and atraumatic. Eyes:  Conjuctiva clear without scleral icterus. Mouth:  Oral mucosa pink and moist. Good dentition. No lesions. Heart: Normal rate and rhythm, s1 and s2 heart sounds present.  Lungs: Clear lung sounds in all lobes. Respirations equal and unlabored. Abdomen:  +BS, soft,  and non-distended. Mild TTP of RLQ/LLQ. No rebound or guarding. No HSM or masses noted. No obvious hernias present on exam.  Derm: No palmar erythema or jaundice Msk:  Symmetrical without gross deformities. Normal posture. Extremities:  Without edema. Neurologic:  Alert and  oriented x4 Psych:  Alert and cooperative. Normal mood and affect.  Invalid input(s): "6 MONTHS"   ASSESSMENT: Parmod Legore is a 72 y.o. male presenting today for follow up of IBS-M.  Doing much better on Ibsrela, he has had improvement in his bowel function and his abdominal pain though continues to have some abdominal pain, usually RLQ and LLQ after eating. He has no rectal bleeding or weight loss and pain has improved with the use of Ibsrela, he is still taking docusate as he does not feel that stools are soft enough on Ibsrela alone, has had some difficult regulating his dose of docusate to avoid diarrhea. Recommend continuing Ibsrela 50mg  BID, can alternate docusate taking 1 one night and 2 the next night. Should continue with good water intake. As he has no weight loss or rectal bleeding, and abdominal pain has improved with Ibsrela, I suspect abdominal  pain is secondary to his IBS, however, if pain worsens, he develops weight loss or rectal bleeding, may consider CT Angio to rule out any ischemic changes.   As we have discussed TCA therapy in the past to help control his GI symptoms, He did inquire again about anti depressants for the use of his abdominal pain though is concerned about potential sexual side effects, he notes that in his research wellbutrin and remeron tend to have the least sexual side effects, we discussed that remeron is a TCA which are typically the class medications utilized for functional abdominal pain, whereas we do not utilize wellbutrin for this. He is open to possibly considering a trial of remeron in the future but for now would like to see how he does with continued use of Ibsrela which I think is reasonable.    PLAN:  Continue with ibsrela 50mg  BID  2. Continue with good water intake  3. Docusate, alternate 1 one night, 2 the next 4. Consider Remeron for abdominal pain  5. Consider CT Angio A/P if pain worsens, associated weight loss, rectal bleeding/other alarm symptoms   All questions were answered, patient verbalized understanding and is in agreement with plan as outlined above.   Follow Up: 6 months   Zian Delair L. Jeanmarie Hubert, MSN, APRN, AGNP-C Adult-Gerontology Nurse Practitioner [facility]  I have reviewed the note and agree with the APP's assessment as described in this progress note  Katrinka Blazing, MD  Gastroenterology and [facility]

## 2023-07-08 ENCOUNTER — Other Ambulatory Visit (INDEPENDENT_AMBULATORY_CARE_PROVIDER_SITE_OTHER): Payer: Self-pay | Admitting: Gastroenterology

## 2023-07-08 DIAGNOSIS — R1030 Lower abdominal pain, unspecified: Secondary | ICD-10-CM

## 2023-07-08 DIAGNOSIS — R1032 Left lower quadrant pain: Secondary | ICD-10-CM

## 2023-07-08 DIAGNOSIS — R1031 Right lower quadrant pain: Secondary | ICD-10-CM

## 2023-07-08 MED ORDER — MIRTAZAPINE 7.5 MG PO TABS: 7.5000 mg | ORAL_TABLET | Freq: Every day | ORAL | 1 refills | Status: DC

## 2023-07-10 ENCOUNTER — Telehealth (INDEPENDENT_AMBULATORY_CARE_PROVIDER_SITE_OTHER): Payer: Self-pay | Admitting: Gastroenterology

## 2023-07-10 NOTE — Telephone Encounter (Signed)
Can we schedule CT Angio A/P with contrast for Ronnie Patrick, diagnosis is abdominal pain, worsened post prandially, rule out mesenteric ischemia. Thanks!   Approval via [facility]olph[facility] Sevin number: [id number] Valid from 07/10/23-01/06/24  My chart message sent to patient with time and instructions. Scheduled for 08/09/23 at 9:30 am. Arrive at 9:15 am. Liquids only 4 hours prior to exam.

## 2023-07-17 ENCOUNTER — Ambulatory Visit (HOSPITAL_COMMUNITY): Payer: Medicare HMO

## 2023-07-22 ENCOUNTER — Ambulatory Visit (INDEPENDENT_AMBULATORY_CARE_PROVIDER_SITE_OTHER): Payer: Medicare HMO | Admitting: Gastroenterology

## 2023-08-09 ENCOUNTER — Other Ambulatory Visit (HOSPITAL_COMMUNITY): Payer: Medicare HMO

## 2023-08-09 ENCOUNTER — Other Ambulatory Visit: Payer: Self-pay | Admitting: Medical Genetics

## 2023-08-09 DIAGNOSIS — Z006 Encounter for examination for normal comparison and control in clinical research program: Secondary | ICD-10-CM

## 2023-08-15 ENCOUNTER — Ambulatory Visit (INDEPENDENT_AMBULATORY_CARE_PROVIDER_SITE_OTHER): Payer: Medicare HMO | Admitting: Gastroenterology

## 2023-08-16 ENCOUNTER — Ambulatory Visit (HOSPITAL_COMMUNITY)
Admission: RE | Admit: 2023-08-16 | Discharge: 2023-08-16 | Disposition: A | Discharge status: Home / Self Care | Payer: Medicare HMO | Source: Ambulatory Visit | Attending: Gastroenterology | Admitting: Gastroenterology

## 2023-08-16 ENCOUNTER — Other Ambulatory Visit (HOSPITAL_COMMUNITY)
Admission: RE | Admit: 2023-08-16 | Discharge: 2023-08-16 | Disposition: A | Discharge status: Home / Self Care | Payer: Medicare HMO | Source: Ambulatory Visit | Attending: Oncology | Admitting: Oncology

## 2023-08-16 DIAGNOSIS — R1030 Lower abdominal pain, unspecified: Secondary | ICD-10-CM

## 2023-08-16 DIAGNOSIS — Z006 Encounter for examination for normal comparison and control in clinical research program: Secondary | ICD-10-CM | POA: Insufficient documentation

## 2023-08-16 DIAGNOSIS — R1031 Right lower quadrant pain: Secondary | ICD-10-CM

## 2023-08-16 DIAGNOSIS — I700 Atherosclerosis of aorta: Secondary | ICD-10-CM | POA: Diagnosis not present

## 2023-08-16 DIAGNOSIS — R1032 Left lower quadrant pain: Secondary | ICD-10-CM

## 2023-08-16 DIAGNOSIS — Z9889 Other specified postprocedural states: Secondary | ICD-10-CM | POA: Diagnosis not present

## 2023-08-16 MED ORDER — IOHEXOL 350 MG/ML SOLN
100.0000 mL | Freq: Once | INTRAVENOUS | Status: AC | PRN
  Administered 2023-08-16: 100 mL via INTRAVENOUS

## 2023-08-31 LAB — GENECONNECT MOLECULAR SCREEN: Genetic Analysis Overall Interpretation: NEGATIVE

## 2023-09-26 ENCOUNTER — Encounter: Payer: Self-pay | Admitting: General Surgery

## 2023-09-26 ENCOUNTER — Ambulatory Visit: Payer: Medicare HMO | Admitting: General Surgery

## 2023-09-26 VITALS — BP 153/89 | HR 64 | Temp 98.1°F | Resp 12 | Ht 66.0 in | Wt 153.0 lb

## 2023-09-26 DIAGNOSIS — R1032 Left lower quadrant pain: Secondary | ICD-10-CM | POA: Diagnosis not present

## 2023-09-26 DIAGNOSIS — R1031 Right lower quadrant pain: Secondary | ICD-10-CM

## 2023-09-26 NOTE — Patient Instructions (Signed)
Continue on your new medication that is helping with pain. If issues, I can contact Interventional Radiology about injections in the nerves for the chronic pain.

## 2023-09-27 NOTE — Progress Notes (Signed)
[facility] Note   HPI:  72 y.o. Male presents to clinic for follow-up evaluation. The patient is well known to me after a right open inguinal hernia repair.  I completed this in May of 2022. This was complicated by a hematoma but this resolved after a few months. He has continued to have lower abdominal pain.  Prior to my repair he had a bilateral laparoscopic inguinal hernia repair with mesh in [place] and at that procedure metal tacks were used.   He is here today as he has this tacks and these were noted on his most recent CT scan. GI obtained the CTA to ensure he did not have any chronic mesenteric ischemia with his lower abdominal pain.  He says that in the last few months he actually started on an antidepressant and his lower abdominal pain is better.   He still wanted to come and discuss his CT and get my opinion.   Review of Systems:  Lower abdominal pain left and right side  All other review of systems: otherwise negative   Vital Signs:  BP (!) 153/89   Pulse 64   Temp 98.1 F (36.7 C) (Oral)   Resp 12   Ht 5\' 6"  (1.676 m)   Wt 153 lb (69.4 kg)   SpO2 96%   BMI 24.69 kg/m    Physical Exam:  Physical Exam Vitals reviewed.  HENT:     Head: Normocephalic.  Eyes:     Pupils: Pupils are equal, round, and reactive to light.  Cardiovascular:     Rate and Rhythm: Normal rate.  Pulmonary:     Effort: Pulmonary effort is normal.  Abdominal:     General: There is no distension.     Palpations: Abdomen is soft.     Tenderness: There is no abdominal tenderness.     Hernia: No hernia is present.  Skin:    General: Skin is warm.  Neurological:     General: No focal deficit present.     Imaging:  Personally reviewed and showed patient most recent CT and prior Cts- tacks in place, some have likely migrated CLINICAL DATA:  Post prandial bilateral lower quadrant abdominal pain. Chronic diarrhea. History of irritable bowel syndrome, pancreatitis and  hernia repair.   EXAM: CTA ABDOMEN AND PELVIS WITHOUT AND WITH CONTRAST   TECHNIQUE: Multidetector CT imaging of the abdomen and pelvis was performed using the standard protocol during bolus administration of intravenous contrast. Multiplanar reconstructed images and MIPs were obtained and reviewed to evaluate the vascular anatomy.   RADIATION DOSE REDUCTION: This exam was performed according to the departmental dose-optimization program which includes automated exposure control, adjustment of the mA and/or kV according to patient size and/or use of iterative reconstruction technique.   CONTRAST:  OMNIPAQUE IOHEXOL 350 MG/ML SOLN   COMPARISON:  Prior CT scan of the abdomen and pelvis 01/30/2023   FINDINGS: VASCULAR   Aorta: Normal caliber aorta without aneurysm, dissection, vasculitis or significant stenosis.   Celiac: Patent without evidence of aneurysm, dissection, vasculitis or significant stenosis.   SMA: Patent without evidence of aneurysm, dissection, vasculitis or significant stenosis.   Renals: Multiple renal arteries bilaterally. On the right, there are 2 renal arteries. The accessory artery to the lower pole of the right kidney arises from the anterior aorta. On the left, there are 3 renal arteries. No significant stenosis, dissection, aneurysm or changes of fibromuscular dysplasia.   IMA: Patent without evidence of aneurysm, dissection, vasculitis or significant  stenosis.   Inflow: Tortuous iliac systems with diffuse atherosclerotic plaque bilaterally. 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 The maximal diameter of the vessel is 1.6 cm. 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   Proximal Outflow: Bilateral common femoral and visualized portions of the superficial and profunda femoral arteries are patent without evidence of aneurysm,  dissection, vasculitis or significant stenosis.   Veins: No focal venous abnormality.   Review of the MIP images confirms the above findings.   NON-VASCULAR   Lower chest: No acute abnormality.   Hepatobiliary: No focal liver abnormality is seen. No gallstones, gallbladder wall thickening, or biliary dilatation.   Pancreas: Punctate calcifications throughout the pancreatic head and uncinate process consistent with sequelae of prior pancreatitis. No pancreatic mass or evidence of active inflammation.   Spleen: Normal in size without focal abnormality.   Adrenals/Urinary Tract: Stable 1.8 cm right adrenal nodule. Stability over time is most consistent with a benign adenoma. The left adrenal gland is normal. No hydronephrosis, nephrolithiasis or enhancing renal mass. Stable simple renal cysts. No imaging follow-up is recommended. The ureters and bladder are unremarkable.   Stomach/Bowel: No focal bowel wall thickening or evidence of obstruction.   Lymphatic: No suspicious lymphadenopathy.   Reproductive: Prostate gland is unremarkable.   Other: Evidence of prior laparoscopic hernia repair. No recurrent hernia.   Musculoskeletal: No acute fracture or malalignment. Similar appearance of significant focal degenerative disc disease at L1-L2 and L2-L3 compared to prior imaging. Findings suggest the possibility of prior  osteomyelitis/discitis. Unchanged grade 1 anterolisthesis of L3 on L4 and L4 on L5.   IMPRESSION: VASCULAR   1. No evidence of significant stenosis or occlusion of the visceral arteries to suggest a source for chronic mesenteric ischemia. 2. 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 Maximal diameter is 1.6 cm. 3. 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 4. Diffuse atherosclerotic calcifications throughout the abdominal aorta without aneurysm,  dissection or stenosis. Aortic Atherosclerosis (ICD10-I70.0). 5. Multiple renal arteries bilaterally.   NON-VASCULAR   1. No acute abnormality within the abdomen or pelvis. 2. Sequelae of chronic pancreatitis. 3. Stable right adrenal nodule, almost certainly a benign adenoma. 4. Similar appearance of the spine with advanced degenerative disc disease at L1-L2 and L2-L3. Findings suggest the possibility of prior osteomyelitis/discitis. 5. Unchanged grade 1 anterolisthesis of L3 on L4 and L4 on L5. 6. Evidence of prior bilateral laparoscopic inguinal hernia repairs. While there is no evidence for recurrent hernia, helical tacks are  present within the lower abdominal wall in both lower quadrants consistent with the reported site of the patient's discomfort.     Electronically Signed   By: Malachy Moan M.D.   On: 08/17/2023 06:52   Assessment:  72 y.o. yo Male with lower abdominal pain and tacks in his abdomen. Discussed with him that these were placed by the surgeon in [place]. Discussed that these could in fact cause pain. Discussed that I would not remove those tacks and no one else should as this will likely cause more pain and issues.  Discussed that IR  can at times do nerve blocks and can reduce chronic pain. If we need to we can eventually contact them about this option.   Plan:  Patient feeling better He wants to wait on any IR place  If he has worsening issues he will call for me to get in touch with IR   All questions answered.  Future Appointments  Date Time Provider Department Center  11/26/2023  1:45 PM Raquel Derin, NP [facility] None  12/02/2023  1:15 PM Jerilee Field, MD [facility] None     Algis Greenhouse, MD [facility] [address] [place], [place] [address] [phone] (office)

## 2023-09-29 ENCOUNTER — Encounter (INDEPENDENT_AMBULATORY_CARE_PROVIDER_SITE_OTHER): Payer: Self-pay

## 2023-10-03 ENCOUNTER — Other Ambulatory Visit (INDEPENDENT_AMBULATORY_CARE_PROVIDER_SITE_OTHER): Payer: Self-pay | Admitting: Gastroenterology

## 2023-10-03 MED ORDER — MIRTAZAPINE 15 MG PO TABS: 15.0000 mg | ORAL_TABLET | Freq: Every day | ORAL | 3 refills | Status: DC

## 2023-10-21 ENCOUNTER — Other Ambulatory Visit (INDEPENDENT_AMBULATORY_CARE_PROVIDER_SITE_OTHER): Payer: Self-pay | Admitting: Gastroenterology

## 2023-10-21 MED ORDER — MAHANA IBS MISC: 0 refills | Status: DC

## 2023-10-27 ENCOUNTER — Encounter (HOSPITAL_COMMUNITY): Payer: Self-pay

## 2023-10-27 ENCOUNTER — Emergency Department (HOSPITAL_COMMUNITY): Payer: Medicare HMO

## 2023-10-27 ENCOUNTER — Other Ambulatory Visit: Payer: Self-pay

## 2023-10-27 ENCOUNTER — Emergency Department (HOSPITAL_COMMUNITY)
Admission: EM | Admit: 2023-10-27 | Discharge: 2023-10-27 | Disposition: A | Discharge status: Home / Self Care | Payer: Medicare HMO | Attending: Emergency Medicine | Admitting: Emergency Medicine

## 2023-10-27 DIAGNOSIS — I10 Essential (primary) hypertension: Secondary | ICD-10-CM | POA: Insufficient documentation

## 2023-10-27 DIAGNOSIS — Z79899 Other long term (current) drug therapy: Secondary | ICD-10-CM | POA: Diagnosis not present

## 2023-10-27 DIAGNOSIS — M7532 Calcific tendinitis of left shoulder: Secondary | ICD-10-CM | POA: Insufficient documentation

## 2023-10-27 DIAGNOSIS — M25512 Pain in left shoulder: Secondary | ICD-10-CM | POA: Diagnosis present

## 2023-10-27 MED ORDER — METHOCARBAMOL 500 MG PO TABS: 500.0000 mg | ORAL_TABLET | Freq: Two times a day (BID) | ORAL | 0 refills | Status: DC

## 2023-10-27 NOTE — ED Provider Notes (Signed)
[place] EMERGENCY DEPARTMENT AT [facility] Provider Note   CSN: [id number] Arrival date & time: 10/27/23  1457     History  Chief Complaint  Patient presents with   Shoulder Pain    Ronnie Patrick is a 72 y.o. male with hypertension, OA presents with complaints of left shoulder pain that started abruptly about a week ago.  He denies any known injury or trauma.  Denies any loss of range of motion.  He is concerned because he has a strong family history of cardiac issues, including his father with multiple heart attacks.  The patient himself denies any chest pain, shortness of breath, near-syncope, nausea, vomiting.  There is no exertional component.  Does report pain appears to be worse with moving his left upper extremity.  States he takes naproxen and Tylenol level multiple times a day for years now.  Reports that he will occasionally have intermittent numbness in his fingers. He thinks this is new.    Shoulder Pain      Home Medications Prior to Admission medications   Medication Sig Start Date End Date Taking? Authorizing Provider  methocarbamol (ROBAXIN) 500 MG tablet Take 1 tablet (500 mg total) by mouth 2 (two) times daily. 10/27/23  Yes Halford Decamp, PA-C  acetaminophen (TYLENOL) 500 MG tablet Take 500 mg by mouth 2 (two) times daily.    [provider]  acyclovir (ZOVIRAX) 400 MG tablet Take 400 mg by mouth 2 (two) times daily.    [provider]  atorvastatin (LIPITOR) 40 MG tablet Take 40 mg by mouth daily. 02/07/21   [provider]  calcium carbonate (TUMS EX) 750 MG chewable tablet Chew 1 tablet by mouth 3 (three) times daily as needed for heartburn.    [provider]  docusate sodium (COLACE) 100 MG capsule Take 300 mg by mouth daily.    [provider]  DTx App - [facility] IBS) MISC Please download mahana IBS APP and expect a text message from [facility] to enroll in subscription 10/21/23    Carlan, Chelsea L, NP  Dupilumab (DUPIXENT) 300 MG/2ML SOPN Inject 300 mg every 2 weeks by subcutaneous route. 06/07/22   [provider]  fexofenadine (ALLEGRA) 180 MG tablet Take 180 mg by mouth daily.    [provider]  fluticasone (FLONASE) 50 MCG/ACT nasal spray Place 1 spray into both nostrils 2 (two) times daily at 10 AM and 5 PM.    [provider]  levocetirizine (XYZAL) 5 MG tablet Take 5 mg by mouth every evening.    [provider]  metoprolol tartrate (LOPRESSOR) 25 MG tablet Take 25 mg by mouth 2 (two) times daily. Hold evening dose if SBP <120 03/15/19   [provider]  mirtazapine (REMERON) 15 MG tablet Take 1 tablet (15 mg total) by mouth at bedtime. 10/03/23 10/02/24  Raquel Yui Mulvaney, NP  Multiple Vitamin (MULTIVITAMIN WITH MINERALS) TABS tablet Take 1 tablet by mouth daily.    [provider]  naproxen sodium (ALEVE) 220 MG tablet Take 440 mg by mouth daily as needed. 500mg  daily and 220mg  later in the day    [provider]  pantoprazole (PROTONIX) 40 MG tablet Take 1 tablet (40 mg total) by mouth daily. 01/21/23   Carlan, Chelsea L, NP  polyvinyl alcohol (LIQUIFILM TEARS) 1.4 % ophthalmic solution Place 1 drop into both eyes every 4 (four) hours as needed for dry eyes.    [provider]  sildenafil (VIAGRA)  50 MG tablet Take 1 tablet (50 mg total) by mouth daily as needed for erectile dysfunction. 12/03/22   Jerilee Field, MD  sodium chloride (OCEAN) 0.65 % SOLN nasal spray Place 1 spray into both nostrils as needed for congestion.    [provider]  tadalafil (CIALIS) 5 MG tablet Take 1 tablet (5 mg total) by mouth daily. 03/13/23   Jerilee Field, MD  Tenapanor HCl (IBSRELA) 50 MG TABS Take 50 mg by mouth 2 (two) times daily. 05/14/23   Carlan, Chelsea L, NP  VENTOLIN HFA 108 (90 BASE) MCG/ACT inhaler Inhale 2 puffs into the lungs every 4 (four) hours as needed for wheezing or shortness of  breath.  04/28/15   [provider]      Allergies    Bacitracin, Flagyl [metronidazole], Keflex [cephalexin], Neosporin original [bacitracin-neomycin-polymyxin], Neosporin [neomycin-bacitracin zn-polymyx], Penicillins, and Sulfa antibiotics    Review of Systems   Review of Systems  Musculoskeletal:        Left shoulder pain    Physical Exam Updated Vital Signs BP 138/78   Pulse 68   Temp 97.6 F (36.4 C) (Oral)   Resp 16   Ht 5\' 6"  (1.676 m)   Wt 61.2 kg   SpO2 96%   BMI 21.79 kg/m  Physical Exam Vitals and nursing note reviewed.  Constitutional:      General: He is not in acute distress.    Appearance: He is well-developed.  HENT:     Head: Normocephalic and atraumatic.  Eyes:     Conjunctiva/sclera: Conjunctivae normal.  Cardiovascular:     Rate and Rhythm: Normal rate and regular rhythm.     Heart sounds: No murmur heard. Pulmonary:     Effort: Pulmonary effort is normal. No respiratory distress.     Breath sounds: Normal breath sounds.  Abdominal:     Palpations: Abdomen is soft.     Tenderness: There is no abdominal tenderness.  Musculoskeletal:        General: No swelling.     Cervical back: Neck supple.     Comments: Left shoulder: Tender along left upper trap, lateral shoulder, axilla.  Demonstrates full range of motion, 5 out of 5 strength.  No swelling, erythema, or lesions in the shoulder region. +Neers, +Hawkins  Skin:    General: Skin is warm and dry.     Capillary Refill: Capillary refill takes less than 2 seconds.  Neurological:     Mental Status: He is alert.  Psychiatric:        Mood and Affect: Mood normal.     ED Results / Procedures / Treatments   Labs (all labs ordered are listed, but only abnormal results are displayed) Labs Reviewed - No data to display  EKG EKG Interpretation Date/Time:  Sunday October 27 2023 16:07:45 EST Ventricular Rate:  68 PR Interval:  163 QRS Duration:  102 QT Interval:  385 QTC  Calculation: 410 R Axis:   68  Text Interpretation: Sinus rhythm since last tracing no significant change Normal ECG Confirmed by Eber Hong ([id number]) on 10/27/2023 4:31:38 PM  Radiology DG Shoulder Left  Result Date: 10/27/2023 CLINICAL DATA:  Left shoulder pain. EXAM: LEFT SHOULDER - 2+ VIEW COMPARISON:  None Available. FINDINGS: There is no evidence of fracture or dislocation. Amorphous mineralization adjacent to the greater tuberosity, in the region of the supraspinatus tendon rotator cuff insertion. Cortical irregularity of the greater tuberosity, suggestive of chronic rotator cuff pathology. The glenohumeral joint is anatomically aligned with  mild joint space narrowing. The acromioclavicular joint is anatomically aligned with degenerative changes and chondrocalcinosis. IMPRESSION: 1. No acute fracture or dislocation of the left shoulder. 2. Findings suggestive of calcific tendinosis of the rotator cuff, in the region of the supraspinatus tendon cuff insertion. 3. Degenerative changes of the left shoulder and acromioclavicular joints. Electronically Signed   By: Hart Robinsons M.D.   On: 10/27/2023 16:21    Procedures Procedures    Medications Ordered in ED Medications - No data to display  ED Course/ Medical Decision Making/ A&P                                 Medical Decision Making Amount and/or Complexity of Data Reviewed Radiology: ordered.     Patient presents to the ED for concern of left shoulder pain, this involves an extensive number of treatment options, and is a complaint that carries with it a high risk of complications and morbidity.  The differential diagnosis includes ACS, PE, AAA, stroke, rotator cuff pathology, arthritis, adhesive capsulitis.   Co morbidities that complicate the patient evaluation  Hypertension   Additional history obtained:  Additional history obtained from none    External records from outside source obtained and reviewed including  none   Lab Tests:  None indicated   Imaging Studies ordered:  I ordered imaging studies including order x-ray I independently visualized and interpreted imaging which showed some supraspinatus calcifications suggestive of calcific tendinosis I agree with the radiologist interpretation   Cardiac Monitoring:  EKG performed in light of strong cardiac family history: Sinus rhythm without ischemic changes  Medicines ordered and prescription drug management:  No medications indicated during visit   Test Considered:  Further cardiac workup CTA.  Deferred as patient is hemodynamically stable, PERC negative, with reproducible left shoulder pain, worse with movement and clinical exam consistent with tendinopathy.  History with no cardiac features.   Critical Interventions:  None   Consultations Obtained:  None  Problem List / ED Course:  EKG reassuring against cardiac component.  Exam is consistent with musculoskeletal etiology.  Left shoulder x-ray supports this with calcifications along supraspinatus tendon, some a AC joint as well.   Reevaluation:  After the interventions noted above, I reevaluated the patient and found that they have :stayed the same   Social Determinants of Health:  None   Dispostion:  After consideration of the diagnostic results and the patients response to treatment, I feel that the patent would benefit from discharge home with close follow-up with PCP and Ortho.  Provided short course of muscle relaxants.  Stressed importance of not driving or operating heavy machinery while while taking muscle relaxants.  Patient realizes understanding.         Final Clinical Impression(s) / ED Diagnoses Final diagnoses:  Calcific tendinitis of left shoulder region    Rx / DC Orders ED Discharge Orders          Ordered    methocarbamol (ROBAXIN) 500 MG tablet  2 times daily        10/27/23 1719              Dianna, Brehm 10/27/23 1801    Eber Hong, MD 10/28/23 1023

## 2023-10-27 NOTE — Discharge Instructions (Addendum)
It was a pleasure taking care of you this afternoon.  Your evaluated in the emergency department for a left shoulder axillary pain.  An x-ray was taken of your shoulder that showed some calcifications in your rotator cuff.  This is likely the cause of your symptoms.  An EKG was performed as well to evaluate your heart. This was unremarkable.  I am prescribing you a muscle relaxer.  Please use this as needed for your symptoms.  Please keep the appointment tomorrow with your PCP to further your care.  If you experience any new or worsening symptoms including chest pain, shortness of breath, dizziness please return to the emergency department for further evaluation.

## 2023-10-27 NOTE — ED Triage Notes (Signed)
Pain in LEFT shoulder and arm and LEFT part of upper back x1 week  Pt denies falls  Pt stated that 2 weeks ago he helped someone move a stove   Pt is pain free after he wakes up in the morning

## 2023-10-30 DIAGNOSIS — R7303 Prediabetes: Secondary | ICD-10-CM | POA: Insufficient documentation

## 2023-10-30 DIAGNOSIS — K861 Other chronic pancreatitis: Secondary | ICD-10-CM | POA: Insufficient documentation

## 2023-10-30 DIAGNOSIS — N401 Enlarged prostate with lower urinary tract symptoms: Secondary | ICD-10-CM | POA: Insufficient documentation

## 2023-10-30 DIAGNOSIS — K2100 Gastro-esophageal reflux disease with esophagitis, without bleeding: Secondary | ICD-10-CM | POA: Insufficient documentation

## 2023-10-30 DIAGNOSIS — R1013 Epigastric pain: Secondary | ICD-10-CM | POA: Insufficient documentation

## 2023-10-30 DIAGNOSIS — E278 Other specified disorders of adrenal gland: Secondary | ICD-10-CM | POA: Insufficient documentation

## 2023-10-30 DIAGNOSIS — K5901 Slow transit constipation: Secondary | ICD-10-CM | POA: Insufficient documentation

## 2023-10-30 DIAGNOSIS — F39 Unspecified mood [affective] disorder: Secondary | ICD-10-CM | POA: Insufficient documentation

## 2023-10-30 DIAGNOSIS — N529 Male erectile dysfunction, unspecified: Secondary | ICD-10-CM | POA: Insufficient documentation

## 2023-10-30 DIAGNOSIS — K8689 Other specified diseases of pancreas: Secondary | ICD-10-CM | POA: Insufficient documentation

## 2023-10-30 DIAGNOSIS — R079 Chest pain, unspecified: Secondary | ICD-10-CM | POA: Insufficient documentation

## 2023-10-30 DIAGNOSIS — A539 Syphilis, unspecified: Secondary | ICD-10-CM | POA: Insufficient documentation

## 2023-10-30 DIAGNOSIS — M479 Spondylosis, unspecified: Secondary | ICD-10-CM | POA: Insufficient documentation

## 2023-10-30 DIAGNOSIS — W57XXXA Bitten or stung by nonvenomous insect and other nonvenomous arthropods, initial encounter: Secondary | ICD-10-CM | POA: Insufficient documentation

## 2023-10-30 DIAGNOSIS — L853 Xerosis cutis: Secondary | ICD-10-CM | POA: Insufficient documentation

## 2023-10-30 DIAGNOSIS — M0230 Reiter's disease, unspecified site: Secondary | ICD-10-CM | POA: Insufficient documentation

## 2023-10-30 DIAGNOSIS — E785 Hyperlipidemia, unspecified: Secondary | ICD-10-CM | POA: Insufficient documentation

## 2023-10-30 DIAGNOSIS — K5909 Other constipation: Secondary | ICD-10-CM | POA: Insufficient documentation

## 2023-10-30 DIAGNOSIS — H04123 Dry eye syndrome of bilateral lacrimal glands: Secondary | ICD-10-CM | POA: Insufficient documentation

## 2023-10-30 DIAGNOSIS — I10 Essential (primary) hypertension: Secondary | ICD-10-CM | POA: Insufficient documentation

## 2023-10-30 DIAGNOSIS — B009 Herpesviral infection, unspecified: Secondary | ICD-10-CM | POA: Insufficient documentation

## 2023-10-30 DIAGNOSIS — I7300 Raynaud's syndrome without gangrene: Secondary | ICD-10-CM | POA: Insufficient documentation

## 2023-10-30 DIAGNOSIS — F524 Premature ejaculation: Secondary | ICD-10-CM | POA: Insufficient documentation

## 2023-10-30 DIAGNOSIS — Z7252 High risk homosexual behavior: Secondary | ICD-10-CM | POA: Insufficient documentation

## 2023-10-30 DIAGNOSIS — H00019 Hordeolum externum unspecified eye, unspecified eyelid: Secondary | ICD-10-CM | POA: Insufficient documentation

## 2023-10-30 DIAGNOSIS — J309 Allergic rhinitis, unspecified: Secondary | ICD-10-CM | POA: Insufficient documentation

## 2023-10-30 DIAGNOSIS — T2100XA Burn of unspecified degree of trunk, unspecified site, initial encounter: Secondary | ICD-10-CM | POA: Insufficient documentation

## 2023-10-30 DIAGNOSIS — E876 Hypokalemia: Secondary | ICD-10-CM | POA: Insufficient documentation

## 2023-10-30 DIAGNOSIS — M778 Other enthesopathies, not elsewhere classified: Secondary | ICD-10-CM | POA: Insufficient documentation

## 2023-10-30 DIAGNOSIS — E8881 Metabolic syndrome: Secondary | ICD-10-CM | POA: Insufficient documentation

## 2023-10-30 DIAGNOSIS — J45909 Unspecified asthma, uncomplicated: Secondary | ICD-10-CM | POA: Insufficient documentation

## 2023-11-01 ENCOUNTER — Ambulatory Visit: Payer: Medicare HMO | Admitting: Orthopedic Surgery

## 2023-11-01 ENCOUNTER — Other Ambulatory Visit (INDEPENDENT_AMBULATORY_CARE_PROVIDER_SITE_OTHER): Payer: Medicare HMO

## 2023-11-01 DIAGNOSIS — M5030 Other cervical disc degeneration, unspecified cervical region: Secondary | ICD-10-CM

## 2023-11-01 DIAGNOSIS — M792 Neuralgia and neuritis, unspecified: Secondary | ICD-10-CM | POA: Diagnosis not present

## 2023-11-01 DIAGNOSIS — M65331 Trigger finger, right middle finger: Secondary | ICD-10-CM | POA: Diagnosis not present

## 2023-11-01 MED ORDER — PREDNISONE 10 MG (48) PO TBPK: ORAL_TABLET | Freq: Every day | ORAL | 0 refills | Status: DC

## 2023-11-01 MED ORDER — METHYLPREDNISOLONE ACETATE 40 MG/ML IJ SUSP
40.0000 mg | Freq: Once | INTRAMUSCULAR | Status: AC
  Administered 2023-11-01: 40 mg via INTRA_ARTICULAR

## 2023-11-01 NOTE — Progress Notes (Addendum)
Office Visit Note   Patient: Ronnie Patrick           Date of Birth: September 21, 1951           MRN: [id number] Visit Date: 11/01/2023 Requested by: Baxter Flattery, MD [address] [place],  [place] [address] PCP: Couincil, Dellie Burns, MD   Assessment & Plan:  72 year old male with shoulder pain which is actually in his scapula seems to be coming from his degenerative disc disease  Recommend physical therapy  Prednisone Dosepak hold naproxen for 2 weeks then resume naproxen  Injection right long finger for triggering  Trigger finger injection  Diagnosis right long finger tenosynovitis Procedure injection A1 pulley Medications lidocaine 1% 1 mL and Depo-Medrol 40 mg 1 mL Skin prep alcohol and ethyl chloride Verbal consent was obtained Timeout confirmed the injection site  After cleaning the skin with alcohol and anesthetizing the skin with ethyl chloride the A1 pulley was palpated and the injection was performed without complication   Follow-up as needed  Encounter Diagnoses  Name Primary?   DDD (degenerative disc disease), cervical    Radicular pain of left upper extremity    Trigger finger, right middle finger Yes    Meds ordered this encounter  Medications   methylPREDNISolone acetate (DEPO-MEDROL) injection 40 mg   predniSONE (STERAPRED UNI-PAK 48 TAB) 10 MG (48) TBPK tablet    Sig: Take by mouth daily.    Dispense:  48 tablet    Refill:  0       Subjective: Chief Complaint  Patient presents with   Shoulder Pain    Left shoulder pain no injuries  pain started 2 weeks ago went to [facility] had xrays on Sunday Dr Hyacinth Meeker Ref'd to Dr Romeo Apple     HPI: 72 year old male from [place] comes SS follow-up from the ER.  Presented to the ER with shoulder pain had a shoulder x-ray.  X-ray shows some streaking calcification in the rotator cuff and his pain was presumed to be from that  However he has no shoulder joint pain and has full range of motion without  weakness in his left shoulder  He has a history of cervical disc disease and the pain is in his scapula and trapezius muscle  He tried to get the medication prescribed from the ER which was cyclobenzaprine 7.5 mg but it was not covered by insurance  No radicular symptoms at this time              ROS: Nothing related   Images personally read and my interpretation : Internal imaging shows cervical disc disease loss of lordosis  Outside imaging shows rotator cuff streaking calcification with no evidence of arthritis  Visit Diagnoses:  1. Trigger finger, right middle finger   2. DDD (degenerative disc disease), cervical   3. Radicular pain of left upper extremity      Follow-Up Instructions: Return if symptoms worsen or fail to improve.    Objective: Vital Signs: There were no vitals taken for this visit.  Physical Exam Vitals and nursing note reviewed.  Constitutional:      Appearance: Normal appearance.  HENT:     Head: Normocephalic and atraumatic.  Eyes:     General: No scleral icterus.       Right eye: No discharge.        Left eye: No discharge.     Extraocular Movements: Extraocular movements intact.     Conjunctiva/sclera: Conjunctivae normal.  Pupils: Pupils are equal, round, and reactive to light.  Cardiovascular:     Rate and Rhythm: Normal rate.     Pulses: Normal pulses.  Skin:    General: Skin is warm and dry.     Capillary Refill: Capillary refill takes less than 2 seconds.  Neurological:     General: No focal deficit present.     Mental Status: He is alert and oriented to person, place, and time.  Psychiatric:        Mood and Affect: Mood normal.        Behavior: Behavior normal.        Thought Content: Thought content normal.        Judgment: Judgment normal.      Right Shoulder Exam  Right shoulder exam is normal.  Tenderness  The patient is experiencing no tenderness.  Range of Motion  The patient has normal right shoulder  ROM.  Muscle Strength  The patient has normal right shoulder strength.  Tests  Apprehension: negative  Other  Erythema: absent Sensation: normal Pulse: present  Comments:  Right hand long finger tenderness over the A1 pulley intact flexor tendons intact extensor tendons   Left Shoulder Exam  Left shoulder exam is normal.  Tenderness  The patient is experiencing no tenderness.   Range of Motion  The patient has normal left shoulder ROM.  Muscle Strength  The patient has normal left shoulder strength.  Tests  Apprehension: negative  Other  Erythema: absent Sensation: normal Pulse: present   Comments:  Left periscapular region tenderness tenderness in the rhomboids tenderness in the trapezius muscle       Specialty Comments:  No specialty comments available.  Imaging: DG Cervical Spine 2 or 3 views  Result Date: 11/01/2023 X-ray report Chief complaint shoulder pain left Images cervical spine Reading: Loss of cervical lordosis endplate changes throughout the mid to lower cervical spine Impression: Cervical spondylosis     PMFS History: Patient Active Problem List   Diagnosis Date Noted   Adrenal mass (HCC) 10/30/2023   Allergic rhinitis 10/30/2023   Asthma 10/30/2023   BPH associated with nocturia 10/30/2023   Chronic hypokalemia 10/30/2023   Dry eyes 10/30/2023   Dry skin dermatitis 10/30/2023   Epigastric pain 10/30/2023   Erectile dysfunction 10/30/2023   External hordeolum 10/30/2023   Gastro-esophageal reflux disease with esophagitis 10/30/2023   High risk homosexual behavior 10/30/2023   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   Hyperlipidemia 10/30/2023   Hypertension 10/30/2023   Metabolic syndrome X 10/30/2023   Mood disorder (HCC) 10/30/2023   OA (osteoarthritis of the spine) 10/30/2023   Pancreatic insufficiency 10/30/2023   Pre-diabetes 10/30/2023   Premature ejaculation 10/30/2023   Raynaud's phenomenon 10/30/2023    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   Syphilis 10/30/2023   Tendinitis of finger of right hand 10/30/2023   Tick bite 10/30/2023   Chronic constipation 10/30/2023   Chronic pancreatitis (HCC) 10/30/2023   Slow transit constipation 10/30/2023   Diarrhea of presumed infectious origin 01/22/2023   Left lower quadrant abdominal pain 01/22/2023   Genital herpes simplex 07/06/2022   Hematoma of abdominal wall, initial encounter 05/04/2021   Recurrent right inguinal hernia 03/09/2021   Right lower quadrant abdominal pain 08/25/2020   Inguinal hernia 08/25/2020   Irritable bowel syndrome with both constipation and diarrhea 08/25/2020   Polyp of ascending colon    Chronic diarrhea 02/16/2019   Lactose intolerance 05/06/2018   Dyspepsia 05/29/2017   Irregular bowel habits  05/09/2017   Constipation 05/09/2017   Dysphagia    GERD (gastroesophageal reflux disease) 11/07/2016   Rectal discharge    Hemorrhoids, external without complications 06/29/2015   Soiling 06/29/2015   Past Medical History:  Diagnosis Date   Basal cell carcinoma (BCC)    skin cancer on scalp-been removed   Chronic pancreatitis (HCC)    Exocrine pancreatic insufficiency    Gastritis    GERD (gastroesophageal reflux disease)    Hiatal hernia    HTN (hypertension)    Hyperlipidemia    IBS (irritable bowel syndrome)    Jock itch    LAMISIL/TERBINAFINE REQUIRED   Lyme disease JUN 2016    Family History  Problem Relation Age of Onset   Allergic rhinitis Mother    Eczema Mother    Urticaria Mother    Asthma Father    Colon cancer Neg Hx    Colon polyps Neg Hx     Past Surgical History:  Procedure Laterality Date   BIOPSY  02/20/2017   Procedure: BIOPSY;  Surgeon: West Bali, MD;  Location: [facility];  Service: Endoscopy;;  gastric and esophageal   BIOPSY  04/13/2019   Procedure: BIOPSY;  Surgeon: West Bali, MD;  Location: [facility];  Service: Endoscopy;;  colon   COLONOSCOPY  DEC 2015 [facility]    [facility]   COLONOSCOPY WITH PROPOFOL N/A 04/13/2019   Procedure: COLONOSCOPY WITH PROPOFOL;  Surgeon: West Bali, MD;  Location: [facility];  Service: Endoscopy;  Laterality: N/A;  8:30am   ESOPHAGOGASTRODUODENOSCOPY N/A 02/20/2017   Procedure: ESOPHAGOGASTRODUODENOSCOPY (EGD);  Surgeon: West Bali, MD;  Location: [facility];  Service: Endoscopy;  Laterality: N/A;  215   EXCISIONAL HEMORRHOIDECTOMY     AGE 43-PAINFUL   FLEXIBLE SIGMOIDOSCOPY N/A 07/15/2015   Procedure: FLEXIBLE SIGMOIDOSCOPY;  Surgeon: West Bali, MD;  Location: [facility];  Service: Endoscopy;  Laterality: N/A;  100 - moved to 12:00 - office to notify   HEMORRHOID BANDING N/A 07/15/2015   Procedure: HEMORRHOID BANDING;  Surgeon: West Bali, MD;  Location: [facility];  Service: Endoscopy;  Laterality: N/A;   HERNIA REPAIR Bilateral 10/2020   INGUINAL HERNIA REPAIR Right 04/24/2021   Procedure: HERNIA REPAIR INGUINAL ADULT/ RECURRENT;  Surgeon: Lucretia Roers, MD;  Location: [facility];  Service: General;  Laterality: Right;   POLYPECTOMY  04/13/2019   Procedure: POLYPECTOMY;  Surgeon: West Bali, MD;  Location: [facility];  Service: Endoscopy;;  colon   TONSILLECTOMY AND ADENOIDECTOMY     AS A CHILD   UPPER GASTROINTESTINAL ENDOSCOPY  DEC 2015 [facility]   GERD, HH   Social History   Occupational History   Not on file  Tobacco Use   Smoking status: Former    Current packs/day: 0.00    Average packs/day: 1 pack/day for 34.0 years (34.0 ttl pk-yrs)    Types: Cigarettes    Start date: 07/15/1967    Quit date: 07/14/2001    Years since quitting: 22.3    Passive exposure: Past   Smokeless tobacco: Never   Tobacco comments:    Quit in 2002  Vaping Use   Vaping status: Never Used  Substance and Sexual Activity   Alcohol use: No    Alcohol/week: 0.0 standard drinks of alcohol   Drug use: No   Sexual activity: Yes

## 2023-11-01 NOTE — Patient Instructions (Signed)
Physical therapy has been ordered for you at [facility] They should call you to schedule, [phone]  is the phone number to call, if you want to call to schedule.   

## 2023-11-26 ENCOUNTER — Encounter (INDEPENDENT_AMBULATORY_CARE_PROVIDER_SITE_OTHER): Payer: Self-pay | Admitting: Gastroenterology

## 2023-11-26 ENCOUNTER — Ambulatory Visit (INDEPENDENT_AMBULATORY_CARE_PROVIDER_SITE_OTHER): Payer: Medicare HMO | Admitting: Gastroenterology

## 2023-11-26 VITALS — BP 166/90 | HR 60 | Ht 66.0 in | Wt 153.6 lb

## 2023-11-26 DIAGNOSIS — K582 Mixed irritable bowel syndrome: Secondary | ICD-10-CM | POA: Diagnosis not present

## 2023-11-26 DIAGNOSIS — K219 Gastro-esophageal reflux disease without esophagitis: Secondary | ICD-10-CM

## 2023-11-26 MED ORDER — MIRTAZAPINE 7.5 MG PO TABS: 7.5000 mg | ORAL_TABLET | Freq: Every day | ORAL | 3 refills | Status: DC

## 2023-11-26 MED ORDER — FAMOTIDINE 40 MG PO TABS: 40.0000 mg | ORAL_TABLET | Freq: Every day | ORAL | 3 refills | Status: DC

## 2023-11-26 NOTE — Patient Instructions (Addendum)
continue remeron 7.5mg  daily, refill sent  -Continue protonix 40mg  daily -Continue ibsrela 50mg  twice daily  -Famotidine 40mg  as needed once daily, if you are needing this more than once daily, please make me aware -continue with docusate as you are doing -If you decide to try CBT or hypnotherapy, please let me know how this works for you  We will plan to follow up in 6 months, as always, please reach out sooner with any questions or concerns   It was a pleasure to see you today. I want to create trusting relationships with patients and provide genuine, compassionate, and quality care. I truly value your feedback! please be on the lookout for a survey regarding your visit with me today. I appreciate your input about our visit and your time in completing this!    Ronnie Patrick L. Ronnie Hubert, MSN, APRN, AGNP-C Adult-Gerontology Nurse Practitioner [facility]

## 2023-11-26 NOTE — Progress Notes (Addendum)
Referring Provider: Sherlyn Hay, DO Primary Care Physician:  Baxter Flattery, MD Primary GI Physician: Dr. Levon Hedger   Chief Complaint  Patient presents with   Irritable Bowel Syndrome    Follow up on IBS.    HPI:   Ronnie Patrick is a 72 y.o. male with past medical history of  lactose intolerance, red scrotum syndrome, HTN, HLD, GERD, hx of Lyme disease, IBS-M   Patient presenting today for follow up of IBS-M and GERD  Last seen June 2024, at that time doing much better on Ibsrela which she began on 5/22.  Having 2-4 BMs per day on average though only 1 BM on days he did not take docusate which he started back on on 5/24 at bedtime.  Had increase docusate to twice daily dosing on 6/1 and had more diarrhea so decrease back to daily dosing.  Feeling he had to strain some to defecate.  Drinking 70+ ounces water.  Abdominal pain also improved Ibsrela.  Recommended continue Ibsrela 50 mg twice daily, continue good water intake, docusate alternate 1 1 night to the next, consider Remeron for abdominal pain, consider CT angio A/P if pain worsens, associated weight loss, rectal bleeding or other alarm symptoms.  Patient was scheduled for CT angio A/P in July which did not show evidence of significant stenosis or occlusion of the visceral arteries and was not suggestive of chronic mesenteric ischemia.  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  Diffuse atherosclerotic calcifications of the abdominal aorta without aneurysm, dissection or stenosis.  Patient was started on low-dose of Remeron and dosage was increased to 15 mg in October as he felt it was helping but he had plateaued with results.  He also inquired about CBT therapy to help with his IBS which we attempted through [facility] but was never able to successfully get in  touch with the company to initiate CBT therapy.  Present: Doing well on Ibsrela 50mg  BID. Feels that this works better than other therapies he has been on.   He did see Dr. Henreitta Leber regarding tacks used in hernia repair years back. He was told by her that she should see IR to discuss nerve blocks due to the tacks. He notes pain has improved some recently, not having much pain. He is going to consider IR if pain recurs   He notes that given Ronnie Patrick did not work out he looked up other alternatives to CBT therapy which included hypnotherapy and a calmgut app and has questions on whether either of these would be beneficial.   He notes that on 11/10 he was seen in the ED in [facility] as he had some pain in his Left arm radiating to his Left back. Cardiac workup negative. Was suggested to see orthopedic surgeon for further evaluation as he was told it was his rotator cuff. States he was prescribed prednisone which caused constipation and had flare of his GERD, having to eat a lot of tums. GERD seems to be almost back to baseline now on his protonix 40mg . He reports he was on famotidine 40mg  and wonders if he can have this as a PRN med to use.    He does note his mother passed away recently as well which he has had to deal with the grief process of but seems to be doing better and feeling more at peace  with this now.    He tried 15mg  of mirtazepine but because of the constipation from prednisone he cut back to 7.5mg . he notes much less diarrhea since taking prednisone. He did increase docusate to 3 per night after prednisone caused more constipation. He does feel that mirtazepine raises his BP though he is okay with this as this medication seems to help his IBS. Having more frequent BMs the longer he has been off of prednisone, having on average 1-3 BMs per day now, a few loose stools but no watery stools. Denies rectal bleeding or melena.     CT A/P with contrast 01/30/23: No acute findings in the abdomen or  pelvis. Specifically, no findings to explain the patient's history of lower abdominal pain. 2. Stable 17 mm right adrenal adenoma. No followup imaging is recommended. 3. Prostatomegaly. 4.  Aortic Atherosclerosis  Last Colonoscopy:04/13/19- Two 4 to 5 mm polyps at the hepatic flexure, removed with a cold snare. Resected and retrieved.serrated lesion in hepatic flexure and a tubular adenoma. Random biopsies neg. - External and internal hemorrhoids. - MILDLY Tortuous LEFT colon. - NO OBVIOUS SOURCE FOR DIARRHEA IDENTIFIED Repeat in 5 years  Last Endoscopy:02/20/17 small hiatal hernia, gastritis-negative, benign gastric polyps:    Past Medical History:  Diagnosis Date   Basal cell carcinoma (BCC)    skin cancer on scalp-been removed   Chronic pancreatitis (HCC)    Exocrine pancreatic insufficiency    Gastritis    GERD (gastroesophageal reflux disease)    Hiatal hernia    HTN (hypertension)    Hyperlipidemia    IBS (irritable bowel syndrome)    Jock itch    LAMISIL/TERBINAFINE REQUIRED   Lyme disease JUN 2016    Past Surgical History:  Procedure Laterality Date   BIOPSY  02/20/2017   Procedure: BIOPSY;  Surgeon: West Bali, MD;  [facility];  Service: Endoscopy;;  gastric and esophageal   BIOPSY  04/13/2019   Procedure: BIOPSY;  Surgeon: West Bali, MD;  [facility];  Service: Endoscopy;;  colon   COLONOSCOPY  DEC 2015 [facility]   [facility]   COLONOSCOPY WITH PROPOFOL N/A 04/13/2019   Procedure: COLONOSCOPY WITH PROPOFOL;  Surgeon: West Bali, MD;  [facility];  Service: Endoscopy;  Laterality: N/A;  8:30am   ESOPHAGOGASTRODUODENOSCOPY N/A 02/20/2017   Procedure: ESOPHAGOGASTRODUODENOSCOPY (EGD);  Surgeon: West Bali, MD;  [facility];  Service: Endoscopy;  Laterality: N/A;  215   EXCISIONAL HEMORRHOIDECTOMY     AGE 85-PAINFUL   FLEXIBLE SIGMOIDOSCOPY N/A 07/15/2015   Procedure: FLEXIBLE SIGMOIDOSCOPY;  Surgeon: West Bali,  MD;  [facility];  Service: Endoscopy;  Laterality: N/A;  100 - moved to 12:00 - office to notify   HEMORRHOID BANDING N/A 07/15/2015   Procedure: HEMORRHOID BANDING;  Surgeon: West Bali, MD;  [facility];  Service: Endoscopy;  Laterality: N/A;   HERNIA REPAIR Bilateral 10/2020   INGUINAL HERNIA REPAIR Right 04/24/2021   Procedure: HERNIA REPAIR INGUINAL ADULT/ RECURRENT;  Surgeon: Lucretia Roers, MD;  [facility];  Service: General;  Laterality: Right;   POLYPECTOMY  04/13/2019   Procedure: POLYPECTOMY;  Surgeon: West Bali, MD;  [facility];  Service: Endoscopy;;  colon   TONSILLECTOMY AND ADENOIDECTOMY     AS A CHILD   UPPER GASTROINTESTINAL ENDOSCOPY  DEC 2015 [facility]   GERD, HH    Current Outpatient Medications  Medication Sig Dispense Refill  acetaminophen (TYLENOL) 500 MG tablet Take 500 mg by mouth 2 (two) times daily.     acyclovir (ZOVIRAX) 400 MG tablet Take 400 mg by mouth 2 (two) times daily.     atorvastatin (LIPITOR) 40 MG tablet Take 40 mg by mouth daily.     calcium carbonate (TUMS EX) 750 MG chewable tablet Chew 1 tablet by mouth 3 (three) times daily as needed for heartburn.     docusate sodium (COLACE) 100 MG capsule Take 300 mg by mouth daily.     Dupilumab (DUPIXENT) 300 MG/2ML SOPN Inject 300 mg every 2 weeks by subcutaneous route.     famotidine (PEPCID) 40 MG tablet Take 1 tablet (40 mg total) by mouth at bedtime. 90 tablet 3   fexofenadine (ALLEGRA) 180 MG tablet Take 180 mg by mouth daily.     fluticasone (FLONASE) 50 MCG/ACT nasal spray Place 1 spray into both nostrils 2 (two) times daily at 10 AM and 5 PM.     levocetirizine (XYZAL) 5 MG tablet Take 5 mg by mouth every evening.     metoprolol tartrate (LOPRESSOR) 25 MG tablet Take 25 mg by mouth 2 (two) times daily. Hold evening dose if SBP <120     mirtazapine (REMERON) 7.5 MG tablet Take 1 tablet (7.5 mg total) by mouth at bedtime. 90 tablet 3   Multiple  Vitamin (MULTIVITAMIN WITH MINERALS) TABS tablet Take 1 tablet by mouth daily.     naproxen (NAPROSYN) 500 MG tablet Take 500 mg by mouth 2 (two) times daily with a meal.     pantoprazole (PROTONIX) 40 MG tablet Take 1 tablet (40 mg total) by mouth daily. 90 tablet 3   polyvinyl alcohol (LIQUIFILM TEARS) 1.4 % ophthalmic solution Place 1 drop into both eyes every 4 (four) hours as needed for dry eyes.     sildenafil (VIAGRA) 50 MG tablet Take 1 tablet (50 mg total) by mouth daily as needed for erectile dysfunction. 30 tablet 5   sodium chloride (OCEAN) 0.65 % SOLN nasal spray Place 1 spray into both nostrils as needed for congestion.     tadalafil (CIALIS) 5 MG tablet Take 1 tablet (5 mg total) by mouth daily. 90 tablet 3   Tenapanor HCl (IBSRELA) 50 MG TABS Take 50 mg by mouth 2 (two) times daily. 180 tablet 3   VENTOLIN HFA 108 (90 BASE) MCG/ACT inhaler Inhale 2 puffs into the lungs every 4 (four) hours as needed for wheezing or shortness of breath.      No current facility-administered medications for this visit.    Allergies as of 11/26/2023 - Review Complete 11/26/2023  Allergen Reaction Noted   Bacitracin Hives 02/28/2017   Flagyl [metronidazole] Itching 06/29/2015   Keflex [cephalexin] Other (See Comments) 10/27/2023   Neosporin original [bacitracin-neomycin-polymyxin]  11/20/2021   Neosporin [neomycin-bacitracin zn-polymyx] Hives 02/28/2017   Penicillins Itching 06/29/2015   Prednisone  11/26/2023   Sulfa antibiotics  10/27/2023    Family History  Problem Relation Age of Onset   Allergic rhinitis Mother    Eczema Mother    Urticaria Mother    Asthma Father    Colon cancer Neg Hx    Colon polyps Neg Hx     Social History   Socioeconomic History   Marital status: Divorced    Spouse name: Not on file   Number of children: Not on file   Years of education: Not on file   Highest education level: Not on file  Occupational History  Not on file  Tobacco Use   Smoking  status: Former    Current packs/day: 0.00    Average packs/day: 1 pack/day for 34.0 years (34.0 ttl pk-yrs)    Types: Cigarettes    Start date: 07/15/1967    Quit date: 07/14/2001    Years since quitting: 22.3    Passive exposure: Past   Smokeless tobacco: Never   Tobacco comments:    Quit in 2002  Vaping Use   Vaping status: Never Used  Substance and Sexual Activity   Alcohol use: No    Alcohol/week: 0.0 standard drinks of alcohol   Drug use: No   Sexual activity: Yes  Other Topics Concern   Not on file  Social History Narrative   ORIGINALLY FROM [place]. USED TO LOVE TO VACATION IN [place]: [place], [place].    JOB: RESEARCH TECHNICIAN, DEGREE IN COMPUTER SCIENCE, SOLD INSURANCE.      2 SONS:  THEY LIVE IN  WITH EX-WIFE.   SEXUAL ORIENTATION: ATTRACTED TO MEN.   RARE ETOH OR SWEETS. HAS A TWIN BROTHER WITH DIABETES.   Social Determinants of Health   Financial Resource Strain: Not on file  Food Insecurity: Not on file  Transportation Needs: Not on file  Physical Activity: Not on file  Stress: Not on file  Social Connections: Not on file   Review of systems General: negative for malaise, night sweats, fever, chills, weight loss Neck: Negative for lumps, goiter, pain and significant neck swelling Resp: Negative for cough, wheezing, dyspnea at rest CV: Negative for chest pain, leg swelling, palpitations, orthopnea GI: denies melena, hematochezia, nausea, vomiting, diarrhea, constipation, dysphagia, odyonophagia, early satiety or unintentional weight loss.  The remainder of the review of systems is noncontributory.  Physical Exam: BP (!) 152/93 (BP Location: Left Arm, Patient Position: Sitting, Cuff Size: Normal)   Pulse 60   Ht 5\' 6"  (1.676 m)   Wt 153 lb 9.6 oz (69.7 kg)   BMI 24.79 kg/m  General:   Alert and oriented. No distress noted. Pleasant and cooperative.  Head:  Normocephalic and atraumatic. Eyes:  Conjuctiva clear without scleral icterus. Heart: Normal  rate and rhythm, s1 and s2 heart sounds present.  Lungs: Clear lung sounds in all lobes. Respirations equal and unlabored. Abdomen:  +BS, soft, non-tender and non-distended. No rebound or guarding. No HSM or masses noted. Derm: No palmar erythema or jaundice Neurologic:  Alert and  oriented x4 Psych:  Alert and cooperative. Normal mood and affect.  Invalid input(s): "6 MONTHS"   ASSESSMENT: Ronnie Patrick is a 72 y.o. male presenting today for follow up of IBS-M and GERD  IBS-M: seems to be doing well on Ibsrela 50mg  BID and remeron 7.5mg  at bedtime.  Patient reports Allena Napoleon seems to have provided more control of his IBS and other therapies he has been on.  Not having much abdominal pain at this time.  Tried higher dose of Remeron at 15 mg/day though had also been a course of prednisone around that time which he felt caused him to feel more constipated therefore he decreased Remeron back to 7.5 mg and feels that this is adequate for him currently.  He is taking docusate 3 tablets/day as well and having 1-3 BMs per day stools are solid to looser.  No rectal bleeding or melena.    GERD: Well-managed on pantoprazole 40 mg daily though did note a flare of his GERD when he was on course of prednisone as above.  Current is feeling back to  baseline at this time with rare breakthrough symptoms.  He requested to have a prescription of famotidine on hand to take as needed for breakthrough which I think is reasonable.  If he is requiring famotidine more than once daily he should make me aware.   PLAN:  -continue remeron 7.5mg  daily  -Continue protonix 40mg  daily -Continue ibsrela 50mg  BID -Famotidine 40mg  at bedtime PRN  -continue docusate  All questions were answered, patient verbalized understanding and is in agreement with plan as outlined above.    Follow Up: 6 months   Ronnie Patrick L. Jeanmarie Hubert, MSN, APRN, AGNP-C Adult-Gerontology Nurse Practitioner [facility]  I have  reviewed the note and agree with the APP's assessment as described in this progress note   Katrinka Blazing, MD Gastroenterology and Hepatology [facility]

## 2023-12-02 ENCOUNTER — Ambulatory Visit: Payer: Medicare HMO | Admitting: Urology

## 2023-12-02 ENCOUNTER — Encounter: Payer: Self-pay | Admitting: Urology

## 2023-12-02 VITALS — BP 159/80 | HR 79

## 2023-12-02 DIAGNOSIS — N281 Cyst of kidney, acquired: Secondary | ICD-10-CM

## 2023-12-02 DIAGNOSIS — N401 Enlarged prostate with lower urinary tract symptoms: Secondary | ICD-10-CM | POA: Diagnosis not present

## 2023-12-02 DIAGNOSIS — N138 Other obstructive and reflux uropathy: Secondary | ICD-10-CM | POA: Diagnosis not present

## 2023-12-02 DIAGNOSIS — D3501 Benign neoplasm of right adrenal gland: Secondary | ICD-10-CM | POA: Diagnosis not present

## 2023-12-02 DIAGNOSIS — N5201 Erectile dysfunction due to arterial insufficiency: Secondary | ICD-10-CM | POA: Diagnosis not present

## 2023-12-02 LAB — URINALYSIS, ROUTINE W REFLEX MICROSCOPIC
Bilirubin, UA: NEGATIVE
Glucose, UA: NEGATIVE
Ketones, UA: NEGATIVE
Leukocytes,UA: NEGATIVE
Nitrite, UA: NEGATIVE
Protein,UA: NEGATIVE
RBC, UA: NEGATIVE
Specific Gravity, UA: 1.015 (ref 1.005–1.030)
Urobilinogen, Ur: 0.2 mg/dL (ref 0.2–1.0)
pH, UA: 7 (ref 5.0–7.5)

## 2023-12-02 NOTE — Progress Notes (Signed)
12/02/2023 1:14 PM   Ronnie Patrick 03/21/1951 [id number]  Referring provider: Sherlyn Hay, DO [address] [place],  [place] [address]  No chief complaint on file.   HPI:  F/u -    1) BPH-patient with symptoms of frequency day and night. Drinks 78 oz and no caffeine. Drinks up to bedtime. He does snore, but says he was checked for OSA (negative). Good stream. Void diary 12-14 x in 24 hrs. Noc x 3-5. CT a/p 08/22 with a 40 g prostate and mild BWT. PSA was 1.2 in Oct 2022. Repeat CT Apr 2023 with no BWT and small prostate.    No prior GU history or surgery. No NG risk. Oct 2022 tried daily tadalafil. Did well. Urinary frequency down 30-50%. Noc improved.    2) right adrenal nodule, left renal cysts -a 2.1 cm right adrenal nodule has been noted on CT scan 21 September 2020 and a 2.5 cm left renal cyst.  It is homogenous with attenuation of approximately 60 Hounsfield units.  It was stable on follow-up CT in 2022 and CT in April 2023.     3) ED - takes sildenafil boost prn. On daily pde5i as above.     Today, seen for the above. He is on brand name Cialis 5 mg daily. Voiding well. His Dec 2023 PSA was 1.2. He had c-spine issues and took prednisone. Increased frequency. Now back to normal.    [name] worked in Consulting civil engineer.   He underwent CT a/p scans Feb 2024 and Aug 2024 which revealed a benign GU tract, stable 18 mm adrenal adenoma, left renal cysts. Recent Cr 0.8.    PMH: Past Medical History:  Diagnosis Date   Basal cell carcinoma (BCC)    skin cancer on scalp-been removed   Chronic pancreatitis (HCC)    Exocrine pancreatic insufficiency    Gastritis    GERD (gastroesophageal reflux disease)    Hiatal hernia    HTN (hypertension)    Hyperlipidemia    IBS (irritable bowel syndrome)    Jock itch    LAMISIL/TERBINAFINE REQUIRED   Lyme disease JUN 2016    Surgical History: Past Surgical History:  Procedure Laterality Date   BIOPSY  02/20/2017   Procedure: BIOPSY;  Surgeon:  West Bali, MD;  [facility];  Service: Endoscopy;;  gastric and esophageal   BIOPSY  04/13/2019   Procedure: BIOPSY;  Surgeon: West Bali, MD;  [facility];  Service: Endoscopy;;  colon   COLONOSCOPY  DEC 2015 [facility]   [facility]   COLONOSCOPY WITH PROPOFOL N/A 04/13/2019   Procedure: COLONOSCOPY WITH PROPOFOL;  Surgeon: West Bali, MD;  [facility];  Service: Endoscopy;  Laterality: N/A;  8:30am   ESOPHAGOGASTRODUODENOSCOPY N/A 02/20/2017   Procedure: ESOPHAGOGASTRODUODENOSCOPY (EGD);  Surgeon: West Bali, MD;  [facility];  Service: Endoscopy;  Laterality: N/A;  215   EXCISIONAL HEMORRHOIDECTOMY     AGE 55-PAINFUL   FLEXIBLE SIGMOIDOSCOPY N/A 07/15/2015   Procedure: FLEXIBLE SIGMOIDOSCOPY;  Surgeon: West Bali, MD;  [facility];  Service: Endoscopy;  Laterality: N/A;  100 - moved to 12:00 - office to notify   HEMORRHOID BANDING N/A 07/15/2015   Procedure: HEMORRHOID BANDING;  Surgeon: West Bali, MD;  [facility];  Service: Endoscopy;  Laterality: N/A;   HERNIA REPAIR Bilateral 10/2020   INGUINAL HERNIA REPAIR Right 04/24/2021   Procedure: HERNIA REPAIR INGUINAL ADULT/ RECURRENT;  Surgeon: Lucretia Roers, MD;  Location: [facility];  Service: General;  Laterality: Right;   POLYPECTOMY  04/13/2019   Procedure: POLYPECTOMY;  Surgeon: West Bali, MD;  [facility];  Service: Endoscopy;;  colon   TONSILLECTOMY AND ADENOIDECTOMY     AS A CHILD   UPPER GASTROINTESTINAL ENDOSCOPY  DEC 2015 [facility]   GERD, HH    Home Medications:  Allergies as of 12/02/2023       Reactions   Bacitracin Hives   Flagyl [metronidazole] Itching   Keflex [cephalexin] Other (See Comments)   Neosporin Original [bacitracin-neomycin-polymyxin]    Other reaction(s): Unknown   Neosporin [neomycin-bacitracin Zn-polymyx] Hives   Penicillins Itching   Has patient had a PCN reaction causing immediate rash,  facial/tongue/throat swelling, SOB or lightheadedness with hypotension: No Has patient had a PCN reaction causing severe rash involving mucus membranes or skin necrosis: No Has patient had a PCN reaction that required hospitalization No Has patient had a PCN reaction occurring within the last 10 years: No If all of the above answers are "NO", then may proceed with Cephalosporin use.   Prednisone    Adverse reactions, patient had constipation/flare of GERD   Sulfa Antibiotics         Medication List        Accurate as of December 02, 2023  1:14 PM. If you have any questions, ask your nurse or doctor.          acetaminophen 500 MG tablet Commonly known as: TYLENOL Take 500 mg by mouth 2 (two) times daily.   acyclovir 400 MG tablet Commonly known as: ZOVIRAX Take 400 mg by mouth 2 (two) times daily.   atorvastatin 40 MG tablet Commonly known as: LIPITOR Take 40 mg by mouth daily.   calcium carbonate 750 MG chewable tablet Commonly known as: TUMS EX Chew 1 tablet by mouth 3 (three) times daily as needed for heartburn.   docusate sodium 100 MG capsule Commonly known as: COLACE Take 300 mg by mouth daily.   Dupixent 300 MG/2ML Soaj Generic drug: Dupilumab Inject 300 mg every 2 weeks by subcutaneous route.   famotidine 40 MG tablet Commonly known as: PEPCID Take 1 tablet (40 mg total) by mouth at bedtime.   fexofenadine 180 MG tablet Commonly known as: ALLEGRA Take 180 mg by mouth daily.   fluticasone 50 MCG/ACT nasal spray Commonly known as: FLONASE Place 1 spray into both nostrils 2 (two) times daily at 10 AM and 5 PM.   Ibsrela 50 MG Tabs Generic drug: Tenapanor HCl Take 50 mg by mouth 2 (two) times daily.   levocetirizine 5 MG tablet Commonly known as: XYZAL Take 5 mg by mouth every evening.   metoprolol tartrate 25 MG tablet Commonly known as: LOPRESSOR Take 25 mg by mouth 2 (two) times daily. Hold evening dose if SBP <120   mirtazapine 7.5 MG  tablet Commonly known as: REMERON Take 1 tablet (7.5 mg total) by mouth at bedtime.   multivitamin with minerals Tabs tablet Take 1 tablet by mouth daily.   naproxen 500 MG tablet Commonly known as: NAPROSYN Take 500 mg by mouth 2 (two) times daily with a meal.   pantoprazole 40 MG tablet Commonly known as: PROTONIX Take 1 tablet (40 mg total) by mouth daily.   polyvinyl alcohol 1.4 % ophthalmic solution Commonly known as: LIQUIFILM TEARS Place 1 drop into both eyes every 4 (four) hours as needed for dry eyes.   sildenafil 50 MG  tablet Commonly known as: VIAGRA Take 1 tablet (50 mg total) by mouth daily as needed for erectile dysfunction.   sodium chloride 0.65 % Soln nasal spray Commonly known as: OCEAN Place 1 spray into both nostrils as needed for congestion.   tadalafil 5 MG tablet Commonly known as: CIALIS Take 1 tablet (5 mg total) by mouth daily.   Ventolin HFA 108 (90 Base) MCG/ACT inhaler Generic drug: albuterol Inhale 2 puffs into the lungs every 4 (four) hours as needed for wheezing or shortness of breath.        Allergies:  Allergies  Allergen Reactions   Bacitracin Hives   Flagyl [Metronidazole] Itching   Keflex [Cephalexin] Other (See Comments)   Neosporin Original [Bacitracin-Neomycin-Polymyxin]     Other reaction(s): Unknown   Neosporin [Neomycin-Bacitracin Zn-Polymyx] Hives   Penicillins Itching    Has patient had a PCN reaction causing immediate rash, facial/tongue/throat swelling, SOB or lightheadedness with hypotension: No Has patient had a PCN reaction causing severe rash involving mucus membranes or skin necrosis: No Has patient had a PCN reaction that required hospitalization No Has patient had a PCN reaction occurring within the last 10 years: No If all of the above answers are "NO", then may proceed with Cephalosporin use.    Prednisone     Adverse reactions, patient had constipation/flare of GERD   Sulfa Antibiotics     Family  History: Family History  Problem Relation Age of Onset   Allergic rhinitis Mother    Eczema Mother    Urticaria Mother    Asthma Father    Colon cancer Neg Hx    Colon polyps Neg Hx     Social History:  reports that he quit smoking about 22 years ago. His smoking use included cigarettes. He started smoking about 56 years ago. He has a 34 pack-year smoking history. He has been exposed to tobacco smoke. He has never used smokeless tobacco. He reports that he does not drink alcohol and does not use drugs.   Physical Exam: BP (!) 159/80   Pulse 79   Constitutional:  Alert and oriented, No acute distress. HEENT: [place] AT, moist mucus membranes.  Trachea midline, no masses. Cardiovascular: No clubbing, cyanosis, or edema. Respiratory: Normal respiratory effort, no increased work of breathing. GI: Abdomen is soft, nontender, nondistended, no abdominal masses GU: No CVA tenderness Lymph: No cervical or inguinal lymphadenopathy. Skin: No rashes, bruises or suspicious lesions. Neurologic: Grossly intact, no focal deficits, moving all 4 extremities. Psychiatric: Normal mood and affect.  Laboratory Data: Lab Results  Component Value Date   WBC 6.1 04/21/2021   HGB 15.5 04/21/2021   HCT 46.1 04/21/2021   MCV 91.3 04/21/2021   PLT 202 04/21/2021    Lab Results  Component Value Date   CREATININE 0.80 01/30/2023    No results found for: "PSA"  No results found for: "TESTOSTERONE"  No results found for: "HGBA1C"  Urinalysis    Component Value Date/Time   APPEARANCEUR Clear 12/03/2022 1359   GLUCOSEU Negative 12/03/2022 1359   BILIRUBINUR Negative 12/03/2022 1359   PROTEINUR Negative 12/03/2022 1359   NITRITE Negative 12/03/2022 1359   LEUKOCYTESUR Negative 12/03/2022 1359    Lab Results  Component Value Date   LABMICR Comment 12/03/2022    Pertinent Imaging: CT 2024 images reviewed x 2   Assessment & Plan:    1. BPH with obstruction/lower urinary tract symptoms  (Primary) Continue daily cialis. PA forms filled and new Rx applied.  - Urinalysis, Routine  w reflex microscopic  2) ED - cont sildenafil. Can take up to 75 mg boost. No side effects.   3) renal cyst, adrenal adenoma - benign findings, stable    No follow-ups on file.  Jerilee Field, MD  [facility]  [address] [place], [place] [address] [phone]

## 2023-12-03 ENCOUNTER — Ambulatory Visit (INDEPENDENT_AMBULATORY_CARE_PROVIDER_SITE_OTHER): Payer: Medicare HMO | Admitting: Gastroenterology

## 2023-12-15 ENCOUNTER — Encounter (INDEPENDENT_AMBULATORY_CARE_PROVIDER_SITE_OTHER): Payer: Self-pay

## 2023-12-19 ENCOUNTER — Telehealth (INDEPENDENT_AMBULATORY_CARE_PROVIDER_SITE_OTHER): Payer: Self-pay

## 2023-12-19 NOTE — Telephone Encounter (Signed)
 Notice of Approval of Medicare Prescription Drug Coverage Date: 12/19/2023 [facility] [address] [place], [place] [address] Member Name: Ronnie Patrick Member ID Number: [id number] Thank you for trusting your Medicare prescription drug coverage to [facility] (HMO-POS). As our member, we want to help you get the most value from your prescription drug coverage and help you understand how your coverage works. As a member of [employer] (HMO-POS), we are pleased to inform you that, upon review of the information provided by you or your doctor, we have approved the requested coverage for the following prescription drug(s): IBSRELA  Tablet Type of coverage approved: Non-Formulary This approval authorizes your coverage from 12/18/2023 - 12/16/2024, unless we notify you otherwise, and as long as the following conditions apply: ? you remain enrolled in our Medicare Part D prescription drug plan, ? your physician or other prescriber continues to prescribe the medication for you, and ? the medication continues to be safe for treating your condition. Depending upon the strength and/or formulation of the drug prescribed by your physician, different quantity limits or safety edits may apply in the future. If you have not already filled your prescription for this approved drug, you may do so at a participating network pharmacy. Thank you for allowing us  to serve you. This letter is informational only. No further action is required by you at this time. If you have questions or need help, please talk to your doctor or pharmacist, or contact Customer Care at [phone], 24 hours a day, 7 days a week. TTY users may call [phone]. Thank you, [employer]

## 2024-01-02 ENCOUNTER — Telehealth (INDEPENDENT_AMBULATORY_CARE_PROVIDER_SITE_OTHER): Payer: Self-pay

## 2024-01-02 NOTE — Telephone Encounter (Signed)
[employer] sent a form stating the patient has been approved to receive IBSRELA from their Patient assistance program through 12/16/2024. ( I will have Ann scan into the patient's chart).

## 2024-01-15 ENCOUNTER — Other Ambulatory Visit (INDEPENDENT_AMBULATORY_CARE_PROVIDER_SITE_OTHER): Payer: Self-pay | Admitting: Gastroenterology

## 2024-01-15 DIAGNOSIS — K219 Gastro-esophageal reflux disease without esophagitis: Secondary | ICD-10-CM

## 2024-01-23 ENCOUNTER — Other Ambulatory Visit: Payer: Self-pay

## 2024-01-23 ENCOUNTER — Telehealth: Payer: Self-pay

## 2024-01-23 DIAGNOSIS — N138 Other obstructive and reflux uropathy: Secondary | ICD-10-CM

## 2024-01-23 MED ORDER — TADALAFIL 5 MG PO TABS: 5.0000 mg | ORAL_TABLET | Freq: Every day | ORAL | 3 refills | Status: DC

## 2024-01-23 NOTE — Telephone Encounter (Signed)
 Called to verify pharmacy Pt stated he believes the pharmacy that reached out to us  is correct Rx refill form faxed out to [facility]

## 2024-03-18 ENCOUNTER — Encounter (INDEPENDENT_AMBULATORY_CARE_PROVIDER_SITE_OTHER): Payer: Self-pay

## 2024-05-05 ENCOUNTER — Encounter: Payer: Self-pay | Admitting: Urology

## 2024-05-05 NOTE — Telephone Encounter (Signed)
 FYI and advise

## 2024-05-13 NOTE — Telephone Encounter (Signed)
 FYI and advise

## 2024-05-26 ENCOUNTER — Ambulatory Visit (INDEPENDENT_AMBULATORY_CARE_PROVIDER_SITE_OTHER): Payer: Medicare HMO | Admitting: Gastroenterology

## 2024-05-26 ENCOUNTER — Encounter (INDEPENDENT_AMBULATORY_CARE_PROVIDER_SITE_OTHER): Payer: Self-pay | Admitting: Gastroenterology

## 2024-05-26 VITALS — BP 158/85 | HR 63 | Temp 97.1°F | Ht 66.0 in | Wt 157.1 lb

## 2024-05-26 DIAGNOSIS — K219 Gastro-esophageal reflux disease without esophagitis: Secondary | ICD-10-CM | POA: Diagnosis not present

## 2024-05-26 DIAGNOSIS — K582 Mixed irritable bowel syndrome: Secondary | ICD-10-CM | POA: Diagnosis not present

## 2024-05-26 NOTE — Patient Instructions (Signed)
-  continue protonix  40mg  in the evenings as you are doing  -take famotidine  40mg  30-45 minutes prior to dinner -we may consider xifaxan for IBS flare if diarrhea recurs -keep a log of abdominal pain and foods eaten prior to this to help determine source of pain -be mindful of greasy, spicy, fried, citrus foods,  caffeine, carbonated drinks, coffee, chocolate and alcohol as these can increase reflux symptoms Stay upright 2-3 hours after eating, prior to lying down and avoid eating late in the evenings.  Follow up 6 months  It was a pleasure to see you today. I want to create trusting relationships with patients and provide genuine, compassionate, and quality care. I truly value your feedback! please be on the lookout for a survey regarding your visit with me today. I appreciate your input about our visit and your time in completing this!    Eriel Dunckel L. Iqra Rotundo, MSN, APRN, AGNP-C Adult-Gerontology Nurse Practitioner [facility] at [facility]

## 2024-05-26 NOTE — Progress Notes (Incomplete)
 Referring Provider: Couincil, [place], MD Primary Care Physician:  Couincil, [place], MD Primary GI Physician: Dr. Sammi Crick   Chief Complaint  Patient presents with   Follow-up    Patient here today for a follow up on IBS, which patient says this is doing better, but he is now having issues with Tonna Frederic. He is currently on pantoprazole  40 mg Qhs and Famotidine  around brunch prn. He says he thinks related to increase of the tadalafil  to 10 mg.   HPI:   Ronnie Patrick is a 73 y.o. male with past medical history of  lactose intolerance, red scrotum syndrome, HTN, HLD, GERD, hx of Lyme disease, IBS-M    Patient presenting today for:  Follow up of IBS-M and GERD  Last seen December 2024, at that time Doing well on Ibsrela  50mg  BID. Feels that this works better than other therapies he has been on. saw  Dr. Collene Dawson regarding tacks used in hernia repair years back. He was told by her that she should see IR to discuss nerve blocks due to the tacks. Pain had improved, He is going to consider IR if pain recurs recently prescribed prednisone  which caused constipation and had flare of his GERD, which had improved with use of protonix  40mg  daily. He reports he was on famotidine  40mg  and wonders if he can have this as a PRN med to use. tried 15mg  of mirtazepine but because of the constipation from prednisone  he cut back to 7.5mg . much less diarrhea since taking prednisone .  did increase docusate to 3 per night after prednisone  caused more constipation. having on average 1-3 BMs per day now, a few loose stools but no watery stools.   Present:  States he had a penicillin allergy  test after his last visit, deemed non allergic to penicillin. He had amoxicillin fro a dental abscess thereafter and tolerated this well. He did take probiotic at my recommendation while on the antibiotic which helped to avoid a lot of diarrhea.   IBS is doing much better, not taking Ibsrela  or docusate since December. Questions  SIBO/SIMO. Has some bloating of upper abdomen after eating at times. He endorses some gas at time but does not feel this is in excess. He states BMs are good right now. Having regular, soft but formed stools, sometimes looser but not watery. On average having 2-4 BMs per day. Rare diarrhea, last when on clindamycin/doxy.   Reports tidalafil was increased from 5 to 10mg  nightly due to worsening BPH. He was told he could split the dose but prefers to take 10mg  at bedtime. He had initial worsening of GERD, for which he started famotidine  around noon for several days. He has stopped this. He has occasional flares of GERD, last was last night. He is taking protonix  40mg  nightly, 30-45 mins before taking his tidalafil. He takes these shortly before bed. Will often have GERD symptoms sometimes in the night which will wake him up. He thinks this is occurring maybe a few hours after he takes his tidalafil.   He has stopped remeron  due to side effects, he states this caused impotence and he did not like this side effect. He did feel it helped but wants to avoid taking this for now  He has had some more abdominal pain at times. He is concerned about knowing what pain is related to IBS vs. Known existing tacks from hernia repair in the past.    CT A/P with contrast 01/30/23: No acute findings in the abdomen or  pelvis. Specifically, no findings to explain the patient's history of lower abdominal pain. 2. Stable 17 mm right adrenal adenoma. No followup imaging is recommended. 3. Prostatomegaly. 4.  Aortic Atherosclerosis  Last Colonoscopy:04/13/19- Two 4 to 5 mm polyps at the hepatic flexure, removed with a cold snare. Resected and retrieved.serrated lesion in hepatic flexure and a tubular adenoma. Random biopsies neg. - External and internal hemorrhoids. - MILDLY Tortuous LEFT colon. - NO OBVIOUS SOURCE FOR DIARRHEA IDENTIFIED Repeat in 5 years  Last Endoscopy:02/20/17 small hiatal hernia, gastritis-negative,  benign gastric polyps:    Past Medical History:  Diagnosis Date   Basal cell carcinoma (BCC)    skin cancer on scalp-been removed   Chronic pancreatitis (HCC)    Exocrine pancreatic insufficiency    Gastritis    GERD (gastroesophageal reflux disease)    Hiatal hernia    HTN (hypertension)    Hyperlipidemia    IBS (irritable bowel syndrome)    Jock itch    LAMISIL/TERBINAFINE REQUIRED   Lyme disease JUN 2016    Past Surgical History:  Procedure Laterality Date   BIOPSY  02/20/2017   Procedure: BIOPSY;  Surgeon: Alyce Jubilee, MD;  [facility];  Service: Endoscopy;;  gastric and esophageal   BIOPSY  04/13/2019   Procedure: BIOPSY;  Surgeon: Alyce Jubilee, MD;  [facility];  Service: Endoscopy;;  colon   COLONOSCOPY  DEC 2015 [facility]   [facility]   COLONOSCOPY WITH PROPOFOL  N/A 04/13/2019   Procedure: COLONOSCOPY WITH PROPOFOL ;  Surgeon: Alyce Jubilee, MD;  [facility];  Service: Endoscopy;  Laterality: N/A;  8:30am   ESOPHAGOGASTRODUODENOSCOPY N/A 02/20/2017   Procedure: ESOPHAGOGASTRODUODENOSCOPY (EGD);  Surgeon: Alyce Jubilee, MD;  [facility];  Service: Endoscopy;  Laterality: N/A;  215   EXCISIONAL HEMORRHOIDECTOMY     AGE 69-PAINFUL   FLEXIBLE SIGMOIDOSCOPY N/A 07/15/2015   Procedure: FLEXIBLE SIGMOIDOSCOPY;  Surgeon: Alyce Jubilee, MD;  [facility];  Service: Endoscopy;  Laterality: N/A;  100 - moved to 12:00 - office to notify   HEMORRHOID BANDING N/A 07/15/2015   Procedure: HEMORRHOID BANDING;  Surgeon: Alyce Jubilee, MD;  [facility];  Service: Endoscopy;  Laterality: N/A;   HERNIA REPAIR Bilateral 10/2020   INGUINAL HERNIA REPAIR Right 04/24/2021   Procedure: HERNIA REPAIR INGUINAL ADULT/ RECURRENT;  Surgeon: Awilda Bogus, MD;  Location: [facility];  Service: General;  Laterality: Right;   POLYPECTOMY  04/13/2019   Procedure: POLYPECTOMY;  Surgeon: Alyce Jubilee, MD;  [facility];  Service:  Endoscopy;;  colon   TONSILLECTOMY AND ADENOIDECTOMY     AS A CHILD   UPPER GASTROINTESTINAL ENDOSCOPY  DEC 2015 [facility]   GERD, HH    Current Outpatient Medications  Medication Sig Dispense Refill   acetaminophen  (TYLENOL ) 500 MG tablet Take 500 mg by mouth 2 (two) times daily.     acyclovir (ZOVIRAX) 400 MG tablet Take 400 mg by mouth 2 (two) times daily.     atorvastatin (LIPITOR) 40 MG tablet Take 40 mg by mouth daily.     calcium carbonate (TUMS EX) 750 MG chewable tablet Chew 1 tablet by mouth 3 (three) times daily as needed for heartburn.     Dupilumab (DUPIXENT) 300 MG/2ML SOPN Inject 300 mg every 2 weeks by subcutaneous route.     famotidine  (PEPCID ) 40 MG tablet Take 1 tablet (40 mg total) by mouth at bedtime. 90 tablet 3  fexofenadine (ALLEGRA) 180 MG tablet Take 180 mg by mouth daily.     fluticasone  (FLONASE) 50 MCG/ACT nasal spray Place 1 spray into both nostrils 2 (two) times daily at 10 AM and 5 PM.     levocetirizine (XYZAL) 5 MG tablet Take 5 mg by mouth every evening.     metoprolol tartrate (LOPRESSOR) 25 MG tablet Take 25 mg by mouth 2 (two) times daily. Hold evening dose if SBP <120     Multiple Vitamin (MULTIVITAMIN WITH MINERALS) TABS tablet Take 1 tablet by mouth daily.     naproxen (NAPROSYN) 500 MG tablet Take 500 mg by mouth 2 (two) times daily with a meal.     pantoprazole  (PROTONIX ) 40 MG tablet TAKE 1 TABLET DAILY 90 tablet 3   polyvinyl alcohol (LIQUIFILM TEARS) 1.4 % ophthalmic solution Place 1 drop into both eyes every 4 (four) hours as needed for dry eyes.     rOPINIRole (REQUIP) 0.5 MG tablet Take 0.5 mg by mouth daily at 6 (six) AM.     sildenafil  (VIAGRA ) 50 MG tablet Take 1 tablet (50 mg total) by mouth daily as needed for erectile dysfunction. (Patient taking differently: Take 75 mg by mouth daily as needed for erectile dysfunction.) 30 tablet 5   sodium chloride  (OCEAN) 0.65 % SOLN nasal spray Place 1 spray into both nostrils as needed for congestion.      tadalafil  (CIALIS ) 5 MG tablet Take 1 tablet (5 mg total) by mouth daily. (Patient taking differently: Take 10 mg by mouth daily.) 90 tablet 3   VENTOLIN HFA 108 (90 BASE) MCG/ACT inhaler Inhale 2 puffs into the lungs every 4 (four) hours as needed for wheezing or shortness of breath.      No current facility-administered medications for this visit.    Allergies as of 05/26/2024 - Review Complete 05/26/2024  Allergen Reaction Noted   Bacitracin Hives 02/28/2017   Flagyl [metronidazole] Itching 06/29/2015   Keflex [cephalexin] Other (See Comments) 10/27/2023   Neosporin original [bacitracin-neomycin-polymyxin]  11/20/2021   Neosporin [neomycin-bacitracin zn-polymyx] Hives 02/28/2017   Penicillins Itching 06/29/2015   Prednisone   11/26/2023   Sulfa antibiotics  10/27/2023    Social History   Socioeconomic History   Marital status: Divorced    Spouse name: Not on file   Number of children: Not on file   Years of education: Not on file   Highest education level: Not on file  Occupational History   Not on file  Tobacco Use   Smoking status: Former    Current packs/day: 0.00    Average packs/day: 1 pack/day for 34.0 years (34.0 ttl pk-yrs)    Types: Cigarettes    Start date: 07/15/1967    Quit date: 07/14/2001    Years since quitting: 22.8    Passive exposure: Past   Smokeless tobacco: Never   Tobacco comments:    Quit in 2002  Vaping Use   Vaping status: Never Used  Substance and Sexual Activity   Alcohol use: No    Alcohol/week: 0.0 standard drinks of alcohol   Drug use: No   Sexual activity: Yes  Other Topics Concern   Not on file  Social History Narrative   ORIGINALLY FROM [place]. USED TO LOVE TO VACATION IN [place]: [place], [place].    JOB: RESEARCH TECHNICIAN, DEGREE IN COMPUTER SCIENCE, SOLD INSURANCE.      2 SONS:  THEY LIVE IN [place] WITH EX-WIFE.   SEXUAL ORIENTATION: ATTRACTED TO MEN.   RARE ETOH OR SWEETS.  HAS A TWIN BROTHER WITH DIABETES.   Social  Drivers of Corporate investment banker Strain: Not on file  Food Insecurity: Not on file  Transportation Needs: Not on file  Physical Activity: Not on file  Stress: Not on file  Social Connections: Not on file    Review of systems General: negative for malaise, night sweats, fever, chills, weight los Neck: Negative for lumps, goiter, pain and significant neck swelling Resp: Negative for cough, wheezing, dyspnea at rest CV: Negative for chest pain, leg swelling, palpitations, orthopnea GI: denies melena, hematochezia, nausea, vomiting, diarrhea, constipation, dysphagia, odyonophagia, early satiety or unintentional weight loss.  MSK: Negative for joint pain or swelling, back pain, and muscle pain. Derm: Negative for itching or rash Psych: Denies depression, anxiety, memory loss, confusion. No homicidal or suicidal ideation.  Heme: Negative for prolonged bleeding, bruising easily, and swollen nodes. Endocrine: Negative for cold or heat intolerance, polyuria, polydipsia and goiter. Neuro: negative for tremor, gait imbalance, syncope and seizures. The remainder of the review of systems is noncontributory.  Physical Exam: BP (!) 158/85 (BP Location: Left Arm, Patient Position: Sitting, Cuff Size: Normal)   Pulse 63   Temp (!) 97.1 F (36.2 C) (Temporal)   Ht 5\' 6"  (1.676 m)   Wt 157 lb 1.6 oz (71.3 kg)   BMI 25.36 kg/m  General:   Alert and oriented. No distress noted. Pleasant and cooperative.  Head:  Normocephalic and atraumatic. Eyes:  Conjuctiva clear without scleral icterus. Mouth:  Oral mucosa pink and moist. Good dentition. No lesions. Heart: Normal rate and rhythm, s1 and s2 heart sounds present.  Lungs: Clear lung sounds in all lobes. Respirations equal and unlabored. Abdomen:  +BS, soft, non-tender and non-distended. No rebound or guarding. No HSM or masses noted. Derm: No palmar erythema or jaundice Msk:  Symmetrical without gross deformities. Normal  posture. Extremities:  Without edema. Neurologic:  Alert and  oriented x4 Psych:  Alert and cooperative. Normal mood and affect.  Invalid input(s): "6 MONTHS"   ASSESSMENT: Ronnie Patrick is a 73 y.o. male presenting today for follow up of IBS and GERD  IBS:  GERD:     PLAN:  -continue protonix  40mg  in the evenings -famotidine  40mg  30-45 minutes prior to dinner -consider xifaxan if diarrhea recurs -keep a log of abdominal pain and foods eaten prior to this to help determine source of pain  All questions were answered, patient verbalized understanding and is in agreement with plan as outlined above.    Follow Up: 6 mnoths   Ronnie Patrick L. Ethelwyn Gilbertson, MSN, APRN, AGNP-C Adult-Gerontology Nurse Practitioner [facility]

## 2024-06-02 ENCOUNTER — Other Ambulatory Visit: Payer: Self-pay

## 2024-06-02 DIAGNOSIS — N138 Other obstructive and reflux uropathy: Secondary | ICD-10-CM

## 2024-06-02 MED ORDER — TADALAFIL 5 MG PO TABS: 10.0000 mg | ORAL_TABLET | Freq: Every day | ORAL | 3 refills | Status: DC

## 2024-06-02 MED ORDER — TADALAFIL 10 MG PO TABS: 10.0000 mg | ORAL_TABLET | Freq: Every day | ORAL | 3 refills | Status: DC

## 2024-08-17 ENCOUNTER — Encounter: Payer: Self-pay | Admitting: Urology

## 2024-08-18 ENCOUNTER — Other Ambulatory Visit: Payer: Self-pay

## 2024-08-18 ENCOUNTER — Telehealth: Payer: Self-pay

## 2024-08-18 DIAGNOSIS — N138 Other obstructive and reflux uropathy: Secondary | ICD-10-CM

## 2024-08-18 DIAGNOSIS — N5201 Erectile dysfunction due to arterial insufficiency: Secondary | ICD-10-CM

## 2024-08-18 MED ORDER — TADALAFIL 10 MG PO TABS: 10.0000 mg | ORAL_TABLET | Freq: Every day | ORAL | 3 refills | Status: DC

## 2024-08-18 NOTE — Telephone Encounter (Signed)
 Called pt to confirm Rx needs pt states he wanted 5mg  tablets of Cialis  sent to two different pharmacy to get his required dose at a discounted rate pt advised we can only send a Rx to one pharmacy pt stated he wanted have 120 pills dispense at a time for cheaper coverage options stating [facility] will only cover 30 tablets within 180 days pt advised a phone call would be made to [facility] for details and a message would be sent to provider for quantity change pt stated if [facility] would not cover he is okay with having 90 tablets dispensed at [facility] pharmacy I called [facility] lvm to call back our office pt requested we reach out with how the Rx would be refilled pt was also advised that [facility] has the more affordable option pt stated he did not want [facility] use [facility] due to him knowing the pharmacist/ owner pt was advised again a message would be sent to MD and a phone call would be made to [facility] pt voiced his understanding

## 2024-09-01 NOTE — Telephone Encounter (Signed)
 Okay to change quantity of Rx

## 2024-09-02 ENCOUNTER — Telehealth: Payer: Self-pay | Admitting: Urology

## 2024-09-02 MED ORDER — TADALAFIL 10 MG PO TABS: 10.0000 mg | ORAL_TABLET | Freq: Every day | ORAL | 3 refills | Status: AC

## 2024-09-02 NOTE — Addendum Note (Signed)
 Addended by: SAMMIE EXIE HERO on: 09/02/2024 07:52 AM   Modules accepted: Orders

## 2024-09-02 NOTE — Telephone Encounter (Signed)
 Wants a call from [place], said it is too much to explain and he needs her help

## 2024-09-03 ENCOUNTER — Telehealth: Payer: Self-pay

## 2024-09-03 ENCOUNTER — Other Ambulatory Visit: Payer: Self-pay

## 2024-09-03 NOTE — Telephone Encounter (Signed)
 I spoke with patient, he had complaints regarding a recent cialis  rx.  Patient complaints were brought to mangers attention.  She will reach out and follow up with patient regarding complaints.

## 2024-09-03 NOTE — Telephone Encounter (Signed)
 Left a voicemail with my call back number and office fax if he is calling for his medication renewal form.

## 2024-09-03 NOTE — Telephone Encounter (Signed)
 Hargis from [facility] specialty pharmacy called to get clinical information on how often patient see cardiology and how often do the provider checks pt Kidney and liver function. [facility] is also requesting a letter of medical necessity for the pt's medication.

## 2024-09-03 NOTE — Telephone Encounter (Signed)
 FYI

## 2024-09-04 NOTE — Telephone Encounter (Signed)
 Hargis from pharmacy calling about RX [phone]

## 2024-09-04 NOTE — Telephone Encounter (Signed)
 Please see below.

## 2024-09-04 NOTE — Telephone Encounter (Signed)
 Attempted to call [facility] stayed on hold for over 3 minutes lvm to c/b Monday  and leave detailed message regarding pt

## 2024-09-07 ENCOUNTER — Telehealth: Payer: Self-pay

## 2024-09-07 NOTE — Telephone Encounter (Signed)
 Pharmacist stated they received clinical information for cialis  10 mg pharmacist wanted to clarify that pt is to take sildenafil  and Cialis  per OV notes pt is to take Cialis  for BPH and sildenafil  for ED pharmacist confirmed allergy  list and was advised no new allergies on file pharmacist voiced her understanding

## 2024-10-27 ENCOUNTER — Telehealth: Payer: Self-pay

## 2024-10-27 NOTE — Telephone Encounter (Signed)
 Called [facility] to get needed forms faxed to our office to be completed also called pt to ask if he wanted the form to be completed pt stated he does need the form filled out because the medication is expensive without it pt advised form will be filled out and faxed when MD Nieves returns to the office pt voiced his understanding

## 2024-10-27 NOTE — Telephone Encounter (Signed)
-----   Message from Nurse [facility] C sent at 10/26/2024 11:31 AM EST ----- Possible duplicate

## 2024-11-30 ENCOUNTER — Ambulatory Visit: Payer: Medicare HMO | Admitting: Urology

## 2024-11-30 DIAGNOSIS — R350 Frequency of micturition: Secondary | ICD-10-CM

## 2024-11-30 DIAGNOSIS — N138 Other obstructive and reflux uropathy: Secondary | ICD-10-CM

## 2024-11-30 LAB — URINALYSIS, ROUTINE W REFLEX MICROSCOPIC
Bilirubin, UA: NEGATIVE
Glucose, UA: NEGATIVE
Ketones, UA: NEGATIVE
Leukocytes,UA: NEGATIVE
Nitrite, UA: NEGATIVE
Protein,UA: NEGATIVE
RBC, UA: NEGATIVE
Specific Gravity, UA: <1.005 — ABNORMAL LOW (ref 1.005–1.030)
Urobilinogen, Ur: 0.2 mg/dL (ref 0.2–1.0)
pH, UA: 7 (ref 5.0–7.5)

## 2024-11-30 NOTE — Progress Notes (Signed)
 11/30/2024 1:16 PM   Lynwood Heal 08/07/1951 [id number]  Referring provider: Tanja Kriste SAUNDERS, MD [address] [place],  [place] [address]  No chief complaint on file.   HPI:  F/u -    1) BPH-patient with symptoms of frequency day and night. Drinks 78 oz and no caffeine. Drinks up to bedtime. He does snore, but says he was checked for OSA (negative). Good stream. Void diary 12-14 x in 24 hrs. Noc x 3-5. CT a/p 08/22 with a 40 g prostate and mild BWT. PSA was 1.2 in Oct 2022. Repeat CT Apr 2023 with no BWT and small prostate. No prior GU history or surgery. No NG risk. Oct 2022 started daily tadalafil . Did well. Urinary frequency down 30-50%. Noc improved. His Dec 2023 PSA was 1.2.    2) right adrenal nodule, left renal cysts -a 2.1 cm right adrenal nodule has been noted on CT scan 21 September 2020 and a 2.5 cm left renal cyst.  It is homogenous with attenuation of approximately 60 Hounsfield units.  It was stable on follow-up CT in 2022 and CT in April 2023. CT a/p scans Feb 2024 and Aug 2024 which revealed a benign GU tract, stable 18 mm adrenal adenoma, left renal cysts. Cr 0.8 in 2024.     3) ED - takes sildenafil  boost prn. On daily pde5i as above.     Today, seen for the above. He is on brand name Cialis  5 mg daily and we boosted it to 10 mg. LUTS improved. IPSS 4-5. He has RLS. On 100 mg gabapentin .     [name] worked in [employer].      PMH: Past Medical History:  Diagnosis Date   Basal cell carcinoma (BCC)    skin cancer on scalp-been removed   Chronic pancreatitis (HCC)    Exocrine pancreatic insufficiency    Gastritis    GERD (gastroesophageal reflux disease)    Hiatal hernia    HTN (hypertension)    Hyperlipidemia    IBS (irritable bowel syndrome)    Jock itch    LAMISIL/TERBINAFINE REQUIRED   Lyme disease JUN 2016    Surgical History: Past Surgical History:  Procedure Laterality Date   BIOPSY  02/20/2017   Procedure: BIOPSY;  Surgeon: Margo LITTIE Haddock, MD;   [facility];  Service: Endoscopy;;  gastric and esophageal   BIOPSY  04/13/2019   Procedure: BIOPSY;  Surgeon: Haddock Margo LITTIE, MD;  [facility];  Service: Endoscopy;;  colon   COLONOSCOPY  DEC 2015 [facility]   [facility]   COLONOSCOPY WITH PROPOFOL  N/A 04/13/2019   Procedure: COLONOSCOPY WITH PROPOFOL ;  Surgeon: Haddock Margo LITTIE, MD;  [facility];  Service: Endoscopy;  Laterality: N/A;  8:30am   ESOPHAGOGASTRODUODENOSCOPY N/A 02/20/2017   Procedure: ESOPHAGOGASTRODUODENOSCOPY (EGD);  Surgeon: Margo LITTIE Haddock, MD;  [facility];  Service: Endoscopy;  Laterality: N/A;  215   EXCISIONAL HEMORRHOIDECTOMY     AGE 23-PAINFUL   FLEXIBLE SIGMOIDOSCOPY N/A 07/15/2015   Procedure: FLEXIBLE SIGMOIDOSCOPY;  Surgeon: Margo LITTIE Haddock, MD;  [facility];  Service: Endoscopy;  Laterality: N/A;  100 - moved to 12:00 - office to notify   HEMORRHOID BANDING N/A 07/15/2015   Procedure: HEMORRHOID BANDING;  Surgeon: Margo LITTIE Haddock, MD;  [facility];  Service: Endoscopy;  Laterality: N/A;   HERNIA REPAIR Bilateral 10/2020   INGUINAL HERNIA REPAIR Right 04/24/2021   Procedure: HERNIA REPAIR INGUINAL ADULT/ RECURRENT;  Surgeon: Kallie Manuelita BROCKS, MD;  [facility]: [facility];  Service: General;  Laterality: Right;   POLYPECTOMY  04/13/2019   Procedure: POLYPECTOMY;  Surgeon: Harvey Margo CROME, MD;  [facility];  Service: Endoscopy;;  colon   TONSILLECTOMY AND ADENOIDECTOMY     AS A CHILD   UPPER GASTROINTESTINAL ENDOSCOPY  DEC 2015 [facility]   GERD, HH    Home Medications:  Allergies as of 11/30/2024       Reactions   Bacitracin Hives   Flagyl [metronidazole] Itching   Keflex [cephalexin] Other (See Comments)   Neosporin Original [bacitracin-neomycin-polymyxin]    Other reaction(s): Unknown   Neosporin [neomycin-bacitracin Zn-polymyx] Hives   Prednisone     Adverse reactions, patient had constipation/flare of GERD   Sulfa Antibiotics          Medication List        Accurate as of November 30, 2024  1:16 PM. If you have any questions, ask your nurse or doctor.          rOPINIRole 0.5 MG tablet Commonly known as: REQUIP Take 0.5 mg by mouth daily at 6 (six) AM. The timing of this medication is very important.   acetaminophen  500 MG tablet Commonly known as: TYLENOL  Take 500 mg by mouth 2 (two) times daily.   acyclovir 400 MG tablet Commonly known as: ZOVIRAX Take 400 mg by mouth 2 (two) times daily.   artificial tears ophthalmic solution Place 1 drop into both eyes every 4 (four) hours as needed for dry eyes.   atorvastatin 40 MG tablet Commonly known as: LIPITOR Take 40 mg by mouth daily.   calcium carbonate 750 MG chewable tablet Commonly known as: TUMS EX Chew 1 tablet by mouth 3 (three) times daily as needed for heartburn.   Dupixent 300 MG/2ML Soaj Generic drug: Dupilumab Inject 300 mg every 2 weeks by subcutaneous route.   famotidine  40 MG tablet Commonly known as: PEPCID  Take 1 tablet (40 mg total) by mouth at bedtime.   fexofenadine 180 MG tablet Commonly known as: ALLEGRA Take 180 mg by mouth daily.   fluticasone  50 MCG/ACT nasal spray Commonly known as: FLONASE Place 1 spray into both nostrils 2 (two) times daily at 10 AM and 5 PM.   levocetirizine 5 MG tablet Commonly known as: XYZAL Take 5 mg by mouth every evening.   metoprolol tartrate 25 MG tablet Commonly known as: LOPRESSOR Take 25 mg by mouth 2 (two) times daily. Hold evening dose if SBP <120   multivitamin with minerals Tabs tablet Take 1 tablet by mouth daily.   naproxen 500 MG tablet Commonly known as: NAPROSYN Take 500 mg by mouth 2 (two) times daily with a meal.   pantoprazole  40 MG tablet Commonly known as: PROTONIX  TAKE 1 TABLET DAILY   sildenafil  50 MG tablet Commonly known as: VIAGRA  Take 1 tablet (50 mg total) by mouth daily as needed for erectile dysfunction. What changed: how much to take   sodium  chloride 0.65 % Soln nasal spray Commonly known as: OCEAN Place 1 spray into both nostrils as needed for congestion.   tadalafil  10 MG tablet Commonly known as: CIALIS  Take 1 tablet (10 mg total) by mouth daily.   Ventolin HFA 108 (90 Base) MCG/ACT inhaler Generic drug: albuterol Inhale 2 puffs into the lungs every 4 (four) hours as needed for wheezing or shortness of breath.        Allergies: Allergies[1]  Family History: Family History  Problem Relation Age of  Onset   Allergic rhinitis Mother    Eczema Mother    Urticaria Mother    Asthma Father    Colon cancer Neg Hx    Colon polyps Neg Hx     Social History:  reports that he quit smoking about 23 years ago. His smoking use included cigarettes. He started smoking about 57 years ago. He has a 34 pack-year smoking history. He has been exposed to tobacco smoke. He has never used smokeless tobacco. He reports that he does not drink alcohol and does not use drugs.   Physical Exam: There were no vitals taken for this visit.  Constitutional:  Alert and oriented, No acute distress. HEENT: [place] AT, moist mucus membranes.  Trachea midline, no masses. Cardiovascular: No clubbing, cyanosis, or edema. Respiratory: Normal respiratory effort, no increased work of breathing. GI: Abdomen is soft, nontender, nondistended, no abdominal masses GU: No CVA tenderness Skin: No rashes, bruises or suspicious lesions. Neurologic: Grossly intact, no focal deficits, moving all 4 extremities. Psychiatric: Normal mood and affect.  Laboratory Data: Lab Results  Component Value Date   WBC 6.1 04/21/2021   HGB 15.5 04/21/2021   HCT 46.1 04/21/2021   MCV 91.3 04/21/2021   PLT 202 04/21/2021    Lab Results  Component Value Date   CREATININE 0.80 01/30/2023    No results found for: PSA  No results found for: TESTOSTERONE  No results found for: HGBA1C  Urinalysis    Component Value Date/Time   APPEARANCEUR Clear 12/02/2023 1312    GLUCOSEU Negative 12/02/2023 1312   BILIRUBINUR Negative 12/02/2023 1312   PROTEINUR Negative 12/02/2023 1312   NITRITE Negative 12/02/2023 1312   LEUKOCYTESUR Negative 12/02/2023 1312    Lab Results  Component Value Date   LABMICR Comment 12/02/2023    Pertinent Imaging: N/a  Filled out new patient assistance form.   Assessment & Plan:    BPH - continue tadalafil .   ED - stable    No follow-ups on file.  Donnice Brooks, MD  [facility] [place]  [address] [place], [place] [address] [phone]      [1]  Allergies Allergen Reactions   Bacitracin Hives   Flagyl [Metronidazole] Itching   Keflex [Cephalexin] Other (See Comments)   Neosporin Original [Bacitracin-Neomycin-Polymyxin]     Other reaction(s): Unknown   Neosporin [Neomycin-Bacitracin Zn-Polymyx] Hives   Prednisone      Adverse reactions, patient had constipation/flare of GERD   Sulfa Antibiotics

## 2024-12-01 ENCOUNTER — Ambulatory Visit: Payer: Self-pay

## 2024-12-01 LAB — PSA: Prostate Specific Ag, Serum: 1.1 ng/mL (ref 0.0–4.0)

## 2024-12-04 ENCOUNTER — Telehealth: Payer: Self-pay

## 2024-12-04 NOTE — Telephone Encounter (Signed)
 Pt called to request email that can be used to contact provider in regards to his lily care assistance program for tadalafil  pt was given my work agricultural engineer [email or web address]) pt asked could he use this email as a form of communication pt advised mychart and phone calls would only be an acceptable form of communication pt voiced his understanding and requested a mychart message be sent to form a message thread, a message was sent to pt mychart.

## 2024-12-14 ENCOUNTER — Ambulatory Visit: Payer: Medicare HMO | Admitting: Urology

## 2025-01-05 ENCOUNTER — Encounter (INDEPENDENT_AMBULATORY_CARE_PROVIDER_SITE_OTHER): Payer: Self-pay | Admitting: Gastroenterology

## 2025-01-05 ENCOUNTER — Ambulatory Visit (INDEPENDENT_AMBULATORY_CARE_PROVIDER_SITE_OTHER): Admitting: Gastroenterology

## 2025-01-05 VITALS — BP 154/84 | HR 91 | Temp 97.6°F | Ht 66.0 in | Wt 146.7 lb

## 2025-01-05 DIAGNOSIS — K219 Gastro-esophageal reflux disease without esophagitis: Secondary | ICD-10-CM | POA: Diagnosis not present

## 2025-01-05 DIAGNOSIS — K582 Mixed irritable bowel syndrome: Secondary | ICD-10-CM

## 2025-01-05 DIAGNOSIS — K589 Irritable bowel syndrome without diarrhea: Secondary | ICD-10-CM | POA: Diagnosis not present

## 2025-01-05 MED ORDER — FAMOTIDINE 40 MG PO TABS: 40.0000 mg | ORAL_TABLET | Freq: Every day | ORAL | 3 refills | Status: AC

## 2025-01-05 MED ORDER — PANTOPRAZOLE SODIUM 40 MG PO TBEC: 40.0000 mg | DELAYED_RELEASE_TABLET | Freq: Every day | ORAL | 3 refills | Status: AC

## 2025-01-05 NOTE — Progress Notes (Unsigned)
 "  Referring Provider: Couincil, Kriste SAUNDERS, MD Primary Care Physician:  Couincil, Kriste SAUNDERS, MD Primary GI Physician: Previously Dr. Eartha (Dr. Cinderella)   Chief Complaint  Patient presents with   Follow-up    Pt arrives for follow up. States things are going very well. Has been on Ibsrela  and docusate for one year. Pt does have a log of bowel movements. Pt does reports going to bathroom more in the evening than before.    HPI:   Ronnie Patrick is a 74 y.o. male with past medical history of lactose intolerance, red scrotum syndrome, HTN, HLD, GERD, hx of Lyme disease, IBS-M   Patient presenting today for:  Follow up of IBS-M and GERD  Last seen June, at that time IBS doing well, not taking docusate or ibsrela  since December. Queries if he has SIBO/SIMO, has a lot of bloating. 2-3 Bms per day, rare diarrhea or constipation. Worsening GERD with increase in tidalafil. Stopped remeron  due to side effects of impotence   Recommended continue protonix  40mg  daily, consider BID dosing if symptoms not improving, famotidine  40mg  prior to dinner, consider SIBO/SIMO testing or xifaxan if diarrhea recurs  Present: Doing well today. Off ibsrela  and docusate for 1 year. Has had a change in bowel movements, going more in the afternoon or early evening than previously. Only 1 episode of diarrhea since last visit. He is inquiring about CSU associated with IBS and inquires about correlation. 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 Having 1-3 BMs per day on average. No rectal bleeding or melena. He has lost about 11 pounds changing his diet some. Stools are soft but formed. Tries to eat a fairly clean diet. May have up to 4 BMs on a day he is more anxious though feels things are much more controlled. Has very occasional abdominal pain.   GERD is well managed on protonix  and famotidine . Takes these daily with good control of his symptoms. No dysphagia or odynophagia.  No red flag symptoms.  Patient denies melena, hematochezia, nausea, vomiting, constipation, dysphagia, odyonophagia, early satiety or weight loss.    CT A/P with contrast 01/30/23: No acute findings in the abdomen or pelvis. Specifically, no findings to explain the patient's history of lower abdominal pain. 2. Stable 17 mm right adrenal adenoma. No followup imaging is recommended. 3. Prostatomegaly. 4.  Aortic Atherosclerosis  Last Colonoscopy:04/13/19- Two 4 to 5 mm polyps at the hepatic flexure, removed with a cold snare. Resected and retrieved.serrated lesion in hepatic flexure and a tubular adenoma. Random biopsies neg. - External and internal hemorrhoids. - MILDLY Tortuous LEFT colon. - NO OBVIOUS SOURCE FOR DIARRHEA IDENTIFIED Repeat in 5 years  Last Endoscopy:02/20/17 small hiatal hernia, gastritis-negative, benign gastric polyps:  Filed Weights   01/05/25 1330  Weight: 146 lb 11.2 oz (66.5 kg)     Past Medical History:  Diagnosis Date   Basal cell carcinoma (BCC)    skin cancer on scalp-been removed   Chronic pancreatitis (HCC)    Exocrine pancreatic insufficiency    Gastritis    GERD (gastroesophageal reflux disease)    Hiatal hernia    HTN (hypertension)    Hyperlipidemia    IBS (irritable bowel syndrome)    Jock itch    LAMISIL/TERBINAFINE REQUIRED   Lyme disease JUN 2016    Past Surgical History:  Procedure Laterality Date   BIOPSY  02/20/2017   Procedure: BIOPSY;  Surgeon: Margo LITTIE Haddock, MD;  [facility];  Service: Endoscopy;;  gastric and esophageal   BIOPSY  04/13/2019   Procedure: BIOPSY;  Surgeon: Harvey Margo CROME, MD;  [facility];  Service: Endoscopy;;  colon   COLONOSCOPY  DEC 2015 [facility]   [facility]   COLONOSCOPY WITH PROPOFOL  N/A 04/13/2019   Procedure: COLONOSCOPY WITH PROPOFOL ;  Surgeon: Harvey Margo CROME, MD;  [facility];  Service: Endoscopy;  Laterality: N/A;  8:30am   ESOPHAGOGASTRODUODENOSCOPY N/A 02/20/2017   Procedure:  ESOPHAGOGASTRODUODENOSCOPY (EGD);  Surgeon: Margo CROME Harvey, MD;  [facility];  Service: Endoscopy;  Laterality: N/A;  215   EXCISIONAL HEMORRHOIDECTOMY     AGE 6-PAINFUL   FLEXIBLE SIGMOIDOSCOPY N/A 07/15/2015   Procedure: FLEXIBLE SIGMOIDOSCOPY;  Surgeon: Margo CROME Harvey, MD;  [facility];  Service: Endoscopy;  Laterality: N/A;  100 - moved to 12:00 - office to notify   HEMORRHOID BANDING N/A 07/15/2015   Procedure: HEMORRHOID BANDING;  Surgeon: Margo CROME Harvey, MD;  [facility];  Service: Endoscopy;  Laterality: N/A;   HERNIA REPAIR Bilateral 10/2020   INGUINAL HERNIA REPAIR Right 04/24/2021   Procedure: HERNIA REPAIR INGUINAL ADULT/ RECURRENT;  Surgeon: Kallie Manuelita BROCKS, MD;  [facility]: [facility];  Service: General;  Laterality: Right;   POLYPECTOMY  04/13/2019   Procedure: POLYPECTOMY;  Surgeon: Harvey Margo CROME, MD;  [facility];  Service: Endoscopy;;  colon   TONSILLECTOMY AND ADENOIDECTOMY     AS A CHILD   UPPER GASTROINTESTINAL ENDOSCOPY  DEC 2015 [facility]   GERD, HH    Current Outpatient Medications  Medication Sig Dispense Refill   acetaminophen  (TYLENOL ) 500 MG tablet Take 500 mg by mouth 2 (two) times daily.     acyclovir (ZOVIRAX) 400 MG tablet Take 400 mg by mouth 2 (two) times daily.     atorvastatin (LIPITOR) 40 MG tablet Take 40 mg by mouth daily.     calcium carbonate (TUMS EX) 750 MG chewable tablet Chew 1 tablet by mouth 3 (three) times daily as needed for heartburn.     Dupilumab (DUPIXENT) 300 MG/2ML SOPN Inject 300 mg every 2 weeks by subcutaneous route.     famotidine  (PEPCID ) 40 MG tablet Take 1 tablet (40 mg total) by mouth at bedtime. 90 tablet 3   fexofenadine (ALLEGRA) 180 MG tablet Take 180 mg by mouth daily.     fluticasone  (FLONASE) 50 MCG/ACT nasal spray Place 1 spray into both nostrils 2 (two) times daily at 10 AM and 5 PM.     gabapentin  (NEURONTIN ) 100 MG capsule Take 100 mg by mouth at bedtime.     levocetirizine  (XYZAL) 5 MG tablet Take 5 mg by mouth every evening.     Multiple Vitamin (MULTIVITAMIN WITH MINERALS) TABS tablet Take 1 tablet by mouth daily.     naproxen (NAPROSYN) 500 MG tablet Take 500 mg by mouth 2 (two) times daily with a meal.     pantoprazole  (PROTONIX ) 40 MG tablet TAKE 1 TABLET DAILY 90 tablet 3   polyvinyl alcohol (LIQUIFILM TEARS) 1.4 % ophthalmic solution Place 1 drop into both eyes every 4 (four) hours as needed for dry eyes.     sildenafil  (VIAGRA ) 50 MG tablet Take 1 tablet (50 mg total) by mouth daily as needed for erectile dysfunction. 30 tablet 5   sodium chloride  (OCEAN) 0.65 % SOLN nasal spray Place 1 spray into both nostrils as needed for congestion.     tadalafil  (CIALIS ) 10 MG tablet Take 1 tablet (10 mg total)  by mouth daily. 90 tablet 3   VENTOLIN HFA 108 (90 BASE) MCG/ACT inhaler Inhale 2 puffs into the lungs every 4 (four) hours as needed for wheezing or shortness of breath.      No current facility-administered medications for this visit.    Allergies as of 01/05/2025 - Review Complete 01/05/2025  Allergen Reaction Noted   Bacitracin Hives 02/28/2017   Flagyl [metronidazole] Itching 06/29/2015   Keflex [cephalexin] Other (See Comments) 10/27/2023   Neosporin original [bacitracin-neomycin-polymyxin]  11/20/2021   Neosporin [neomycin-bacitracin zn-polymyx] Hives 02/28/2017   Prednisone   11/26/2023   Sulfa antibiotics  10/27/2023    Social History   Socioeconomic History   Marital status: Divorced    Spouse name: Not on file   Number of children: Not on file   Years of education: Not on file   Highest education level: Not on file  Occupational History   Not on file  Tobacco Use   Smoking status: Former    Current packs/day: 0.00    Average packs/day: 1 pack/day for 34.0 years (34.0 ttl pk-yrs)    Types: Cigarettes    Start date: 07/15/1967    Quit date: 07/14/2001    Years since quitting: 23.4    Passive exposure: Past   Smokeless tobacco: Never    Tobacco comments:    Quit in 2002  Vaping Use   Vaping status: Never Used  Substance and Sexual Activity   Alcohol use: No    Alcohol/week: 0.0 standard drinks of alcohol   Drug use: No   Sexual activity: Yes  Other Topics Concern   Not on file  Social History Narrative   ORIGINALLY FROM [place]. USED TO LOVE TO VACATION IN [place]: [place], [place].    JOB: RESEARCH TECHNICIAN, DEGREE IN COMPUTER SCIENCE, SOLD INSURANCE.      2 SONS:  THEY LIVE IN [place] WITH EX-WIFE.   SEXUAL ORIENTATION: ATTRACTED TO MEN.   RARE ETOH OR SWEETS. HAS A TWIN BROTHER WITH DIABETES.   Social Drivers of Health   Tobacco Use: Medium Risk (01/05/2025)   Patient History    Smoking Tobacco Use: Former    Smokeless Tobacco Use: Never    Passive Exposure: Past  Programmer, Applications: Not on [employer] Insecurity: Not on file  Transportation Needs: Not on file  Physical Activity: Not on file  Stress: Not on file  Social Connections: Not on file  Depression (PHQ2-9): Not on file  Alcohol Screen: Not on file  Housing: Not on file  Utilities: Not on file  Health Literacy: Not on file    Review of systems General: negative for malaise, night sweats, fever, chills, weight loss Neck: Negative for lumps, goiter, pain and significant neck swelling Resp: Negative for cough, wheezing, dyspnea at rest CV: Negative for chest pain, leg swelling, palpitations, orthopnea GI: denies melena, hematochezia, nausea, vomiting, diarrhea, constipation, dysphagia, odyonophagia, early satiety or unintentional weight loss.  MSK: Negative for joint pain or swelling, back pain, and muscle pain. Derm: Negative for itching or rash Psych: Denies depression, anxiety, memory loss, confusion. No homicidal or suicidal ideation.  Heme: Negative for prolonged bleeding, bruising easily, and swollen nodes. Endocrine: Negative for cold or heat intolerance, polyuria, polydipsia and goiter. Neuro: negative for tremor, gait imbalance,  syncope and seizures. The remainder of the review of systems is noncontributory.  Physical Exam: BP (!) 154/84   Pulse 91   Temp 97.6 F (36.4 C)   Ht 5' 6 (1.676 m)   Wt  146 lb 11.2 oz (66.5 kg)   BMI 23.68 kg/m  General:   Alert and oriented. No distress noted. Pleasant and cooperative.  Head:  Normocephalic and atraumatic. Eyes:  Conjuctiva clear without scleral icterus. Mouth:  Oral mucosa pink and moist. Good dentition. No lesions. Heart: Normal rate and rhythm, s1 and s2 heart sounds present.  Lungs: Clear lung sounds in all lobes. Respirations equal and unlabored. Abdomen:  +BS, soft, non-tender and non-distended. No rebound or guarding. No HSM or masses noted. Neurologic:  Alert and  oriented x4 Psych:  Alert and cooperative. Normal mood and affect.  Invalid input(s): 6 MONTHS   ASSESSMENT: Shavon Zenz is a 74 y.o. male presenting today for follow up of IBS and GERD   GERD: well managed with protonix  40mg  daily and famotidine  40mg  at bedtime. No breakthrough or dysphagia. Will continue with current regimen for now  IBS: currently well managed with more lifestyle/dietary changes. Symptoms have appeared strongly driven by anxiety in the past. Has not been on stool softener or Ibsrela  for 1 year now. He is pleased with how well his IBS is currently doing. For now, recommend continuing with good stress management, dietary changes.   At this time would like to push follow up to 1 year vs. 6 months which I think is completely reasonable, he will make me aware if he has any new or worsening issues.   PLAN:  -continue protonix  40mg  daily -famotidine  40mg  at bedtime -continue with good stress management and dietary changes  All questions were answered, patient verbalized understanding and is in agreement with plan as outlined above.   Follow Up: 1 year follow up  Zonnique Norkus L. Danaly Bari, MSN, APRN, AGNP-C Adult-Gerontology Nurse Practitioner [facility]  "

## 2025-01-05 NOTE — Patient Instructions (Signed)
-  continue protonix  40mg  daily -famotidine  40mg  at bedtime -continue with good stress management   Follow up 1 year

## 2025-01-07 ENCOUNTER — Encounter (INDEPENDENT_AMBULATORY_CARE_PROVIDER_SITE_OTHER): Payer: Self-pay

## 2025-01-13 ENCOUNTER — Other Ambulatory Visit: Payer: Self-pay

## 2025-01-13 DIAGNOSIS — N401 Enlarged prostate with lower urinary tract symptoms: Secondary | ICD-10-CM

## 2025-06-09 ENCOUNTER — Ambulatory Visit: Admitting: Dermatology

## 2025-10-04 ENCOUNTER — Ambulatory Visit: Admitting: Urology

## 2025-11-01 ENCOUNTER — Ambulatory Visit: Admitting: Urology

## 2025-12-06 ENCOUNTER — Ambulatory Visit: Admitting: Urology
